# Patient Record
Sex: Female | Born: 1950
Health system: Southern US, Community
[De-identification: ages and names within clinical notes are randomized; demographics above are authoritative.]

## PROBLEM LIST (undated history)

## (undated) DIAGNOSIS — H02889 Meibomian gland dysfunction of unspecified eye, unspecified eyelid: Secondary | ICD-10-CM

## (undated) DIAGNOSIS — K219 Gastro-esophageal reflux disease without esophagitis: Secondary | ICD-10-CM

## (undated) DIAGNOSIS — R011 Cardiac murmur, unspecified: Secondary | ICD-10-CM

## (undated) DIAGNOSIS — R51 Headache: Secondary | ICD-10-CM

## (undated) DIAGNOSIS — M26629 Arthralgia of temporomandibular joint, unspecified side: Secondary | ICD-10-CM

## (undated) DIAGNOSIS — H348192 Central retinal vein occlusion, unspecified eye, stable: Secondary | ICD-10-CM

## (undated) DIAGNOSIS — M1990 Unspecified osteoarthritis, unspecified site: Secondary | ICD-10-CM

## (undated) DIAGNOSIS — R519 Headache, unspecified: Secondary | ICD-10-CM

## (undated) DIAGNOSIS — G4733 Obstructive sleep apnea (adult) (pediatric): Secondary | ICD-10-CM

## (undated) DIAGNOSIS — H04129 Dry eye syndrome of unspecified lacrimal gland: Secondary | ICD-10-CM

## (undated) DIAGNOSIS — E039 Hypothyroidism, unspecified: Secondary | ICD-10-CM

## (undated) DIAGNOSIS — H905 Unspecified sensorineural hearing loss: Secondary | ICD-10-CM

## (undated) DIAGNOSIS — J329 Chronic sinusitis, unspecified: Secondary | ICD-10-CM

## (undated) HISTORY — DX: Obstructive sleep apnea (adult) (pediatric): G47.33

## (undated) HISTORY — PX: CYST EXCISION: SHX5701

## (undated) HISTORY — DX: Meibomian gland dysfunction of unspecified eye, unspecified eyelid: H02.889

## (undated) HISTORY — PX: TUBAL LIGATION: SHX77

## (undated) HISTORY — PX: HAMMER TOE SURGERY: SHX385

## (undated) HISTORY — DX: Gastro-esophageal reflux disease without esophagitis: K21.9

## (undated) HISTORY — PX: COLONOSCOPY: SHX174

## (undated) HISTORY — DX: Arthralgia of temporomandibular joint, unspecified side: M26.629

## (undated) HISTORY — DX: Cardiac murmur, unspecified: R01.1

## (undated) HISTORY — PX: EYE SURGERY: SHX253

## (undated) HISTORY — DX: Unspecified sensorineural hearing loss: H90.5

## (undated) HISTORY — DX: Central retinal vein occlusion, unspecified eye, stable: H34.8192

## (undated) HISTORY — PX: CHOLECYSTECTOMY: SHX55

## (undated) HISTORY — DX: Dry eye syndrome of unspecified lacrimal gland: H04.129

## (undated) HISTORY — PX: NASAL/SINUS ENDOSCOPY: SHX288

---

## 1989-11-14 HISTORY — PX: OTHER SURGICAL HISTORY: SHX169

## 1999-11-15 HISTORY — PX: CARPAL TUNNEL RELEASE: SHX101

## 2011-04-25 ENCOUNTER — Encounter: Payer: 59 | Attending: Physical Medicine & Rehabilitation

## 2011-04-25 ENCOUNTER — Ambulatory Visit (HOSPITAL_BASED_OUTPATIENT_CLINIC_OR_DEPARTMENT_OTHER): Payer: 59 | Admitting: Physical Medicine & Rehabilitation

## 2011-04-25 DIAGNOSIS — J329 Chronic sinusitis, unspecified: Secondary | ICD-10-CM | POA: Insufficient documentation

## 2011-04-25 DIAGNOSIS — R209 Unspecified disturbances of skin sensation: Secondary | ICD-10-CM | POA: Insufficient documentation

## 2011-04-25 DIAGNOSIS — E039 Hypothyroidism, unspecified: Secondary | ICD-10-CM | POA: Insufficient documentation

## 2011-04-25 DIAGNOSIS — Z79899 Other long term (current) drug therapy: Secondary | ICD-10-CM | POA: Insufficient documentation

## 2011-04-25 DIAGNOSIS — Z9089 Acquired absence of other organs: Secondary | ICD-10-CM | POA: Insufficient documentation

## 2011-04-25 DIAGNOSIS — R262 Difficulty in walking, not elsewhere classified: Secondary | ICD-10-CM | POA: Insufficient documentation

## 2011-06-28 ENCOUNTER — Encounter: Payer: 59 | Attending: Physical Medicine & Rehabilitation

## 2011-06-28 ENCOUNTER — Ambulatory Visit (HOSPITAL_BASED_OUTPATIENT_CLINIC_OR_DEPARTMENT_OTHER): Payer: 59 | Admitting: Physical Medicine & Rehabilitation

## 2011-06-28 DIAGNOSIS — G5610 Other lesions of median nerve, unspecified upper limb: Secondary | ICD-10-CM

## 2011-06-28 DIAGNOSIS — R262 Difficulty in walking, not elsewhere classified: Secondary | ICD-10-CM | POA: Insufficient documentation

## 2011-06-28 DIAGNOSIS — Z9089 Acquired absence of other organs: Secondary | ICD-10-CM | POA: Insufficient documentation

## 2011-06-28 DIAGNOSIS — Z79899 Other long term (current) drug therapy: Secondary | ICD-10-CM | POA: Insufficient documentation

## 2011-06-28 DIAGNOSIS — E039 Hypothyroidism, unspecified: Secondary | ICD-10-CM | POA: Insufficient documentation

## 2011-06-28 DIAGNOSIS — J329 Chronic sinusitis, unspecified: Secondary | ICD-10-CM | POA: Insufficient documentation

## 2011-06-28 DIAGNOSIS — G5620 Lesion of ulnar nerve, unspecified upper limb: Secondary | ICD-10-CM

## 2011-06-28 DIAGNOSIS — R209 Unspecified disturbances of skin sensation: Secondary | ICD-10-CM | POA: Insufficient documentation

## 2016-01-19 DIAGNOSIS — G243 Spasmodic torticollis: Secondary | ICD-10-CM | POA: Diagnosis not present

## 2016-01-19 DIAGNOSIS — M47812 Spondylosis without myelopathy or radiculopathy, cervical region: Secondary | ICD-10-CM | POA: Diagnosis not present

## 2016-01-19 DIAGNOSIS — M9981 Other biomechanical lesions of cervical region: Secondary | ICD-10-CM | POA: Diagnosis not present

## 2016-01-19 DIAGNOSIS — M5030 Other cervical disc degeneration, unspecified cervical region: Secondary | ICD-10-CM | POA: Diagnosis not present

## 2016-01-25 DIAGNOSIS — G243 Spasmodic torticollis: Secondary | ICD-10-CM | POA: Diagnosis not present

## 2016-01-25 DIAGNOSIS — M5030 Other cervical disc degeneration, unspecified cervical region: Secondary | ICD-10-CM | POA: Diagnosis not present

## 2016-01-25 DIAGNOSIS — Z833 Family history of diabetes mellitus: Secondary | ICD-10-CM | POA: Diagnosis not present

## 2016-01-25 DIAGNOSIS — K219 Gastro-esophageal reflux disease without esophagitis: Secondary | ICD-10-CM | POA: Diagnosis not present

## 2016-01-25 DIAGNOSIS — Z79899 Other long term (current) drug therapy: Secondary | ICD-10-CM | POA: Diagnosis not present

## 2016-01-25 DIAGNOSIS — M47812 Spondylosis without myelopathy or radiculopathy, cervical region: Secondary | ICD-10-CM | POA: Diagnosis not present

## 2016-01-25 DIAGNOSIS — M9981 Other biomechanical lesions of cervical region: Secondary | ICD-10-CM | POA: Diagnosis not present

## 2016-01-25 DIAGNOSIS — Z7982 Long term (current) use of aspirin: Secondary | ICD-10-CM | POA: Diagnosis not present

## 2016-01-25 DIAGNOSIS — Z823 Family history of stroke: Secondary | ICD-10-CM | POA: Diagnosis not present

## 2016-01-25 DIAGNOSIS — Z883 Allergy status to other anti-infective agents status: Secondary | ICD-10-CM | POA: Diagnosis not present

## 2016-02-18 DIAGNOSIS — M5030 Other cervical disc degeneration, unspecified cervical region: Secondary | ICD-10-CM | POA: Diagnosis not present

## 2016-02-18 DIAGNOSIS — M47812 Spondylosis without myelopathy or radiculopathy, cervical region: Secondary | ICD-10-CM | POA: Diagnosis not present

## 2016-03-23 DIAGNOSIS — M7062 Trochanteric bursitis, left hip: Secondary | ICD-10-CM | POA: Diagnosis not present

## 2016-03-23 DIAGNOSIS — M3130 Wegener's granulomatosis without renal involvement: Secondary | ICD-10-CM | POA: Diagnosis not present

## 2016-03-23 DIAGNOSIS — Z23 Encounter for immunization: Secondary | ICD-10-CM | POA: Diagnosis not present

## 2016-03-23 DIAGNOSIS — M7061 Trochanteric bursitis, right hip: Secondary | ICD-10-CM | POA: Diagnosis not present

## 2016-04-22 DIAGNOSIS — M7061 Trochanteric bursitis, right hip: Secondary | ICD-10-CM | POA: Diagnosis not present

## 2016-04-22 DIAGNOSIS — M7062 Trochanteric bursitis, left hip: Secondary | ICD-10-CM | POA: Diagnosis not present

## 2016-04-22 DIAGNOSIS — M47811 Spondylosis without myelopathy or radiculopathy, occipito-atlanto-axial region: Secondary | ICD-10-CM | POA: Diagnosis not present

## 2016-04-25 DIAGNOSIS — M7061 Trochanteric bursitis, right hip: Secondary | ICD-10-CM | POA: Diagnosis not present

## 2016-04-25 DIAGNOSIS — M4306 Spondylolysis, lumbar region: Secondary | ICD-10-CM | POA: Diagnosis not present

## 2016-04-25 DIAGNOSIS — M7062 Trochanteric bursitis, left hip: Secondary | ICD-10-CM | POA: Diagnosis not present

## 2016-04-27 DIAGNOSIS — M7062 Trochanteric bursitis, left hip: Secondary | ICD-10-CM | POA: Diagnosis not present

## 2016-04-27 DIAGNOSIS — M7061 Trochanteric bursitis, right hip: Secondary | ICD-10-CM | POA: Diagnosis not present

## 2016-04-27 DIAGNOSIS — M4306 Spondylolysis, lumbar region: Secondary | ICD-10-CM | POA: Diagnosis not present

## 2016-04-28 DIAGNOSIS — E039 Hypothyroidism, unspecified: Secondary | ICD-10-CM | POA: Diagnosis not present

## 2016-04-28 DIAGNOSIS — J310 Chronic rhinitis: Secondary | ICD-10-CM | POA: Diagnosis not present

## 2016-04-28 DIAGNOSIS — J328 Other chronic sinusitis: Secondary | ICD-10-CM | POA: Diagnosis not present

## 2016-04-28 DIAGNOSIS — Z881 Allergy status to other antibiotic agents status: Secondary | ICD-10-CM | POA: Diagnosis not present

## 2016-04-28 DIAGNOSIS — J329 Chronic sinusitis, unspecified: Secondary | ICD-10-CM | POA: Diagnosis not present

## 2016-04-28 DIAGNOSIS — Z79899 Other long term (current) drug therapy: Secondary | ICD-10-CM | POA: Diagnosis not present

## 2016-04-28 DIAGNOSIS — Z7982 Long term (current) use of aspirin: Secondary | ICD-10-CM | POA: Diagnosis not present

## 2016-04-28 DIAGNOSIS — K219 Gastro-esophageal reflux disease without esophagitis: Secondary | ICD-10-CM | POA: Diagnosis not present

## 2016-05-02 DIAGNOSIS — M4306 Spondylolysis, lumbar region: Secondary | ICD-10-CM | POA: Diagnosis not present

## 2016-05-02 DIAGNOSIS — M7062 Trochanteric bursitis, left hip: Secondary | ICD-10-CM | POA: Diagnosis not present

## 2016-05-02 DIAGNOSIS — M7061 Trochanteric bursitis, right hip: Secondary | ICD-10-CM | POA: Diagnosis not present

## 2016-05-05 DIAGNOSIS — M4306 Spondylolysis, lumbar region: Secondary | ICD-10-CM | POA: Diagnosis not present

## 2016-05-05 DIAGNOSIS — M7061 Trochanteric bursitis, right hip: Secondary | ICD-10-CM | POA: Diagnosis not present

## 2016-05-05 DIAGNOSIS — M7062 Trochanteric bursitis, left hip: Secondary | ICD-10-CM | POA: Diagnosis not present

## 2016-05-09 DIAGNOSIS — M4306 Spondylolysis, lumbar region: Secondary | ICD-10-CM | POA: Diagnosis not present

## 2016-05-09 DIAGNOSIS — M7061 Trochanteric bursitis, right hip: Secondary | ICD-10-CM | POA: Diagnosis not present

## 2016-05-09 DIAGNOSIS — M7062 Trochanteric bursitis, left hip: Secondary | ICD-10-CM | POA: Diagnosis not present

## 2016-05-12 DIAGNOSIS — M4306 Spondylolysis, lumbar region: Secondary | ICD-10-CM | POA: Diagnosis not present

## 2016-05-12 DIAGNOSIS — M7061 Trochanteric bursitis, right hip: Secondary | ICD-10-CM | POA: Diagnosis not present

## 2016-05-12 DIAGNOSIS — M7062 Trochanteric bursitis, left hip: Secondary | ICD-10-CM | POA: Diagnosis not present

## 2016-05-16 DIAGNOSIS — M4306 Spondylolysis, lumbar region: Secondary | ICD-10-CM | POA: Diagnosis not present

## 2016-05-16 DIAGNOSIS — M7061 Trochanteric bursitis, right hip: Secondary | ICD-10-CM | POA: Diagnosis not present

## 2016-05-16 DIAGNOSIS — M7062 Trochanteric bursitis, left hip: Secondary | ICD-10-CM | POA: Diagnosis not present

## 2016-05-19 DIAGNOSIS — M7062 Trochanteric bursitis, left hip: Secondary | ICD-10-CM | POA: Diagnosis not present

## 2016-05-19 DIAGNOSIS — M4306 Spondylolysis, lumbar region: Secondary | ICD-10-CM | POA: Diagnosis not present

## 2016-05-19 DIAGNOSIS — M7061 Trochanteric bursitis, right hip: Secondary | ICD-10-CM | POA: Diagnosis not present

## 2016-05-23 DIAGNOSIS — M7062 Trochanteric bursitis, left hip: Secondary | ICD-10-CM | POA: Diagnosis not present

## 2016-05-23 DIAGNOSIS — M4306 Spondylolysis, lumbar region: Secondary | ICD-10-CM | POA: Diagnosis not present

## 2016-05-23 DIAGNOSIS — M7061 Trochanteric bursitis, right hip: Secondary | ICD-10-CM | POA: Diagnosis not present

## 2016-05-26 DIAGNOSIS — M4306 Spondylolysis, lumbar region: Secondary | ICD-10-CM | POA: Diagnosis not present

## 2016-05-26 DIAGNOSIS — M7062 Trochanteric bursitis, left hip: Secondary | ICD-10-CM | POA: Diagnosis not present

## 2016-05-26 DIAGNOSIS — M7061 Trochanteric bursitis, right hip: Secondary | ICD-10-CM | POA: Diagnosis not present

## 2016-05-31 DIAGNOSIS — M7062 Trochanteric bursitis, left hip: Secondary | ICD-10-CM | POA: Diagnosis not present

## 2016-05-31 DIAGNOSIS — M4306 Spondylolysis, lumbar region: Secondary | ICD-10-CM | POA: Diagnosis not present

## 2016-05-31 DIAGNOSIS — M7061 Trochanteric bursitis, right hip: Secondary | ICD-10-CM | POA: Diagnosis not present

## 2016-06-03 DIAGNOSIS — M7061 Trochanteric bursitis, right hip: Secondary | ICD-10-CM | POA: Diagnosis not present

## 2016-06-03 DIAGNOSIS — M7062 Trochanteric bursitis, left hip: Secondary | ICD-10-CM | POA: Diagnosis not present

## 2016-06-03 DIAGNOSIS — M4306 Spondylolysis, lumbar region: Secondary | ICD-10-CM | POA: Diagnosis not present

## 2016-06-20 DIAGNOSIS — E039 Hypothyroidism, unspecified: Secondary | ICD-10-CM | POA: Diagnosis not present

## 2016-06-20 DIAGNOSIS — E782 Mixed hyperlipidemia: Secondary | ICD-10-CM | POA: Diagnosis not present

## 2016-06-20 DIAGNOSIS — K210 Gastro-esophageal reflux disease with esophagitis: Secondary | ICD-10-CM | POA: Diagnosis not present

## 2016-06-23 DIAGNOSIS — Z6825 Body mass index (BMI) 25.0-25.9, adult: Secondary | ICD-10-CM | POA: Diagnosis not present

## 2016-06-23 DIAGNOSIS — M3130 Wegener's granulomatosis without renal involvement: Secondary | ICD-10-CM | POA: Diagnosis not present

## 2016-06-23 DIAGNOSIS — M7061 Trochanteric bursitis, right hip: Secondary | ICD-10-CM | POA: Diagnosis not present

## 2016-06-23 DIAGNOSIS — E039 Hypothyroidism, unspecified: Secondary | ICD-10-CM | POA: Diagnosis not present

## 2016-06-23 DIAGNOSIS — Z0001 Encounter for general adult medical examination with abnormal findings: Secondary | ICD-10-CM | POA: Diagnosis not present

## 2016-06-23 DIAGNOSIS — Z1212 Encounter for screening for malignant neoplasm of rectum: Secondary | ICD-10-CM | POA: Diagnosis not present

## 2016-06-23 DIAGNOSIS — R300 Dysuria: Secondary | ICD-10-CM | POA: Diagnosis not present

## 2016-06-23 DIAGNOSIS — M7062 Trochanteric bursitis, left hip: Secondary | ICD-10-CM | POA: Diagnosis not present

## 2016-08-17 ENCOUNTER — Ambulatory Visit (INDEPENDENT_AMBULATORY_CARE_PROVIDER_SITE_OTHER): Payer: Medicare Other

## 2016-08-17 DIAGNOSIS — Z23 Encounter for immunization: Secondary | ICD-10-CM | POA: Diagnosis not present

## 2016-08-24 DIAGNOSIS — M7061 Trochanteric bursitis, right hip: Secondary | ICD-10-CM | POA: Diagnosis not present

## 2016-08-24 DIAGNOSIS — M7062 Trochanteric bursitis, left hip: Secondary | ICD-10-CM | POA: Diagnosis not present

## 2016-08-24 DIAGNOSIS — Z6825 Body mass index (BMI) 25.0-25.9, adult: Secondary | ICD-10-CM | POA: Diagnosis not present

## 2016-08-30 DIAGNOSIS — H11441 Conjunctival cysts, right eye: Secondary | ICD-10-CM | POA: Diagnosis not present

## 2016-09-26 DIAGNOSIS — Z01419 Encounter for gynecological examination (general) (routine) without abnormal findings: Secondary | ICD-10-CM | POA: Diagnosis not present

## 2016-09-26 DIAGNOSIS — Z6825 Body mass index (BMI) 25.0-25.9, adult: Secondary | ICD-10-CM | POA: Diagnosis not present

## 2016-10-20 DIAGNOSIS — Z1231 Encounter for screening mammogram for malignant neoplasm of breast: Secondary | ICD-10-CM | POA: Diagnosis not present

## 2016-10-24 DIAGNOSIS — M7062 Trochanteric bursitis, left hip: Secondary | ICD-10-CM | POA: Diagnosis not present

## 2016-10-24 DIAGNOSIS — Z6825 Body mass index (BMI) 25.0-25.9, adult: Secondary | ICD-10-CM | POA: Diagnosis not present

## 2016-10-24 DIAGNOSIS — E039 Hypothyroidism, unspecified: Secondary | ICD-10-CM | POA: Diagnosis not present

## 2016-10-24 DIAGNOSIS — M3130 Wegener's granulomatosis without renal involvement: Secondary | ICD-10-CM | POA: Diagnosis not present

## 2016-10-24 DIAGNOSIS — Z23 Encounter for immunization: Secondary | ICD-10-CM | POA: Diagnosis not present

## 2016-10-24 DIAGNOSIS — Z1389 Encounter for screening for other disorder: Secondary | ICD-10-CM | POA: Diagnosis not present

## 2016-10-24 DIAGNOSIS — M7061 Trochanteric bursitis, right hip: Secondary | ICD-10-CM | POA: Diagnosis not present

## 2016-10-24 DIAGNOSIS — E782 Mixed hyperlipidemia: Secondary | ICD-10-CM | POA: Diagnosis not present

## 2016-10-27 DIAGNOSIS — J300 Vasomotor rhinitis: Secondary | ICD-10-CM | POA: Diagnosis not present

## 2016-10-27 DIAGNOSIS — Z79899 Other long term (current) drug therapy: Secondary | ICD-10-CM | POA: Diagnosis not present

## 2016-10-27 DIAGNOSIS — R0982 Postnasal drip: Secondary | ICD-10-CM | POA: Diagnosis not present

## 2016-10-27 DIAGNOSIS — K219 Gastro-esophageal reflux disease without esophagitis: Secondary | ICD-10-CM | POA: Diagnosis not present

## 2016-10-27 DIAGNOSIS — J328 Other chronic sinusitis: Secondary | ICD-10-CM | POA: Diagnosis not present

## 2016-10-27 DIAGNOSIS — Z7982 Long term (current) use of aspirin: Secondary | ICD-10-CM | POA: Diagnosis not present

## 2016-10-27 DIAGNOSIS — J310 Chronic rhinitis: Secondary | ICD-10-CM | POA: Diagnosis not present

## 2016-11-15 DIAGNOSIS — H5213 Myopia, bilateral: Secondary | ICD-10-CM | POA: Diagnosis not present

## 2016-11-15 DIAGNOSIS — H2513 Age-related nuclear cataract, bilateral: Secondary | ICD-10-CM | POA: Diagnosis not present

## 2016-11-15 DIAGNOSIS — H52203 Unspecified astigmatism, bilateral: Secondary | ICD-10-CM | POA: Diagnosis not present

## 2016-11-15 DIAGNOSIS — H524 Presbyopia: Secondary | ICD-10-CM | POA: Diagnosis not present

## 2016-11-15 DIAGNOSIS — H348312 Tributary (branch) retinal vein occlusion, right eye, stable: Secondary | ICD-10-CM | POA: Diagnosis not present

## 2016-11-16 DIAGNOSIS — H35041 Retinal micro-aneurysms, unspecified, right eye: Secondary | ICD-10-CM | POA: Diagnosis not present

## 2016-11-16 DIAGNOSIS — H35351 Cystoid macular degeneration, right eye: Secondary | ICD-10-CM | POA: Diagnosis not present

## 2016-11-16 DIAGNOSIS — H3561 Retinal hemorrhage, right eye: Secondary | ICD-10-CM | POA: Diagnosis not present

## 2016-11-16 DIAGNOSIS — H34811 Central retinal vein occlusion, right eye, with macular edema: Secondary | ICD-10-CM | POA: Diagnosis not present

## 2016-12-19 DIAGNOSIS — H34811 Central retinal vein occlusion, right eye, with macular edema: Secondary | ICD-10-CM | POA: Diagnosis not present

## 2017-01-23 DIAGNOSIS — H34811 Central retinal vein occlusion, right eye, with macular edema: Secondary | ICD-10-CM | POA: Diagnosis not present

## 2017-01-26 DIAGNOSIS — E0590 Thyrotoxicosis, unspecified without thyrotoxic crisis or storm: Secondary | ICD-10-CM | POA: Diagnosis not present

## 2017-01-26 DIAGNOSIS — Z79899 Other long term (current) drug therapy: Secondary | ICD-10-CM | POA: Diagnosis not present

## 2017-01-26 DIAGNOSIS — M810 Age-related osteoporosis without current pathological fracture: Secondary | ICD-10-CM | POA: Diagnosis not present

## 2017-01-26 DIAGNOSIS — Z780 Asymptomatic menopausal state: Secondary | ICD-10-CM | POA: Diagnosis not present

## 2017-01-26 DIAGNOSIS — Z79890 Hormone replacement therapy (postmenopausal): Secondary | ICD-10-CM | POA: Diagnosis not present

## 2017-01-26 DIAGNOSIS — K219 Gastro-esophageal reflux disease without esophagitis: Secondary | ICD-10-CM | POA: Diagnosis not present

## 2017-01-26 DIAGNOSIS — Z7982 Long term (current) use of aspirin: Secondary | ICD-10-CM | POA: Diagnosis not present

## 2017-01-26 DIAGNOSIS — M542 Cervicalgia: Secondary | ICD-10-CM | POA: Diagnosis not present

## 2017-02-13 DIAGNOSIS — M1991 Primary osteoarthritis, unspecified site: Secondary | ICD-10-CM | POA: Diagnosis not present

## 2017-02-13 DIAGNOSIS — E039 Hypothyroidism, unspecified: Secondary | ICD-10-CM | POA: Diagnosis not present

## 2017-02-13 DIAGNOSIS — Z9189 Other specified personal risk factors, not elsewhere classified: Secondary | ICD-10-CM | POA: Diagnosis not present

## 2017-02-13 DIAGNOSIS — M3130 Wegener's granulomatosis without renal involvement: Secondary | ICD-10-CM | POA: Diagnosis not present

## 2017-02-13 DIAGNOSIS — E782 Mixed hyperlipidemia: Secondary | ICD-10-CM | POA: Diagnosis not present

## 2017-02-13 DIAGNOSIS — N183 Chronic kidney disease, stage 3 (moderate): Secondary | ICD-10-CM | POA: Diagnosis not present

## 2017-02-13 DIAGNOSIS — K210 Gastro-esophageal reflux disease with esophagitis: Secondary | ICD-10-CM | POA: Diagnosis not present

## 2017-02-15 DIAGNOSIS — M7061 Trochanteric bursitis, right hip: Secondary | ICD-10-CM | POA: Diagnosis not present

## 2017-02-15 DIAGNOSIS — E782 Mixed hyperlipidemia: Secondary | ICD-10-CM | POA: Diagnosis not present

## 2017-02-15 DIAGNOSIS — M7062 Trochanteric bursitis, left hip: Secondary | ICD-10-CM | POA: Diagnosis not present

## 2017-02-15 DIAGNOSIS — G43909 Migraine, unspecified, not intractable, without status migrainosus: Secondary | ICD-10-CM | POA: Diagnosis not present

## 2017-02-15 DIAGNOSIS — E039 Hypothyroidism, unspecified: Secondary | ICD-10-CM | POA: Diagnosis not present

## 2017-02-15 DIAGNOSIS — Z23 Encounter for immunization: Secondary | ICD-10-CM | POA: Diagnosis not present

## 2017-02-15 DIAGNOSIS — M797 Fibromyalgia: Secondary | ICD-10-CM | POA: Diagnosis not present

## 2017-02-15 DIAGNOSIS — M3130 Wegener's granulomatosis without renal involvement: Secondary | ICD-10-CM | POA: Diagnosis not present

## 2017-02-27 DIAGNOSIS — H35351 Cystoid macular degeneration, right eye: Secondary | ICD-10-CM | POA: Diagnosis not present

## 2017-02-27 DIAGNOSIS — H34811 Central retinal vein occlusion, right eye, with macular edema: Secondary | ICD-10-CM | POA: Diagnosis not present

## 2017-04-03 DIAGNOSIS — H35041 Retinal micro-aneurysms, unspecified, right eye: Secondary | ICD-10-CM | POA: Diagnosis not present

## 2017-04-03 DIAGNOSIS — H3561 Retinal hemorrhage, right eye: Secondary | ICD-10-CM | POA: Diagnosis not present

## 2017-04-03 DIAGNOSIS — H34811 Central retinal vein occlusion, right eye, with macular edema: Secondary | ICD-10-CM | POA: Diagnosis not present

## 2017-04-03 DIAGNOSIS — H35351 Cystoid macular degeneration, right eye: Secondary | ICD-10-CM | POA: Diagnosis not present

## 2017-04-03 DIAGNOSIS — H34821 Venous engorgement, right eye: Secondary | ICD-10-CM | POA: Diagnosis not present

## 2017-04-11 DIAGNOSIS — Z6825 Body mass index (BMI) 25.0-25.9, adult: Secondary | ICD-10-CM | POA: Diagnosis not present

## 2017-04-11 DIAGNOSIS — M19012 Primary osteoarthritis, left shoulder: Secondary | ICD-10-CM | POA: Diagnosis not present

## 2017-04-18 DIAGNOSIS — M19012 Primary osteoarthritis, left shoulder: Secondary | ICD-10-CM | POA: Diagnosis not present

## 2017-04-18 DIAGNOSIS — M25512 Pain in left shoulder: Secondary | ICD-10-CM | POA: Diagnosis not present

## 2017-04-18 DIAGNOSIS — M4306 Spondylolysis, lumbar region: Secondary | ICD-10-CM | POA: Diagnosis not present

## 2017-04-21 DIAGNOSIS — M25512 Pain in left shoulder: Secondary | ICD-10-CM | POA: Diagnosis not present

## 2017-04-21 DIAGNOSIS — M4306 Spondylolysis, lumbar region: Secondary | ICD-10-CM | POA: Diagnosis not present

## 2017-04-21 DIAGNOSIS — M19012 Primary osteoarthritis, left shoulder: Secondary | ICD-10-CM | POA: Diagnosis not present

## 2017-04-24 DIAGNOSIS — M25512 Pain in left shoulder: Secondary | ICD-10-CM | POA: Diagnosis not present

## 2017-04-24 DIAGNOSIS — M19012 Primary osteoarthritis, left shoulder: Secondary | ICD-10-CM | POA: Diagnosis not present

## 2017-04-24 DIAGNOSIS — M4306 Spondylolysis, lumbar region: Secondary | ICD-10-CM | POA: Diagnosis not present

## 2017-04-27 DIAGNOSIS — M25512 Pain in left shoulder: Secondary | ICD-10-CM | POA: Diagnosis not present

## 2017-04-27 DIAGNOSIS — M4306 Spondylolysis, lumbar region: Secondary | ICD-10-CM | POA: Diagnosis not present

## 2017-04-27 DIAGNOSIS — M19012 Primary osteoarthritis, left shoulder: Secondary | ICD-10-CM | POA: Diagnosis not present

## 2017-05-01 DIAGNOSIS — M19012 Primary osteoarthritis, left shoulder: Secondary | ICD-10-CM | POA: Diagnosis not present

## 2017-05-01 DIAGNOSIS — M4306 Spondylolysis, lumbar region: Secondary | ICD-10-CM | POA: Diagnosis not present

## 2017-05-01 DIAGNOSIS — M25512 Pain in left shoulder: Secondary | ICD-10-CM | POA: Diagnosis not present

## 2017-05-04 DIAGNOSIS — M25512 Pain in left shoulder: Secondary | ICD-10-CM | POA: Diagnosis not present

## 2017-05-04 DIAGNOSIS — R05 Cough: Secondary | ICD-10-CM | POA: Diagnosis not present

## 2017-05-04 DIAGNOSIS — Z6825 Body mass index (BMI) 25.0-25.9, adult: Secondary | ICD-10-CM | POA: Diagnosis not present

## 2017-05-04 DIAGNOSIS — M19012 Primary osteoarthritis, left shoulder: Secondary | ICD-10-CM | POA: Diagnosis not present

## 2017-05-04 DIAGNOSIS — M4306 Spondylolysis, lumbar region: Secondary | ICD-10-CM | POA: Diagnosis not present

## 2017-05-15 DIAGNOSIS — M19012 Primary osteoarthritis, left shoulder: Secondary | ICD-10-CM | POA: Diagnosis not present

## 2017-05-15 DIAGNOSIS — M4306 Spondylolysis, lumbar region: Secondary | ICD-10-CM | POA: Diagnosis not present

## 2017-05-15 DIAGNOSIS — M25512 Pain in left shoulder: Secondary | ICD-10-CM | POA: Diagnosis not present

## 2017-05-16 DIAGNOSIS — J328 Other chronic sinusitis: Secondary | ICD-10-CM | POA: Diagnosis not present

## 2017-05-16 DIAGNOSIS — K219 Gastro-esophageal reflux disease without esophagitis: Secondary | ICD-10-CM | POA: Diagnosis not present

## 2017-05-16 DIAGNOSIS — R0982 Postnasal drip: Secondary | ICD-10-CM | POA: Diagnosis not present

## 2017-05-16 DIAGNOSIS — J310 Chronic rhinitis: Secondary | ICD-10-CM | POA: Diagnosis not present

## 2017-05-18 DIAGNOSIS — M19012 Primary osteoarthritis, left shoulder: Secondary | ICD-10-CM | POA: Diagnosis not present

## 2017-05-18 DIAGNOSIS — M25512 Pain in left shoulder: Secondary | ICD-10-CM | POA: Diagnosis not present

## 2017-05-18 DIAGNOSIS — M4306 Spondylolysis, lumbar region: Secondary | ICD-10-CM | POA: Diagnosis not present

## 2017-05-22 DIAGNOSIS — M4306 Spondylolysis, lumbar region: Secondary | ICD-10-CM | POA: Diagnosis not present

## 2017-05-22 DIAGNOSIS — M19012 Primary osteoarthritis, left shoulder: Secondary | ICD-10-CM | POA: Diagnosis not present

## 2017-05-22 DIAGNOSIS — M25512 Pain in left shoulder: Secondary | ICD-10-CM | POA: Diagnosis not present

## 2017-05-25 DIAGNOSIS — M4306 Spondylolysis, lumbar region: Secondary | ICD-10-CM | POA: Diagnosis not present

## 2017-05-25 DIAGNOSIS — M25512 Pain in left shoulder: Secondary | ICD-10-CM | POA: Diagnosis not present

## 2017-05-25 DIAGNOSIS — M19012 Primary osteoarthritis, left shoulder: Secondary | ICD-10-CM | POA: Diagnosis not present

## 2017-05-29 DIAGNOSIS — H3561 Retinal hemorrhage, right eye: Secondary | ICD-10-CM | POA: Diagnosis not present

## 2017-05-29 DIAGNOSIS — H35041 Retinal micro-aneurysms, unspecified, right eye: Secondary | ICD-10-CM | POA: Diagnosis not present

## 2017-05-29 DIAGNOSIS — H348112 Central retinal vein occlusion, right eye, stable: Secondary | ICD-10-CM | POA: Diagnosis not present

## 2017-05-29 DIAGNOSIS — H35351 Cystoid macular degeneration, right eye: Secondary | ICD-10-CM | POA: Diagnosis not present

## 2017-05-29 DIAGNOSIS — H43811 Vitreous degeneration, right eye: Secondary | ICD-10-CM | POA: Diagnosis not present

## 2017-05-29 DIAGNOSIS — H34821 Venous engorgement, right eye: Secondary | ICD-10-CM | POA: Diagnosis not present

## 2017-05-30 DIAGNOSIS — M19012 Primary osteoarthritis, left shoulder: Secondary | ICD-10-CM | POA: Diagnosis not present

## 2017-05-30 DIAGNOSIS — M4306 Spondylolysis, lumbar region: Secondary | ICD-10-CM | POA: Diagnosis not present

## 2017-05-30 DIAGNOSIS — M25512 Pain in left shoulder: Secondary | ICD-10-CM | POA: Diagnosis not present

## 2017-06-01 DIAGNOSIS — M25512 Pain in left shoulder: Secondary | ICD-10-CM | POA: Diagnosis not present

## 2017-06-01 DIAGNOSIS — M4306 Spondylolysis, lumbar region: Secondary | ICD-10-CM | POA: Diagnosis not present

## 2017-06-01 DIAGNOSIS — M19012 Primary osteoarthritis, left shoulder: Secondary | ICD-10-CM | POA: Diagnosis not present

## 2017-06-05 DIAGNOSIS — M25512 Pain in left shoulder: Secondary | ICD-10-CM | POA: Diagnosis not present

## 2017-06-05 DIAGNOSIS — Z6825 Body mass index (BMI) 25.0-25.9, adult: Secondary | ICD-10-CM | POA: Diagnosis not present

## 2017-06-05 DIAGNOSIS — M50322 Other cervical disc degeneration at C5-C6 level: Secondary | ICD-10-CM | POA: Diagnosis not present

## 2017-06-09 DIAGNOSIS — M25512 Pain in left shoulder: Secondary | ICD-10-CM | POA: Diagnosis not present

## 2017-06-09 DIAGNOSIS — R937 Abnormal findings on diagnostic imaging of other parts of musculoskeletal system: Secondary | ICD-10-CM | POA: Diagnosis not present

## 2017-06-22 DIAGNOSIS — H00022 Hordeolum internum right lower eyelid: Secondary | ICD-10-CM | POA: Diagnosis not present

## 2017-06-26 DIAGNOSIS — H35041 Retinal micro-aneurysms, unspecified, right eye: Secondary | ICD-10-CM | POA: Diagnosis not present

## 2017-06-26 DIAGNOSIS — H35351 Cystoid macular degeneration, right eye: Secondary | ICD-10-CM | POA: Diagnosis not present

## 2017-06-26 DIAGNOSIS — H348112 Central retinal vein occlusion, right eye, stable: Secondary | ICD-10-CM | POA: Diagnosis not present

## 2017-06-26 DIAGNOSIS — H3561 Retinal hemorrhage, right eye: Secondary | ICD-10-CM | POA: Diagnosis not present

## 2017-07-06 DIAGNOSIS — Z0001 Encounter for general adult medical examination with abnormal findings: Secondary | ICD-10-CM | POA: Diagnosis not present

## 2017-07-06 DIAGNOSIS — Z6826 Body mass index (BMI) 26.0-26.9, adult: Secondary | ICD-10-CM | POA: Diagnosis not present

## 2017-07-06 DIAGNOSIS — Z9189 Other specified personal risk factors, not elsewhere classified: Secondary | ICD-10-CM | POA: Diagnosis not present

## 2017-07-06 DIAGNOSIS — N183 Chronic kidney disease, stage 3 (moderate): Secondary | ICD-10-CM | POA: Diagnosis not present

## 2017-07-06 DIAGNOSIS — Z720 Tobacco use: Secondary | ICD-10-CM | POA: Diagnosis not present

## 2017-07-06 DIAGNOSIS — K210 Gastro-esophageal reflux disease with esophagitis: Secondary | ICD-10-CM | POA: Diagnosis not present

## 2017-07-06 DIAGNOSIS — E782 Mixed hyperlipidemia: Secondary | ICD-10-CM | POA: Diagnosis not present

## 2017-07-06 DIAGNOSIS — K219 Gastro-esophageal reflux disease without esophagitis: Secondary | ICD-10-CM | POA: Diagnosis not present

## 2017-07-10 DIAGNOSIS — E782 Mixed hyperlipidemia: Secondary | ICD-10-CM | POA: Diagnosis not present

## 2017-07-10 DIAGNOSIS — E039 Hypothyroidism, unspecified: Secondary | ICD-10-CM | POA: Diagnosis not present

## 2017-07-10 DIAGNOSIS — G43909 Migraine, unspecified, not intractable, without status migrainosus: Secondary | ICD-10-CM | POA: Diagnosis not present

## 2017-07-10 DIAGNOSIS — K219 Gastro-esophageal reflux disease without esophagitis: Secondary | ICD-10-CM | POA: Diagnosis not present

## 2017-07-10 DIAGNOSIS — M797 Fibromyalgia: Secondary | ICD-10-CM | POA: Diagnosis not present

## 2017-07-10 DIAGNOSIS — Z6825 Body mass index (BMI) 25.0-25.9, adult: Secondary | ICD-10-CM | POA: Diagnosis not present

## 2017-07-10 DIAGNOSIS — M3130 Wegener's granulomatosis without renal involvement: Secondary | ICD-10-CM | POA: Diagnosis not present

## 2017-07-10 DIAGNOSIS — M5030 Other cervical disc degeneration, unspecified cervical region: Secondary | ICD-10-CM | POA: Diagnosis not present

## 2017-07-20 DIAGNOSIS — M25512 Pain in left shoulder: Secondary | ICD-10-CM | POA: Diagnosis not present

## 2017-07-20 DIAGNOSIS — M792 Neuralgia and neuritis, unspecified: Secondary | ICD-10-CM | POA: Diagnosis not present

## 2017-07-20 DIAGNOSIS — M4722 Other spondylosis with radiculopathy, cervical region: Secondary | ICD-10-CM | POA: Diagnosis not present

## 2017-08-01 DIAGNOSIS — H25013 Cortical age-related cataract, bilateral: Secondary | ICD-10-CM | POA: Diagnosis not present

## 2017-08-01 DIAGNOSIS — H2513 Age-related nuclear cataract, bilateral: Secondary | ICD-10-CM | POA: Diagnosis not present

## 2017-08-02 ENCOUNTER — Encounter (HOSPITAL_COMMUNITY): Payer: Self-pay

## 2017-08-02 ENCOUNTER — Other Ambulatory Visit: Payer: Self-pay

## 2017-08-02 ENCOUNTER — Encounter (HOSPITAL_COMMUNITY)
Admission: RE | Admit: 2017-08-02 | Discharge: 2017-08-02 | Disposition: A | Discharge status: Home / Self Care | Payer: Medicare Other | Source: Ambulatory Visit | Attending: Ophthalmology | Admitting: Ophthalmology

## 2017-08-02 DIAGNOSIS — Z01812 Encounter for preprocedural laboratory examination: Secondary | ICD-10-CM | POA: Diagnosis not present

## 2017-08-02 DIAGNOSIS — Z01810 Encounter for preprocedural cardiovascular examination: Secondary | ICD-10-CM | POA: Diagnosis not present

## 2017-08-02 DIAGNOSIS — H269 Unspecified cataract: Secondary | ICD-10-CM | POA: Insufficient documentation

## 2017-08-02 HISTORY — DX: Gastro-esophageal reflux disease without esophagitis: K21.9

## 2017-08-02 HISTORY — DX: Headache: R51

## 2017-08-02 HISTORY — DX: Headache, unspecified: R51.9

## 2017-08-02 HISTORY — DX: Hypothyroidism, unspecified: E03.9

## 2017-08-02 LAB — BASIC METABOLIC PANEL
ANION GAP: 8 (ref 5–15)
BUN: 18 mg/dL (ref 6–20)
CALCIUM: 9.1 mg/dL (ref 8.9–10.3)
CHLORIDE: 106 mmol/L (ref 101–111)
CO2: 25 mmol/L (ref 22–32)
CREATININE: 1.09 mg/dL — AB (ref 0.44–1.00)
GFR calc Af Amer: 60 mL/min — ABNORMAL LOW (ref >60)
GFR, EST NON AFRICAN AMERICAN: 52 mL/min — AB (ref >60)
GLUCOSE: 113 mg/dL — AB (ref 65–99)
POTASSIUM: 4 mmol/L (ref 3.5–5.1)
Sodium: 139 mmol/L (ref 135–145)

## 2017-08-02 LAB — CBC
HCT: 41.8 % (ref 36.0–46.0)
HEMOGLOBIN: 13.8 g/dL (ref 12.0–15.0)
MCH: 30.5 pg (ref 26.0–34.0)
MCHC: 33 g/dL (ref 30.0–36.0)
MCV: 92.3 fL (ref 78.0–100.0)
PLATELETS: 266 10*3/uL (ref 150–400)
RBC: 4.53 MIL/uL (ref 3.87–5.11)
RDW: 13.3 % (ref 11.5–15.5)
WBC: 9.2 10*3/uL (ref 4.0–10.5)

## 2017-08-02 NOTE — Patient Instructions (Signed)
Your procedure is scheduled on:   08/08/2017              Report to [facility] at 7:30   AM.  Call this number if you have problems the morning of surgery: [phone]   Remember:   Do not eat or drink :After Midnight.    Take these medicines the morning of surgery with A SIP OF WATER:         Levothyroxine, Claritin, Aciphex and Topamax   Do not wear jewelry, make-up or nail polish.  Do not wear lotions, powders, or perfumes. You may wear deodorant.  Do not bring valuables to the hospital.  Contacts, dentures or bridgework may not be worn into surgery.  Patients discharged the day of surgery will not be allowed to drive home.  Name and phone number of your driver:    @[email or web address] @ Cataract Surgery  A cataract is a clouding of the lens of the eye. When a lens becomes cloudy, vision is reduced based on the degree and nature of the clouding. Surgery may be needed to improve vision. Surgery removes the cloudy lens and usually replaces it with a substitute lens (intraocular lens, IOL). LET YOUR EYE DOCTOR KNOW ABOUT:  Allergies to food or medicine.   Medicines taken including herbs, eyedrops, over-the-counter medicines, and creams.   Use of steroids (by mouth or creams).   Previous problems with anesthetics or numbing medicine.   History of bleeding problems or blood clots.   Previous surgery.   Other health problems, including diabetes and kidney problems.   Possibility of pregnancy, if this applies.  RISKS AND COMPLICATIONS  Infection.   Inflammation of the eyeball (endophthalmitis) that can spread to both eyes (sympathetic ophthalmia).   Poor wound healing.   If an IOL is inserted, it can later fall out of proper position. This is very uncommon.   Clouding of the part of your eye that holds an IOL in place. This is called an "after-cataract." These are uncommon, but easily treated.  BEFORE THE PROCEDURE  Do not eat or drink anything except small amounts of  water for 8 to 12 before your surgery, or as directed by your caregiver.   Unless you are told otherwise, continue any eyedrops you have been prescribed.   Talk to your primary caregiver about all other medicines that you take (both prescription and non-prescription). In some cases, you may need to stop or change medicines near the time of your surgery. This is most important if you are taking blood-thinning medicine.Do not stop medicines unless you are told to do so.   Arrange for someone to drive you to and from the procedure.   Do not put contact lenses in either eye on the day of your surgery.  PROCEDURE There is more than one method for safely removing a cataract. Your doctor can explain the differences and help determine which is best for you. Phacoemulsification surgery is the most common form of cataract surgery.  An injection is given behind the eye or eyedrops are given to make this a painless procedure.   A small cut (incision) is made on the edge of the clear, dome-shaped surface that covers the front of the eye (cornea).   A tiny probe is painlessly inserted into the eye. This device gives off ultrasound waves that soften and break up the cloudy center of the lens. This makes it easier for the cloudy lens to be removed by suction.   An IOL  may be implanted.   The normal lens of the eye is covered by a clear capsule. Part of that capsule is intentionally left in the eye to support the IOL.   Your surgeon may or may not use stitches to close the incision.  There are other forms of cataract surgery that require a larger incision and stiches to close the eye. This approach is taken in cases where the doctor feels that the cataract cannot be easily removed using phacoemulsification. AFTER THE PROCEDURE  When an IOL is implanted, it does not need care. It becomes a permanent part of your eye and cannot be seen or felt.   Your doctor will schedule follow-up exams to check on your  progress.   Review your other medicines with your doctor to see which can be resumed after surgery.   Use eyedrops or take medicine as prescribed by your doctor.  Document Released: 10/20/2011 Document Reviewed: 10/17/2011 [facility] Patient Information 2012 [place].  .Cataract Surgery Care After Refer to this sheet in the next few weeks. These instructions provide you with information on caring for yourself after your procedure. Your caregiver may also give you more specific instructions. Your treatment has been planned according to current medical practices, but problems sometimes occur. Call your caregiver if you have any problems or questions after your procedure.  HOME CARE INSTRUCTIONS   Avoid strenuous activities as directed by your caregiver.   Ask your caregiver when you can resume driving.   Use eyedrops or other medicines to help healing and control pressure inside your eye as directed by your caregiver.   Only take over-the-counter or prescription medicines for pain, discomfort, or fever as directed by your caregiver.   Do not to touch or rub your eyes.   You may be instructed to use a protective shield during the first few days and nights after surgery. If not, wear sunglasses to protect your eyes. This is to protect the eye from pressure or from being accidentally bumped.   Keep the area around your eye clean and dry. Avoid swimming or allowing water to hit you directly in the face while showering. Keep soap and shampoo out of your eyes.   Do not bend or lift heavy objects. Bending increases pressure in the eye. You can walk, climb stairs, and do light household chores.   Do not put a contact lens into the eye that had surgery until your caregiver says it is okay to do so.   Ask your doctor when you can return to work. This will depend on the kind of work that you do. If you work in a dusty environment, you may be advised to wear protective eyewear for a period of  time.   Ask your caregiver when it will be safe to engage in sexual activity.   Continue with your regular eye exams as directed by your caregiver.  What to expect:  It is normal to feel itching and mild discomfort for a few days after cataract surgery. Some fluid discharge is also common, and your eye may be sensitive to light and touch.   After 1 to 2 days, even moderate discomfort should disappear. In most cases, healing will take about 6 weeks.   If you received an intraocular lens (IOL), you may notice that colors are very bright or have a blue tinge. Also, if you have been in bright sunlight, everything may appear reddish for a few hours. If you see these color tinges, it  is because your lens is clear and no longer cloudy. Within a few months after receiving an IOL, these extra colors should go away. When you have healed, you will probably need new glasses.  SEEK MEDICAL CARE IF:   You have increased bruising around your eye.   You have discomfort not helped by medicine.  SEEK IMMEDIATE MEDICAL CARE IF:   You have a fever.   You have a worsening or sudden vision loss.   You have redness, swelling, or increasing pain in the eye.   You have a thick discharge from the eye that had surgery.  MAKE SURE YOU:  Understand these instructions.   Will watch your condition.   Will get help right away if you are not doing well or get worse.  Document Released: 05/20/2005 Document Revised: 10/20/2011 Document Reviewed: 06/24/2011 [facility] Patient Information 2012 [place].    Monitored Anesthesia Care  Monitored anesthesia care is an anesthesia service for a medical procedure. Anesthesia is the loss of the ability to feel pain. It is produced by medications called anesthetics. It may affect a small area of your body (local anesthesia), a large area of your body (regional anesthesia), or your entire body (general anesthesia). The need for monitored anesthesia care depends your  procedure, your condition, and the potential need for regional or general anesthesia. It is often provided during procedures where:   General anesthesia may be needed if there are complications. This is because you need special care when you are under general anesthesia.   You will be under local or regional anesthesia. This is so that you are able to have higher levels of anesthesia if needed.   You will receive calming medications (sedatives). This is especially the case if sedatives are given to put you in a semi-conscious state of relaxation (deep sedation). This is because the amount of sedative needed to produce this state can be hard to predict. Too much of a sedative can produce general anesthesia. Monitored anesthesia care is performed by one or more caregivers who have special training in all types of anesthesia. You will need to meet with these caregivers before your procedure. During this meeting, they will ask you about your medical history. They will also give you instructions to follow. (For example, you will need to stop eating and drinking before your procedure. You may also need to stop or change medications you are taking.) During your procedure, your caregivers will stay with you. They will:   Watch your condition. This includes watching you blood pressure, breathing, and level of pain.   Diagnose and treat problems that occur.   Give medications if they are needed. These may include calming medications (sedatives) and anesthetics.   Make sure you are comfortable.  Having monitored anesthesia care does not necessarily mean that you will be under anesthesia. It does mean that your caregivers will be able to manage anesthesia if you need it or if it occurs. It also means that you will be able to have a different type of anesthesia than you are having if you need it. When your procedure is complete, your caregivers will continue to watch your condition. They will make sure any  medications wear off before you are allowed to go home.  Document Released: 07/27/2005 Document Revised: 02/25/2013 Document Reviewed: 12/12/2012 [facility] Patient Information 2014 [place], [place].

## 2017-08-04 DIAGNOSIS — M5412 Radiculopathy, cervical region: Secondary | ICD-10-CM | POA: Diagnosis not present

## 2017-08-07 MED ORDER — KETOROLAC TROMETHAMINE 0.5 % OP SOLN
OPHTHALMIC | Status: AC
  Filled 2017-08-07: qty 5

## 2017-08-07 MED ORDER — PHENYLEPHRINE HCL 2.5 % OP SOLN
OPHTHALMIC | Status: AC
  Filled 2017-08-07: qty 15

## 2017-08-07 MED ORDER — CYCLOPENTOLATE-PHENYLEPHRINE 0.2-1 % OP SOLN
OPHTHALMIC | Status: AC
  Filled 2017-08-07: qty 2

## 2017-08-07 MED ORDER — TETRACAINE HCL 0.5 % OP SOLN
OPHTHALMIC | Status: AC
  Filled 2017-08-07: qty 4

## 2017-08-08 ENCOUNTER — Ambulatory Visit (HOSPITAL_COMMUNITY)
Admission: RE | Admit: 2017-08-08 | Discharge: 2017-08-08 | Disposition: A | Discharge status: Home / Self Care | Payer: Medicare Other | Source: Ambulatory Visit | Attending: Ophthalmology | Admitting: Ophthalmology

## 2017-08-08 ENCOUNTER — Encounter (HOSPITAL_COMMUNITY): Payer: Self-pay

## 2017-08-08 ENCOUNTER — Ambulatory Visit (HOSPITAL_COMMUNITY): Payer: Medicare Other | Admitting: Anesthesiology

## 2017-08-08 ENCOUNTER — Encounter (HOSPITAL_COMMUNITY)
Admission: RE | Disposition: A | Discharge status: Home / Self Care | Payer: Self-pay | Source: Ambulatory Visit | Attending: Ophthalmology

## 2017-08-08 DIAGNOSIS — H25011 Cortical age-related cataract, right eye: Secondary | ICD-10-CM | POA: Diagnosis not present

## 2017-08-08 DIAGNOSIS — Z7982 Long term (current) use of aspirin: Secondary | ICD-10-CM | POA: Insufficient documentation

## 2017-08-08 DIAGNOSIS — K219 Gastro-esophageal reflux disease without esophagitis: Secondary | ICD-10-CM | POA: Insufficient documentation

## 2017-08-08 DIAGNOSIS — H2511 Age-related nuclear cataract, right eye: Secondary | ICD-10-CM | POA: Insufficient documentation

## 2017-08-08 DIAGNOSIS — Z79899 Other long term (current) drug therapy: Secondary | ICD-10-CM | POA: Insufficient documentation

## 2017-08-08 DIAGNOSIS — E039 Hypothyroidism, unspecified: Secondary | ICD-10-CM | POA: Insufficient documentation

## 2017-08-08 HISTORY — PX: CATARACT EXTRACTION W/PHACO: SHX586

## 2017-08-08 SURGERY — PHACOEMULSIFICATION, CATARACT, WITH IOL INSERTION
Anesthesia: Monitor Anesthesia Care | Site: Eye | Laterality: Right

## 2017-08-08 MED ORDER — FENTANYL CITRATE (PF) 100 MCG/2ML IJ SOLN
INTRAMUSCULAR | Status: AC
  Filled 2017-08-08: qty 2

## 2017-08-08 MED ORDER — MIDAZOLAM HCL 2 MG/2ML IJ SOLN
INTRAMUSCULAR | Status: AC
  Filled 2017-08-08: qty 2

## 2017-08-08 MED ORDER — TETRACAINE 0.5 % OP SOLN OPTIME - NO CHARGE
OPHTHALMIC | Status: DC | PRN
  Administered 2017-08-08: 2 [drp] via OPHTHALMIC

## 2017-08-08 MED ORDER — PROVISC 10 MG/ML IO SOLN
INTRAOCULAR | Status: DC | PRN
  Administered 2017-08-08: 0.85 mL via INTRAOCULAR

## 2017-08-08 MED ORDER — PHENYLEPHRINE HCL 2.5 % OP SOLN
1.0000 [drp] | OPHTHALMIC | Status: AC
  Administered 2017-08-08 (×3): 1 [drp] via OPHTHALMIC

## 2017-08-08 MED ORDER — KETOROLAC TROMETHAMINE 0.5 % OP SOLN
1.0000 [drp] | OPHTHALMIC | Status: AC
  Administered 2017-08-08 (×3): 1 [drp] via OPHTHALMIC

## 2017-08-08 MED ORDER — EPINEPHRINE PF 1 MG/ML IJ SOLN
INTRAOCULAR | Status: DC | PRN
  Administered 2017-08-08: 500 mL

## 2017-08-08 MED ORDER — MIDAZOLAM HCL 2 MG/2ML IJ SOLN
1.0000 mg | INTRAMUSCULAR | Status: AC
  Administered 2017-08-08: 2 mg via INTRAVENOUS

## 2017-08-08 MED ORDER — BSS IO SOLN
INTRAOCULAR | Status: DC | PRN
  Administered 2017-08-08: 15 mL

## 2017-08-08 MED ORDER — LACTATED RINGERS IV SOLN
INTRAVENOUS | Status: DC
  Administered 2017-08-08: 08:00:00 via INTRAVENOUS

## 2017-08-08 MED ORDER — TETRACAINE HCL 0.5 % OP SOLN
1.0000 [drp] | OPHTHALMIC | Status: AC
  Administered 2017-08-08 (×3): 1 [drp] via OPHTHALMIC

## 2017-08-08 MED ORDER — CYCLOPENTOLATE-PHENYLEPHRINE 0.2-1 % OP SOLN
1.0000 [drp] | OPHTHALMIC | Status: AC
  Administered 2017-08-08 (×3): 1 [drp] via OPHTHALMIC

## 2017-08-08 MED ORDER — FENTANYL CITRATE (PF) 100 MCG/2ML IJ SOLN
25.0000 ug | Freq: Once | INTRAMUSCULAR | Status: AC
  Administered 2017-08-08: 25 ug via INTRAVENOUS

## 2017-08-08 SURGICAL SUPPLY — 10 items
CLOTH BEACON ORANGE TIMEOUT ST (SAFETY) ×2 IMPLANT
EYE SHIELD UNIVERSAL CLEAR (GAUZE/BANDAGES/DRESSINGS) ×2 IMPLANT
GLOVE BIO SURGEON STRL SZ 6.5 (GLOVE) ×2 IMPLANT
GLOVE BIOGEL PI IND STRL 7.0 (GLOVE) ×1 IMPLANT
GLOVE BIOGEL PI INDICATOR 7.0 (GLOVE) ×1
LENS ALC ACRYL/TECN (Ophthalmic Related) ×2 IMPLANT
PAD ARMBOARD 7.5X6 YLW CONV (MISCELLANEOUS) ×2 IMPLANT
TAPE SURG TRANSPORE 1 IN (GAUZE/BANDAGES/DRESSINGS) ×1 IMPLANT
TAPE SURGICAL TRANSPORE 1 IN (GAUZE/BANDAGES/DRESSINGS) ×1
WATER STERILE IRR 250ML POUR (IV SOLUTION) ×2 IMPLANT

## 2017-08-08 NOTE — Anesthesia Procedure Notes (Signed)
Procedure Name: MAC Date/Time: 08/08/2017 9:15 AM Performed by: Andree Elk, Kadra Kohan A Pre-anesthesia Checklist: Patient identified, Timeout performed, Emergency Drugs available, Suction available and Patient being monitored Oxygen Delivery Method: Nasal cannula

## 2017-08-08 NOTE — Anesthesia Preprocedure Evaluation (Signed)
Anesthesia Evaluation  Patient identified by MRN, date of birth, ID band Patient awake    Reviewed: Allergy & Precautions, NPO status , Patient's Chart, lab work & pertinent test results  Airway Mallampati: I  TM Distance: >3 FB Neck ROM: Full    Dental  (+) Teeth Intact, Caps,    Pulmonary neg pulmonary ROS,    breath sounds clear to auscultation       Cardiovascular negative cardio ROS   Rhythm:Regular Rate:Normal     Neuro/Psych  Headaches,    GI/Hepatic GERD  ,  Endo/Other  Hypothyroidism   Renal/GU      Musculoskeletal   Abdominal   Peds  Hematology   Anesthesia Other Findings   Reproductive/Obstetrics                             Anesthesia Physical Anesthesia Plan  ASA: II  Anesthesia Plan: MAC   Post-op Pain Management:    Induction: Intravenous  PONV Risk Score and Plan:   Airway Management Planned: Nasal Cannula  Additional Equipment:   Intra-op Plan:   Post-operative Plan:   Informed Consent: I have reviewed the patients History and Physical, chart, labs and discussed the procedure including the risks, benefits and alternatives for the proposed anesthesia with the patient or authorized representative who has indicated his/her understanding and acceptance.     Plan Discussed with:   Anesthesia Plan Comments:         Anesthesia Quick Evaluation

## 2017-08-08 NOTE — Transfer of Care (Signed)
Immediate Anesthesia Transfer of Care Note  Patient: Brittany Shields  Procedure(s) Performed: Procedure(s) with comments: CATARACT EXTRACTION PHACO AND INTRAOCULAR LENS PLACEMENT (IOC) (Right) - CDE: 4.18  Patient Location: Short Stay  Anesthesia Type:MAC  Level of Consciousness: awake, alert , oriented and patient cooperative  Airway & Oxygen Therapy: Patient Spontanous Breathing  Post-op Assessment: Report given to RN and Post -op Vital signs reviewed and stable  Post vital signs: Reviewed and stable  Last Vitals:  Vitals:   08/08/17 0809  BP: (!) 164/88  Pulse: 79  Temp: 36.7 C  SpO2: 96%    Last Pain:  Vitals:   08/08/17 0809  TempSrc: Oral  PainSc: 4          Complications: No apparent anesthesia complications

## 2017-08-08 NOTE — Discharge Instructions (Signed)
°  °          [facility] Instructions °[address]- [place] [address] °    ° °1. Avoid closing eyes tightly. One often closes the eye tightly when laughing, talking, sneezing, coughing or if they feel irritated. At these times, you should be careful not to close your eyes tightly. ° °2. Instill eye drops as instructed. To instill drops in your eye, open it, look up and have someone gently pull the lower lid down and instill a couple of drops inside the lower lid. ° °3. Do not touch upper lid. ° °4. Take Advil or Tylenol for pain. ° °5. You may use either eye for near work, such as reading or sewing and you may watch television. ° °6. You may have your hair done at the beauty parlor at any time. ° °7. Wear dark glasses with or without your own glasses if you are in bright light. ° °8. Call our office at [phone] or [phone] if you have sharp pain in your eye or unusual symptoms. ° °9.  FOLLOW UP WITH DR. SHAPIRO TODAY IN HIS  OFFICE AT 2:45pm. ° °  °I have received a copy of the above instructions and will follow them.  ° ° ° °IF YOU ARE IN IMMEDIATE DANGER CALL 911! ° °It is important for you to keep your follow-up appointment with your physician after discharge, OR, for you /your caregiver to make a follow-up appointment with your physician / medical provider after discharge. ° °Show these instructions to the next healthcare provider you see. ° °

## 2017-08-08 NOTE — Op Note (Signed)
Patient brought to the operating room and prepped and draped in the usual manner.  Lid speculum inserted in right eye.  Stab incision made at the twelve o'clock position.  Provisc instilled in the anterior chamber.   A 2.4 mm. Stab incision was made temporally.  An anterior capsulotomy was done with a bent 25 gauge needle.  The nucleus was hydrodissected.  The Phaco tip was inserted in the anterior chamber and the nucleus was emulsified.  CDE was 4.18.  The cortical material was then removed with the I and A tip.  Posterior capsule was the polished.  The anterior chamber was deepened with Provisc.  A 20.5 Diopter Alcon [id number] IOL was then inserted in the capsular bag.  Provisc was then removed with the I and A tip.  The wound was then hydrated.  Patient sent to the Recovery Room in good condition with follow up in my office.  Preoperative Diagnosis: Cortical and Nuclear Cataract OD Postoperative Diagnosis:  Same Procedure name: [facility] Phacoemulsification OD with IOL

## 2017-08-08 NOTE — Anesthesia Postprocedure Evaluation (Signed)
Anesthesia Post Note  Patient: Brittany Shields  Procedure(s) Performed: Procedure(s) (LRB): CATARACT EXTRACTION PHACO AND INTRAOCULAR LENS PLACEMENT (IOC) (Right)  Patient location during evaluation: Short Stay Anesthesia Type: MAC Level of consciousness: awake and alert, oriented and patient cooperative Pain management: pain level controlled Vital Signs Assessment: post-procedure vital signs reviewed and stable Respiratory status: spontaneous breathing Cardiovascular status: stable Postop Assessment: no apparent nausea or vomiting Anesthetic complications: no     Last Vitals:  Vitals:   08/08/17 0809  BP: (!) 164/88  Pulse: 79  Temp: 36.7 C  SpO2: 96%    Last Pain:  Vitals:   08/08/17 0809  TempSrc: Oral  PainSc: 4                  Laray Corbit A

## 2017-08-08 NOTE — H&P (Signed)
The patient was re examined and there is no change in the patients condition since the original H and P. 

## 2017-08-09 ENCOUNTER — Encounter (HOSPITAL_COMMUNITY): Payer: Self-pay | Admitting: Ophthalmology

## 2017-08-09 DIAGNOSIS — Z23 Encounter for immunization: Secondary | ICD-10-CM | POA: Diagnosis not present

## 2017-08-10 ENCOUNTER — Other Ambulatory Visit: Payer: Self-pay | Admitting: Neurosurgery

## 2017-08-10 DIAGNOSIS — Z6827 Body mass index (BMI) 27.0-27.9, adult: Secondary | ICD-10-CM | POA: Diagnosis not present

## 2017-08-10 DIAGNOSIS — I10 Essential (primary) hypertension: Secondary | ICD-10-CM | POA: Diagnosis not present

## 2017-08-10 DIAGNOSIS — M5412 Radiculopathy, cervical region: Secondary | ICD-10-CM | POA: Diagnosis not present

## 2017-08-10 DIAGNOSIS — M542 Cervicalgia: Secondary | ICD-10-CM | POA: Diagnosis not present

## 2017-08-22 NOTE — Pre-Procedure Instructions (Signed)
Dominigue Gellner  08/22/2017      [facility] [address] [place] [address] Phone: [phone] Fax: [phone]  [facility] [place], [place] [address] [place] [address] Phone: [phone] Fax: [phone]    Your procedure is scheduled on Monday October 15.  Report to [facility] Admitting at 8:15 A.M.  Call this number if you have problems the morning of surgery:  [phone]   Remember:  Do not eat food or drink liquids after midnight.  Take these medicines the morning of surgery with A SIP OF WATER: levothyroxine (synthroid), loratadine (claritin), rabeprazole (Aciphex), topiramate (topamax), prednisone (deltasone), DUAVEE  7 days prior to surgery STOP taking any Aspirin (unless otherwise instructed by your surgeon), Excedrin Migraine, Aleve, Naproxen, Ibuprofen, Motrin, Advil, Goody's, BC's, all herbal medications, fish oil, and all vitamins    Do not wear jewelry, make-up or nail polish.  Do not wear lotions, powders, or perfumes, or deoderant.  Do not shave 48 hours prior to surgery.  Men may shave face and neck.  Do not bring valuables to the hospital.  [facility] is not responsible for any belongings or valuables.  Contacts, dentures or bridgework may not be worn into surgery.  Leave your suitcase in the car.  After surgery it may be brought to your room.  For patients admitted to the hospital, discharge time will be determined by your treatment team.  Patients discharged the day of surgery will not be allowed to drive home.   Special instructions:    [place]- Preparing For Surgery  Before surgery, you can play an important role. Because skin is not sterile, your skin needs to be as free of germs as possible. You can reduce the number of germs on your skin by washing with CHG (chlorahexidine gluconate) Soap before surgery.  CHG is an antiseptic  cleaner which kills germs and bonds with the skin to continue killing germs even after washing.  Please do not use if you have an allergy to CHG or antibacterial soaps. If your skin becomes reddened/irritated stop using the CHG.  Do not shave (including legs and underarms) for at least 48 hours prior to first CHG shower. It is OK to shave your face.  Please follow these instructions carefully.   1. Shower the NIGHT BEFORE SURGERY and the MORNING OF SURGERY with CHG.   2. If you chose to wash your hair, wash your hair first as usual with your normal shampoo.  3. After you shampoo, rinse your hair and body thoroughly to remove the shampoo.  4. Use CHG as you would any other liquid soap. You can apply CHG directly to the skin and wash gently with a scrungie or a clean washcloth.   5. Apply the CHG Soap to your body ONLY FROM THE NECK DOWN.  Do not use on open wounds or open sores. Avoid contact with your eyes, ears, mouth and genitals (private parts). Wash genitals (private parts) with your normal soap.  USE REGULAR SHAMPOO AND CONDITIONER FOR HAIR USE REGULAR SOAP FOR FACE AND PRIVATE AREA  6. Wash thoroughly, paying special attention to the area where your surgery will be performed.  7. Thoroughly rinse your body with warm water from the neck down.  8. DO NOT shower/wash with your normal soap after using and rinsing off the CHG Soap.  9. [place]  yourself dry with a CLEAN TOWEL and [place] CLOTH  10. Wear CLEAN PAJAMAS to bed the night before surgery, wear comfortable clothes the morning of surgery  11. Place CLEAN SHEETS on your bed the night of your first shower and DO NOT SLEEP WITH PETS.    Day of Surgery: Do not apply any deodorants/lotions. Please wear clean clothes to the hospital/surgery center.      Please read over the following fact sheets that you were given. Coughing and Deep Breathing and MRSA Information

## 2017-08-23 ENCOUNTER — Encounter (HOSPITAL_COMMUNITY): Payer: Self-pay

## 2017-08-23 ENCOUNTER — Encounter (HOSPITAL_COMMUNITY)
Admission: RE | Admit: 2017-08-23 | Discharge: 2017-08-23 | Disposition: A | Discharge status: Home / Self Care | Payer: Medicare Other | Source: Ambulatory Visit | Attending: Neurosurgery | Admitting: Neurosurgery

## 2017-08-23 DIAGNOSIS — Z01812 Encounter for preprocedural laboratory examination: Secondary | ICD-10-CM | POA: Insufficient documentation

## 2017-08-23 HISTORY — DX: Chronic sinusitis, unspecified: J32.9

## 2017-08-23 HISTORY — DX: Unspecified osteoarthritis, unspecified site: M19.90

## 2017-08-23 LAB — BASIC METABOLIC PANEL
ANION GAP: 9 (ref 5–15)
BUN: 13 mg/dL (ref 6–20)
CHLORIDE: 105 mmol/L (ref 101–111)
CO2: 25 mmol/L (ref 22–32)
CREATININE: 1.02 mg/dL — AB (ref 0.44–1.00)
Calcium: 9.4 mg/dL (ref 8.9–10.3)
GFR calc non Af Amer: 56 mL/min — ABNORMAL LOW (ref >60)
Glucose, Bld: 88 mg/dL (ref 65–99)
POTASSIUM: 3.7 mmol/L (ref 3.5–5.1)
SODIUM: 139 mmol/L (ref 135–145)

## 2017-08-23 LAB — CBC WITH DIFFERENTIAL/PLATELET
Basophils Absolute: 0.1 10*3/uL (ref 0.0–0.1)
Basophils Relative: 1 %
EOS ABS: 0.2 10*3/uL (ref 0.0–0.7)
Eosinophils Relative: 2 %
HEMATOCRIT: 43 % (ref 36.0–46.0)
HEMOGLOBIN: 13.9 g/dL (ref 12.0–15.0)
LYMPHS ABS: 1.3 10*3/uL (ref 0.7–4.0)
Lymphocytes Relative: 11 %
MCH: 29.4 pg (ref 26.0–34.0)
MCHC: 32.3 g/dL (ref 30.0–36.0)
MCV: 91.1 fL (ref 78.0–100.0)
Monocytes Absolute: 0.8 10*3/uL (ref 0.1–1.0)
Monocytes Relative: 7 %
NEUTROS ABS: 8.9 10*3/uL — AB (ref 1.7–7.7)
NEUTROS PCT: 79 %
Platelets: 288 10*3/uL (ref 150–400)
RBC: 4.72 MIL/uL (ref 3.87–5.11)
RDW: 13.4 % (ref 11.5–15.5)
WBC: 11.2 10*3/uL — AB (ref 4.0–10.5)

## 2017-08-23 LAB — SURGICAL PCR SCREEN
MRSA, PCR: NEGATIVE
STAPHYLOCOCCUS AUREUS: NEGATIVE

## 2017-08-23 NOTE — Progress Notes (Signed)
PCP: Dr. Gar Ponto in [place] Cardiologist: Denies  EKG: 08/02/17 from recent eye surgery  Pt concerned about continuing eye drops as directed from recent eye surgery.  Pt instructed to bring eye drops from home since hospital pharmacy does not carry her prescription.  Patient denies shortness of breath, fever, cough, and chest pain at PAT appointment.  Patient verbalized understanding of instructions provided today at the PAT appointment.  Patient asked to review instructions at home and day of surgery.

## 2017-08-25 MED ORDER — DEXAMETHASONE SODIUM PHOSPHATE 10 MG/ML IJ SOLN
10.0000 mg | INTRAMUSCULAR | Status: DC
  Filled 2017-08-25: qty 1

## 2017-08-25 MED ORDER — CEFAZOLIN SODIUM-DEXTROSE 2-4 GM/100ML-% IV SOLN
2.0000 g | INTRAVENOUS | Status: AC
  Administered 2017-08-28: 2 g via INTRAVENOUS
  Filled 2017-08-25: qty 100

## 2017-08-28 ENCOUNTER — Encounter (HOSPITAL_COMMUNITY): Payer: Self-pay

## 2017-08-28 ENCOUNTER — Inpatient Hospital Stay (HOSPITAL_COMMUNITY): Payer: Medicare Other

## 2017-08-28 ENCOUNTER — Encounter (HOSPITAL_COMMUNITY)
Admission: RE | Disposition: A | Discharge status: Home / Self Care | Payer: Self-pay | Source: Ambulatory Visit | Attending: Neurosurgery

## 2017-08-28 ENCOUNTER — Inpatient Hospital Stay (HOSPITAL_COMMUNITY): Payer: Medicare Other | Admitting: Anesthesiology

## 2017-08-28 ENCOUNTER — Inpatient Hospital Stay (HOSPITAL_COMMUNITY)
Admission: RE | Admit: 2017-08-28 | Discharge: 2017-08-29 | DRG: 473 | Disposition: A | Discharge status: Home / Self Care | Payer: Medicare Other | Source: Ambulatory Visit | Attending: Neurosurgery | Admitting: Neurosurgery

## 2017-08-28 DIAGNOSIS — K219 Gastro-esophageal reflux disease without esophagitis: Secondary | ICD-10-CM | POA: Diagnosis present

## 2017-08-28 DIAGNOSIS — M1990 Unspecified osteoarthritis, unspecified site: Secondary | ICD-10-CM | POA: Diagnosis present

## 2017-08-28 DIAGNOSIS — M4802 Spinal stenosis, cervical region: Secondary | ICD-10-CM | POA: Diagnosis not present

## 2017-08-28 DIAGNOSIS — M4322 Fusion of spine, cervical region: Secondary | ICD-10-CM | POA: Diagnosis not present

## 2017-08-28 DIAGNOSIS — E039 Hypothyroidism, unspecified: Secondary | ICD-10-CM | POA: Diagnosis not present

## 2017-08-28 DIAGNOSIS — R51 Headache: Secondary | ICD-10-CM | POA: Diagnosis present

## 2017-08-28 DIAGNOSIS — M4722 Other spondylosis with radiculopathy, cervical region: Secondary | ICD-10-CM | POA: Diagnosis not present

## 2017-08-28 DIAGNOSIS — Z419 Encounter for procedure for purposes other than remedying health state, unspecified: Secondary | ICD-10-CM

## 2017-08-28 HISTORY — PX: ANTERIOR CERVICAL DECOMP/DISCECTOMY FUSION: SHX1161

## 2017-08-28 SURGERY — ANTERIOR CERVICAL DECOMPRESSION/DISCECTOMY FUSION 2 LEVELS
Anesthesia: General | Site: Spine Cervical

## 2017-08-28 MED ORDER — TOPIRAMATE 100 MG PO TABS: 100.0000 mg | ORAL_TABLET | ORAL | Status: DC

## 2017-08-28 MED ORDER — PANTOPRAZOLE SODIUM 40 MG PO TBEC: 40.0000 mg | DELAYED_RELEASE_TABLET | Freq: Every day | ORAL | Status: DC

## 2017-08-28 MED ORDER — BACITRACIN 50000 UNITS IM SOLR
INTRAMUSCULAR | Status: DC | PRN
Start: 2017-08-28 — End: 2017-08-28
  Administered 2017-08-28: 500 mL

## 2017-08-28 MED ORDER — CONJ ESTROGENS-BAZEDOXIFENE 0.45-20 MG PO TABS: 1.0000 | ORAL_TABLET | Freq: Every day | ORAL | Status: DC

## 2017-08-28 MED ORDER — OFLOXACIN 0.3 % OP SOLN
1.0000 [drp] | Freq: Two times a day (BID) | OPHTHALMIC | Status: DC
  Administered 2017-08-28: 1 [drp] via OPHTHALMIC

## 2017-08-28 MED ORDER — TOPIRAMATE 100 MG PO TABS
100.0000 mg | ORAL_TABLET | Freq: Every day | ORAL | Status: DC
  Filled 2017-08-28: qty 1

## 2017-08-28 MED ORDER — ONDANSETRON HCL 4 MG/2ML IJ SOLN
INTRAMUSCULAR | Status: DC | PRN
  Administered 2017-08-28: 4 mg via INTRAVENOUS

## 2017-08-28 MED ORDER — SODIUM CHLORIDE-SODIUM BICARB 1.57 G NA PACK: PACK | Freq: Two times a day (BID) | NASAL | Status: DC

## 2017-08-28 MED ORDER — SUGAMMADEX SODIUM 200 MG/2ML IV SOLN
INTRAVENOUS | Status: DC | PRN
  Administered 2017-08-28: 200 mg via INTRAVENOUS

## 2017-08-28 MED ORDER — DIFLUPREDNATE 0.05 % OP EMUL
1.0000 [drp] | Freq: Two times a day (BID) | OPHTHALMIC | Status: DC
  Administered 2017-08-28: 1 [drp] via OPHTHALMIC

## 2017-08-28 MED ORDER — THROMBIN 20000 UNITS EX SOLR
CUTANEOUS | Status: DC | PRN
  Administered 2017-08-28: 20 mL via TOPICAL

## 2017-08-28 MED ORDER — SODIUM CHLORIDE 0.9% FLUSH: 3.0000 mL | Freq: Two times a day (BID) | INTRAVENOUS | Status: DC

## 2017-08-28 MED ORDER — CEFAZOLIN SODIUM-DEXTROSE 1-4 GM/50ML-% IV SOLN
1.0000 g | Freq: Three times a day (TID) | INTRAVENOUS | Status: AC
  Administered 2017-08-28 – 2017-08-29 (×2): 1 g via INTRAVENOUS
  Filled 2017-08-28 (×2): qty 50

## 2017-08-28 MED ORDER — HYDROMORPHONE HCL 1 MG/ML IJ SOLN: 0.2500 mg | INTRAMUSCULAR | Status: DC | PRN

## 2017-08-28 MED ORDER — FENTANYL CITRATE (PF) 100 MCG/2ML IJ SOLN
INTRAMUSCULAR | Status: DC | PRN
Start: 2017-08-28 — End: 2017-08-28
  Administered 2017-08-28 (×3): 50 ug via INTRAVENOUS
  Administered 2017-08-28: 100 ug via INTRAVENOUS

## 2017-08-28 MED ORDER — SODIUM CHLORIDE 0.9% FLUSH: 3.0000 mL | INTRAVENOUS | Status: DC | PRN

## 2017-08-28 MED ORDER — HEMOSTATIC AGENTS (NO CHARGE) OPTIME
TOPICAL | Status: DC | PRN
  Administered 2017-08-28: 1

## 2017-08-28 MED ORDER — THROMBIN 5000 UNITS EX SOLR
CUTANEOUS | Status: DC | PRN
Start: 2017-08-28 — End: 2017-08-28
  Administered 2017-08-28: 5 mL via TOPICAL

## 2017-08-28 MED ORDER — HYDROCODONE-ACETAMINOPHEN 5-325 MG PO TABS
1.0000 | ORAL_TABLET | ORAL | Status: DC | PRN
  Administered 2017-08-28 – 2017-08-29 (×4): 2 via ORAL
  Filled 2017-08-28 (×4): qty 2

## 2017-08-28 MED ORDER — TOPIRAMATE 100 MG PO TABS
200.0000 mg | ORAL_TABLET | Freq: Every day | ORAL | Status: DC
  Administered 2017-08-28: 200 mg via ORAL
  Filled 2017-08-28: qty 2

## 2017-08-28 MED ORDER — CHLORHEXIDINE GLUCONATE CLOTH 2 % EX PADS: 6.0000 | MEDICATED_PAD | Freq: Once | CUTANEOUS | Status: DC

## 2017-08-28 MED ORDER — MIDAZOLAM HCL 2 MG/2ML IJ SOLN
INTRAMUSCULAR | Status: AC
  Filled 2017-08-28: qty 2

## 2017-08-28 MED ORDER — ARTIFICIAL TEARS OPHTHALMIC OINT
TOPICAL_OINTMENT | Freq: Every day | OPHTHALMIC | Status: DC
  Administered 2017-08-28: 21:00:00 via OPHTHALMIC
  Filled 2017-08-28: qty 3.5

## 2017-08-28 MED ORDER — FENTANYL CITRATE (PF) 250 MCG/5ML IJ SOLN
INTRAMUSCULAR | Status: AC
  Filled 2017-08-28: qty 5

## 2017-08-28 MED ORDER — SODIUM CHLORIDE 0.9 % IV SOLN: 250.0000 mL | INTRAVENOUS | Status: DC

## 2017-08-28 MED ORDER — PROPOFOL 10 MG/ML IV BOLUS
INTRAVENOUS | Status: AC
  Filled 2017-08-28: qty 20

## 2017-08-28 MED ORDER — HYDROMORPHONE HCL 1 MG/ML IJ SOLN
0.5000 mg | INTRAMUSCULAR | Status: DC | PRN
  Administered 2017-08-28: 0.5 mg via INTRAVENOUS
  Filled 2017-08-28: qty 0.5

## 2017-08-28 MED ORDER — ONDANSETRON HCL 4 MG PO TABS: 4.0000 mg | ORAL_TABLET | Freq: Four times a day (QID) | ORAL | Status: DC | PRN

## 2017-08-28 MED ORDER — LACTATED RINGERS IV SOLN
INTRAVENOUS | Status: DC
  Administered 2017-08-28 (×2): via INTRAVENOUS

## 2017-08-28 MED ORDER — MIDAZOLAM HCL 5 MG/5ML IJ SOLN
INTRAMUSCULAR | Status: DC | PRN
  Administered 2017-08-28: 2 mg via INTRAVENOUS

## 2017-08-28 MED ORDER — SALINE SPRAY 0.65 % NA SOLN
1.0000 | Freq: Two times a day (BID) | NASAL | Status: DC
  Filled 2017-08-28: qty 44

## 2017-08-28 MED ORDER — 0.9 % SODIUM CHLORIDE (POUR BTL) OPTIME
TOPICAL | Status: DC | PRN
  Administered 2017-08-28: 1000 mL

## 2017-08-28 MED ORDER — ROCURONIUM BROMIDE 100 MG/10ML IV SOLN
INTRAVENOUS | Status: DC | PRN
  Administered 2017-08-28: 50 mg via INTRAVENOUS
  Administered 2017-08-28: 20 mg via INTRAVENOUS

## 2017-08-28 MED ORDER — METOCLOPRAMIDE HCL 5 MG/ML IJ SOLN: 10.0000 mg | Freq: Once | INTRAMUSCULAR | Status: DC | PRN

## 2017-08-28 MED ORDER — PREDNISONE 5 MG PO TABS
5.0000 mg | ORAL_TABLET | Freq: Every day | ORAL | Status: DC
  Filled 2017-08-28: qty 1

## 2017-08-28 MED ORDER — MENTHOL 3 MG MT LOZG: 1.0000 | LOZENGE | OROMUCOSAL | Status: DC | PRN

## 2017-08-28 MED ORDER — ONDANSETRON HCL 4 MG/2ML IJ SOLN
4.0000 mg | Freq: Four times a day (QID) | INTRAMUSCULAR | Status: DC | PRN
  Administered 2017-08-28: 4 mg via INTRAVENOUS
  Filled 2017-08-28: qty 2

## 2017-08-28 MED ORDER — LEVOTHYROXINE SODIUM 100 MCG PO TABS
50.0000 ug | ORAL_TABLET | Freq: Every day | ORAL | Status: DC
Start: 2017-08-29 — End: 2017-08-29
  Administered 2017-08-29: 50 ug via ORAL
  Filled 2017-08-28: qty 1

## 2017-08-28 MED ORDER — MEPERIDINE HCL 25 MG/ML IJ SOLN: 6.2500 mg | INTRAMUSCULAR | Status: DC | PRN

## 2017-08-28 MED ORDER — CYCLOBENZAPRINE HCL 10 MG PO TABS
10.0000 mg | ORAL_TABLET | Freq: Three times a day (TID) | ORAL | Status: DC | PRN
  Administered 2017-08-28 – 2017-08-29 (×2): 10 mg via ORAL
  Filled 2017-08-28 (×2): qty 1

## 2017-08-28 MED ORDER — THROMBIN 5000 UNITS EX SOLR
CUTANEOUS | Status: DC | PRN
Start: 2017-08-28 — End: 2017-08-28
  Administered 2017-08-28 (×2): 5000 [IU] via TOPICAL

## 2017-08-28 MED ORDER — LORATADINE 10 MG PO TABS: 10.0000 mg | ORAL_TABLET | Freq: Every day | ORAL | Status: DC

## 2017-08-28 MED ORDER — DOCUSATE SODIUM 100 MG PO CAPS
200.0000 mg | ORAL_CAPSULE | Freq: Every day | ORAL | Status: DC
  Administered 2017-08-28: 200 mg via ORAL
  Filled 2017-08-28: qty 2

## 2017-08-28 MED ORDER — PHENOL 1.4 % MT LIQD: 1.0000 | OROMUCOSAL | Status: DC | PRN

## 2017-08-28 MED ORDER — THROMBIN 20000 UNITS EX KIT
PACK | CUTANEOUS | Status: AC
  Filled 2017-08-28: qty 1

## 2017-08-28 MED ORDER — EQ RESTORE PM OP OINT: TOPICAL_OINTMENT | Freq: Every day | OPHTHALMIC | Status: DC

## 2017-08-28 MED ORDER — HEMOSTATIC AGENTS (NO CHARGE) OPTIME
TOPICAL | Status: DC | PRN
  Administered 2017-08-28: 1 via TOPICAL

## 2017-08-28 MED ORDER — PROPOFOL 10 MG/ML IV BOLUS
INTRAVENOUS | Status: DC | PRN
  Administered 2017-08-28: 120 mg via INTRAVENOUS

## 2017-08-28 MED ORDER — LIDOCAINE HCL (CARDIAC) 20 MG/ML IV SOLN: INTRAVENOUS | Status: DC | PRN

## 2017-08-28 MED ORDER — LIDOCAINE HCL (CARDIAC) 20 MG/ML IV SOLN
INTRAVENOUS | Status: DC | PRN
  Administered 2017-08-28: 60 mg via INTRAVENOUS

## 2017-08-28 SURGICAL SUPPLY — 69 items
BAG DECANTER FOR FLEXI CONT (MISCELLANEOUS) ×2 IMPLANT
BENZOIN TINCTURE PRP APPL 2/3 (GAUZE/BANDAGES/DRESSINGS) ×2 IMPLANT
BIT DRILL 13 (BIT) ×2 IMPLANT
BUR MATCHSTICK NEURO 3.0 LAGG (BURR) ×2 IMPLANT
CAGE PEEK 6X14X11 (Cage) ×1 IMPLANT
CAGE PEEK 7X14X11 (Cage) ×1 IMPLANT
CANISTER SUCT 3000ML PPV (MISCELLANEOUS) ×2 IMPLANT
CARTRIDGE OIL MAESTRO DRILL (MISCELLANEOUS) ×1 IMPLANT
CLSR STERI-STRIP ANTIMIC 1/2X4 (GAUZE/BANDAGES/DRESSINGS) ×2 IMPLANT
DERMABOND ADVANCED (GAUZE/BANDAGES/DRESSINGS) ×1
DERMABOND ADVANCED .7 DNX12 (GAUZE/BANDAGES/DRESSINGS) ×1 IMPLANT
DIFFUSER DRILL AIR PNEUMATIC (MISCELLANEOUS) ×2 IMPLANT
DRAPE C-ARM 42X72 X-RAY (DRAPES) ×4 IMPLANT
DRAPE LAPAROTOMY 100X72 PEDS (DRAPES) ×2 IMPLANT
DRAPE MICROSCOPE LEICA (MISCELLANEOUS) ×2 IMPLANT
DRAPE POUCH INSTRU U-SHP 10X18 (DRAPES) ×2 IMPLANT
DURAPREP 6ML APPLICATOR 50/CS (WOUND CARE) ×2 IMPLANT
ELECT COATED BLADE 2.86 ST (ELECTRODE) ×2 IMPLANT
ELECT REM PT RETURN 9FT ADLT (ELECTROSURGICAL) ×2
ELECTRODE REM PT RTRN 9FT ADLT (ELECTROSURGICAL) ×1 IMPLANT
GAUZE SPONGE 4X4 12PLY STRL (GAUZE/BANDAGES/DRESSINGS) IMPLANT
GAUZE SPONGE 4X4 12PLY STRL LF (GAUZE/BANDAGES/DRESSINGS) ×2 IMPLANT
GAUZE SPONGE 4X4 16PLY XRAY LF (GAUZE/BANDAGES/DRESSINGS) IMPLANT
GLOVE BIO SURGEON STRL SZ 6.5 (GLOVE) ×4 IMPLANT
GLOVE BIO SURGEON STRL SZ8 (GLOVE) ×2 IMPLANT
GLOVE BIO SURGEON STRL SZ8.5 (GLOVE) ×2 IMPLANT
GLOVE BIOGEL PI IND STRL 6.5 (GLOVE) ×1 IMPLANT
GLOVE BIOGEL PI IND STRL 7.0 (GLOVE) ×1 IMPLANT
GLOVE BIOGEL PI INDICATOR 6.5 (GLOVE) ×1
GLOVE BIOGEL PI INDICATOR 7.0 (GLOVE) ×1
GLOVE ECLIPSE 9.0 STRL (GLOVE) ×4 IMPLANT
GLOVE EXAM NITRILE LRG STRL (GLOVE) IMPLANT
GLOVE EXAM NITRILE XL STR (GLOVE) IMPLANT
GLOVE EXAM NITRILE XS STR PU (GLOVE) IMPLANT
GOWN STRL REUS W/ TWL LRG LVL3 (GOWN DISPOSABLE) ×1 IMPLANT
GOWN STRL REUS W/ TWL XL LVL3 (GOWN DISPOSABLE) ×2 IMPLANT
GOWN STRL REUS W/TWL 2XL LVL3 (GOWN DISPOSABLE) IMPLANT
GOWN STRL REUS W/TWL LRG LVL3 (GOWN DISPOSABLE) ×1
GOWN STRL REUS W/TWL XL LVL3 (GOWN DISPOSABLE) ×2
HALTER HD/CHIN CERV TRACTION D (MISCELLANEOUS) ×2 IMPLANT
HEMOSTAT POWDER SURGIFOAM 1G (HEMOSTASIS) ×2 IMPLANT
HEMOSTAT SURGICEL 2X14 (HEMOSTASIS) ×2 IMPLANT
KIT BASIN OR (CUSTOM PROCEDURE TRAY) ×2 IMPLANT
KIT ROOM TURNOVER OR (KITS) ×2 IMPLANT
NEEDLE SPNL 20GX3.5 QUINCKE YW (NEEDLE) ×2 IMPLANT
NS IRRIG 1000ML POUR BTL (IV SOLUTION) ×2 IMPLANT
OIL CARTRIDGE MAESTRO DRILL (MISCELLANEOUS) ×2
PACK LAMINECTOMY NEURO (CUSTOM PROCEDURE TRAY) ×2 IMPLANT
PAD ARMBOARD 7.5X6 YLW CONV (MISCELLANEOUS) ×6 IMPLANT
PLATE ELITE 42MM (Plate) ×2 IMPLANT
RUBBERBAND STERILE (MISCELLANEOUS) ×4 IMPLANT
SCREW ST 13X4XST VA NS SPNE (Screw) ×6 IMPLANT
SCREW ST VAR 4 ATL (Screw) ×6 IMPLANT
SPACER SPNL 11X14X6XPEEK CVD (Cage) ×1 IMPLANT
SPACER SPNL 11X14X7XPEEK CVD (Cage) ×1 IMPLANT
SPCR SPNL 11X14X6XPEEK CVD (Cage) ×1 IMPLANT
SPCR SPNL 11X14X7XPEEK CVD (Cage) ×1 IMPLANT
SPONGE INTESTINAL PEANUT (DISPOSABLE) ×2 IMPLANT
SPONGE SURGIFOAM ABS GEL 100C (HEMOSTASIS) ×2 IMPLANT
SPONGE SURGIFOAM ABS GEL SZ50 (HEMOSTASIS) ×2 IMPLANT
STRIP CLOSURE SKIN 1/2X4 (GAUZE/BANDAGES/DRESSINGS) ×2 IMPLANT
SUT VIC AB 3-0 SH 8-18 (SUTURE) ×2 IMPLANT
SUT VIC AB 4-0 RB1 18 (SUTURE) ×2 IMPLANT
TAPE CLOTH 4X10 WHT NS (GAUZE/BANDAGES/DRESSINGS) ×2 IMPLANT
TAPE CLOTH SURG 4X10 WHT LF (GAUZE/BANDAGES/DRESSINGS) ×2 IMPLANT
TOWEL GREEN STERILE (TOWEL DISPOSABLE) ×2 IMPLANT
TOWEL GREEN STERILE FF (TOWEL DISPOSABLE) ×2 IMPLANT
TRAP SPECIMEN MUCOUS 40CC (MISCELLANEOUS) ×2 IMPLANT
WATER STERILE IRR 1000ML POUR (IV SOLUTION) ×2 IMPLANT

## 2017-08-28 NOTE — Anesthesia Postprocedure Evaluation (Signed)
Anesthesia Post Note  Patient: Brittany Shields  Procedure(s) Performed: ANTERIOR CERVICAL DECOMPRESSION AND FUSION CERVICAL FIVE-SIX,CERVICAL SIX-SEVEN (N/A Spine Cervical)     Patient location during evaluation: PACU Anesthesia Type: General Level of consciousness: awake and alert Pain management: pain level controlled Vital Signs Assessment: post-procedure vital signs reviewed and stable Respiratory status: spontaneous breathing, nonlabored ventilation and respiratory function stable Cardiovascular status: blood pressure returned to baseline and stable Postop Assessment: no apparent nausea or vomiting Anesthetic complications: no    Last Vitals:  Vitals:   08/28/17 1315 08/28/17 1336  BP:  (!) 152/83  Pulse: 91 79  Resp: 15 16  Temp:  36.7 C  SpO2:  97%    Last Pain:  Vitals:   08/28/17 1245  TempSrc:   PainSc: Asleep                 Girard Koontz A.

## 2017-08-28 NOTE — Transfer of Care (Signed)
Immediate Anesthesia Transfer of Care Note  Patient: Brittany Shields  Procedure(s) Performed: ANTERIOR CERVICAL DECOMPRESSION AND FUSION CERVICAL FIVE-SIX,CERVICAL SIX-SEVEN (N/A Spine Cervical)  Patient Location: PACU  Anesthesia Type:General  Level of Consciousness: awake, alert  and oriented  Airway & Oxygen Therapy: Patient Spontanous Breathing and Patient connected to nasal cannula oxygen  Post-op Assessment: Report given to RN and Post -op Vital signs reviewed and stable  Post vital signs: Reviewed and stable  Last Vitals:  Vitals:   08/28/17 0824  BP: (!) 142/79  Pulse: 81  Resp: 18  Temp: 36.6 C  SpO2: 99%    Last Pain:  Vitals:   08/28/17 0838  TempSrc:   PainSc: 4          Complications: No apparent anesthesia complications

## 2017-08-28 NOTE — H&P (Signed)
Brittany Shields is an 67 y.o. female.   Chief Complaint: neck and left shoulder pain HPI: Brittany Shields is a 66 year old female with progressive neck and left upper extremity pain with associated numbness and tingling. Symptoms have failed conservative management. Workup demonstrates evidence of sigficant spondylosis with stenosis at C5-6 and C6-7. Patient presents now for two-level anterior cervical decompression and fusion in hopes of improving her symptoms.  Past Medical History:  Diagnosis Date  . Arthritis   . Chronic sinusitis   . Generalized headaches   . GERD (gastroesophageal reflux disease)   . Hypothyroidism     Past Surgical History:  Procedure Laterality Date  . CARPAL TUNNEL RELEASE Right 2001  . CATARACT EXTRACTION W/PHACO Right 08/08/2017   Procedure: CATARACT EXTRACTION PHACO AND INTRAOCULAR LENS PLACEMENT ([facility]);  Surgeon: Rutherford Guys, MD;  Location: [facility];  Service: Ophthalmology;  Laterality: Right;  CDE: 4.18  . CHOLECYSTECTOMY    . COLONOSCOPY    . CYST EXCISION     right eye  . EYE SURGERY    . HAMMER TOE SURGERY Bilateral   . NASAL/SINUS ENDOSCOPY    . TUBAL LIGATION    . tubal reversal  1991    History reviewed. No pertinent family history. Social History:  reports that she has never smoked. She has never used smokeless tobacco. She reports that she does not drink alcohol or use drugs.  Allergies:  Allergies  Allergen Reactions  . Zithromax [Azithromycin] Rash    Medications Prior to Admission  Medication Sig Dispense Refill  . Artificial Tear Ointment (EQ RESTORE PM OP) Apply 1 drop to eye at bedtime.    Marland Kitchen aspirin EC 81 MG tablet Take 81 mg by mouth daily.    Marland Kitchen aspirin-acetaminophen-caffeine (EXCEDRIN MIGRAINE) 250-250-65 MG tablet Take 1 tablet by mouth every 6 (six) hours as needed for headache or migraine.    . Calcium Carb-Cholecalciferol (CALCIUM 600+D3 PO) Take 1 tablet by mouth 2 (two) times daily.    Marland Kitchen Cod Liver Oil CAPS Take 1 capsule by  mouth daily.    . Difluprednate (DUREZOL) 0.05 % EMUL Apply 1 drop to eye 2 (two) times daily.    Marland Kitchen docusate sodium (COLACE) 100 MG capsule Take 200 mg by mouth at bedtime.    . DUAVEE 0.45-20 MG TABS Take 1 tablet by mouth daily.    Marland Kitchen levothyroxine (SYNTHROID, LEVOTHROID) 50 MCG tablet Take 50 mcg by mouth daily before breakfast.    . loratadine (CLARITIN) 10 MG tablet Take 10 mg by mouth daily. EQUATE    . ofloxacin (OCUFLOX) 0.3 % ophthalmic solution Place 1 drop into the right eye 2 (two) times daily.    . predniSONE (DELTASONE) 5 MG tablet Take 5 mg by mouth daily with breakfast.    . Probiotic Product (PROBIOTIC DAILY) CAPS Take 1 capsule by mouth daily.    . RABEprazole (ACIPHEX) 20 MG tablet Take 20 mg by mouth daily.    . Sodium Chloride-Sodium Bicarb (AYR SALINE NASAL RINSE NA) Place 1 Package into the nose 2 (two) times daily.    Marland Kitchen topiramate (TOPAMAX) 100 MG tablet Take 100-200 mg by mouth See admin instructions. 1 tab in the morning, 2 tabs in the evening      No results found for this or any previous visit (from the past 48 hour(s)). No results found.  Pertinent items noted in HPI and remainder of comprehensive ROS otherwise negative.  Blood pressure (!) 142/79, pulse 81, temperature 97.8 F (36.6 C), temperature  source Oral, resp. rate 18, weight 69.4 kg (153 lb), SpO2 99 %.  Patient is awake and alert. She is oriented and appropriate. Speech is fluent. Judgment and insight are intact. Cranial nerve function normal bilaterally. Motor and sensory examination of her extremities revealed decreased sensation over light touch in her left C6 dermatome. Patient also with some weakness of her left-sided wrist extensors. Reflexes are normal. Gait is normal. Examination head ears eyes and throat is unremarkable. Chest and abdomen are benign. Extremities are free from injury or deformity. Assessment/Plan C5-6, C6-7 spondylosis with stenosis and radiculopathy. Plan C5-6, C6-7 anterior  cervical discectomy and fusion. Risks and benefits been explained. Patient wishes to proceed.  Eden Toohey A 08/28/2017, 10:21 AM

## 2017-08-28 NOTE — Brief Op Note (Signed)
08/28/2017  12:27 PM  PATIENT:  Brittany Shields  66 y.o. female  PRE-OPERATIVE DIAGNOSIS:  Stenosis  POST-OPERATIVE DIAGNOSIS:  Stenosis  PROCEDURE:  Procedure(s) with comments: ANTERIOR CERVICAL DECOMPRESSION AND FUSION CERVICAL FIVE-SIX,CERVICAL SIX-SEVEN (N/A) - anterior  SURGEON:  Surgeon(s) and Role:    * Earnie Larsson, MD - Primary    * Newman Pies, MD - Assisting  PHYSICIAN ASSISTANT:   ASSISTANTS:    ANESTHESIA:   general  EBL:  Total I/O In: 1000 [I.V.:1000] Out: 50 [Blood:50]  BLOOD ADMINISTERED:none  DRAINS: none   LOCAL MEDICATIONS USED:  MARCAINE     SPECIMEN:  No Specimen  DISPOSITION OF SPECIMEN:  N/A  COUNTS:  YES  TOURNIQUET:  * No tourniquets in log *  DICTATION: .Dragon Dictation  PLAN OF CARE: Admit to inpatient   PATIENT DISPOSITION:  PACU - hemodynamically stable.   Delay start of Pharmacological VTE agent (>24hrs) due to surgical blood loss or risk of bleeding: yes

## 2017-08-28 NOTE — Anesthesia Preprocedure Evaluation (Signed)
Anesthesia Evaluation  Patient identified by MRN, date of birth, ID band Patient awake    Reviewed: Allergy & Precautions, NPO status , Patient's Chart, lab work & pertinent test results  Airway Mallampati: II  TM Distance: >3 FB Neck ROM: Full    Dental no notable dental hx. (+) Teeth Intact, Caps   Pulmonary neg pulmonary ROS,    Pulmonary exam normal breath sounds clear to auscultation       Cardiovascular negative cardio ROS Normal cardiovascular exam Rhythm:Regular Rate:Normal     Neuro/Psych  Headaches, negative psych ROS   GI/Hepatic Neg liver ROS, GERD  Controlled and Medicated,  Endo/Other  Hypothyroidism   Renal/GU negative Renal ROS  negative genitourinary   Musculoskeletal  (+) Arthritis , Osteoarthritis,  Cervical spinal stenosis C5-6, C6-7   Abdominal   Peds  Hematology   Anesthesia Other Findings   Reproductive/Obstetrics                             Anesthesia Physical Anesthesia Plan  ASA: II  Anesthesia Plan: General   Post-op Pain Management:    Induction: Intravenous  PONV Risk Score and Plan: 4 or greater and Ondansetron, Dexamethasone, Midazolam, Propofol infusion and Metaclopromide  Airway Management Planned: Oral ETT  Additional Equipment:   Intra-op Plan:   Post-operative Plan: Extubation in OR  Informed Consent: I have reviewed the patients History and Physical, chart, labs and discussed the procedure including the risks, benefits and alternatives for the proposed anesthesia with the patient or authorized representative who has indicated his/her understanding and acceptance.   Dental advisory given  Plan Discussed with: CRNA, Anesthesiologist and Surgeon  Anesthesia Plan Comments:         Anesthesia Quick Evaluation

## 2017-08-28 NOTE — Op Note (Signed)
Date of procedure: 08/28/2017  Date of dictation:  same  Service: Neurosurgery  Preoperative diagnosis: C5-6, C6-7 spondylosis with stenosis and radiculopathy  Postoperative diagnosis: same  Procedure Name: C5-6, C6-7 anterior cervical discectomy with interbody fusion utilizing interbody peek cages, locally harvested autograft, and anterior plate instrumentation  Surgeon:Phala Schraeder A.Terree Gaultney, M.D.  Asst. Surgeon: Arnoldo Morale  Anesthesia: General  Indication:patient is a 66 year old female with neck and left upper extremity pain paresthesias and weakness consistent primarily with a C6 radiculopathy but with some C7 overlay as well.. She has workup which demonstrates evidence of significant spondylosis and stenosis at C5-6 and C6-7. She is failed conservative management. She presents now for two-level anterior cervical decompression and fusion.  Operative note:after induction of anesthesia, patient position supine with neck slightly extended and held in place of halter traction. Anterior cervical region prepped and draped sterilely. Incision made overlying C6. Dissection performed on the right. Retractor placed. Fluoroscopy used. Levels confirmed. Disc spaces incised at both levels. Discectomies performed using various instruments down to level posterior annulus. Microscope then brought into field use at the remainder of the discectomies. Using high-speed drill and Kerrison rongeurs remaining aspects of annulus and osteophytes removed down to level the posterior longitudinal limb. Posterior lateral is an elevated and resected piecemeal fashion. A 9 thecal sac was identified. Wide central decompression and perform undercutting the bodies of C5 and C6. Decompression then proceeded into each neural foramen. Wide anterior foraminotomies performed on course exiting C6 nerve roots bilaterally. At this point a very thorough decompression been achieved. There was no evidence of injury to the thecal sac or nerve roots.  Procedure then repeated at C6-7 typical fashion again without complications. Wounds and irrigated one final time. Gelfoam was placed topically for hemostasis and removed. Medtronic anatomic peek cages packed with locally harvested autograft were then impacted into place at both levels. Each cage was recessed slightly from the anterior cortical margin. A 42 mm Atlantis anterior cervical plate was then placed at C5-C6 and C7 levels. This attached under fluoroscopic guidance using 13 mm variable screws 2 each at all levels. Final tightening was performed. Locking screws were engaged at all 3 levels. Final images revealed good position the cages and hardware at proper upper level with normal lamina spine. Wounds and irrigated one final time. Hemostasis was assured with bipolar chart. Wounds and close in layers of Vicryl sutures. Steri-Strips and sterile dressing were applied. No apparent complications. Patient tolerated the procedure well and she returns to the recovery room postop.

## 2017-08-28 NOTE — Anesthesia Procedure Notes (Signed)
Procedure Name: Intubation Date/Time: 08/28/2017 10:50 AM Performed by: Mariea Clonts Pre-anesthesia Checklist: Patient identified, Emergency Drugs available, Suction available and Patient being monitored Patient Re-evaluated:Patient Re-evaluated prior to induction Oxygen Delivery Method: Circle System Utilized Preoxygenation: Pre-oxygenation with 100% oxygen Induction Type: IV induction Ventilation: Mask ventilation without difficulty Laryngoscope Size: Miller and 2 Grade View: Grade I Tube type: Oral Tube size: 7.0 mm Number of attempts: 1 Airway Equipment and Method: Stylet and Oral airway Placement Confirmation: ETT inserted through vocal cords under direct vision,  positive ETCO2 and breath sounds checked- equal and bilateral Tube secured with: Tape Dental Injury: Teeth and Oropharynx as per pre-operative assessment

## 2017-08-29 ENCOUNTER — Encounter (HOSPITAL_COMMUNITY): Payer: Self-pay | Admitting: Neurosurgery

## 2017-08-29 MED ORDER — HYDROCODONE-ACETAMINOPHEN 5-325 MG PO TABS: 1.0000 | ORAL_TABLET | ORAL | 0 refills | Status: DC | PRN

## 2017-08-29 MED ORDER — CYCLOBENZAPRINE HCL 10 MG PO TABS: 10.0000 mg | ORAL_TABLET | Freq: Three times a day (TID) | ORAL | 0 refills | Status: DC | PRN

## 2017-08-29 MED FILL — Thrombin For Soln Kit 20000 Unit: CUTANEOUS | Qty: 1 | Status: AC

## 2017-08-29 NOTE — Discharge Instructions (Signed)

## 2017-08-29 NOTE — Discharge Summary (Signed)
  Physician Discharge Summary  Patient ID: Brittany Shields MRN: [id number] DOB/AGE: 66-Nov-1952 66 y.o.  Admit date: 08/28/2017 Discharge date: 08/29/2017  Admission Diagnoses:  Discharge Diagnoses:  Active Problems:   Cervical spinal stenosis   Discharged Condition: good  Hospital Course: patient admitted to the hospital where she underwent an uncomplicated 2 level anterior cervical decompression and fusion. Postoperatively she has done well. Preoperative neck and upper extremity pain improved. Ambulating without difficulty. Ready for discharge home.  Consults:   Significant Diagnostic Studies:   Treatments:   Discharge Exam: Blood pressure 101/70, pulse 80, temperature 98.4 F (36.9 C), resp. rate 18, weight 69.4 kg (153 lb), SpO2 96 %. Awake and alert. Oriented and appropriate. Crowner function intaotor sensory function extremities normal. Wound clean and dry. Chest and abdomen benign.  Disposition: 01-Home or Self Care   Allergies as of 08/29/2017      Reactions   Zithromax [azithromycin] Rash      Medication List    TAKE these medications   aspirin EC 81 MG tablet Take 81 mg by mouth daily.   aspirin-acetaminophen-caffeine 250-250-65 MG tablet Commonly known as:  EXCEDRIN MIGRAINE Take 1 tablet by mouth every 6 (six) hours as needed for headache or migraine.   AYR SALINE NASAL RINSE NA Place 1 Package into the nose 2 (two) times daily.   CALCIUM 600+D3 PO Take 1 tablet by mouth 2 (two) times daily.   Cod Liver Oil Caps Take 1 capsule by mouth daily.   cyclobenzaprine 10 MG tablet Commonly known as:  FLEXERIL Take 1 tablet (10 mg total) by mouth 3 (three) times daily as needed for muscle spasms.   docusate sodium 100 MG capsule Commonly known as:  COLACE Take 200 mg by mouth at bedtime.   DUAVEE 0.45-20 MG Tabs Generic drug:  Conj Estrogens-Bazedoxifene Take 1 tablet by mouth daily.   DUREZOL 0.05 % Emul Generic drug:  Difluprednate Apply 1  drop to eye 2 (two) times daily.   EQ RESTORE PM OP Apply 1 drop to eye at bedtime.   HYDROcodone-acetaminophen 5-325 MG tablet Commonly known as:  NORCO/VICODIN Take 1-2 tablets by mouth every 4 (four) hours as needed (breakthrough pain).   levothyroxine 50 MCG tablet Commonly known as:  SYNTHROID, LEVOTHROID Take 50 mcg by mouth daily before breakfast.   loratadine 10 MG tablet Commonly known as:  CLARITIN Take 10 mg by mouth daily. EQUATE   ofloxacin 0.3 % ophthalmic solution Commonly known as:  OCUFLOX Place 1 drop into the right eye 2 (two) times daily.   predniSONE 5 MG tablet Commonly known as:  DELTASONE Take 5 mg by mouth daily with breakfast.   PROBIOTIC DAILY Caps Take 1 capsule by mouth daily.   RABEprazole 20 MG tablet Commonly known as:  ACIPHEX Take 20 mg by mouth daily.   topiramate 100 MG tablet Commonly known as:  TOPAMAX Take 100-200 mg by mouth See admin instructions. 1 tab in the morning, 2 tabs in the evening        Signed: Rolen Conger A 08/29/2017, 9:44 AM

## 2017-08-29 NOTE — Progress Notes (Signed)
Patient alert and oriented, mae's well, voiding adequate amount of urine, swallowing without difficulty, no c/o pain at time of discharge. Patient discharged home with family. Script and discharged instructions given to patient. Patient and family stated understanding of instructions given. Patient has an appointment with Dr. Pool  

## 2017-09-25 DIAGNOSIS — H35351 Cystoid macular degeneration, right eye: Secondary | ICD-10-CM | POA: Diagnosis not present

## 2017-09-25 DIAGNOSIS — H3561 Retinal hemorrhage, right eye: Secondary | ICD-10-CM | POA: Diagnosis not present

## 2017-09-25 DIAGNOSIS — H348112 Central retinal vein occlusion, right eye, stable: Secondary | ICD-10-CM | POA: Diagnosis not present

## 2017-09-28 DIAGNOSIS — R030 Elevated blood-pressure reading, without diagnosis of hypertension: Secondary | ICD-10-CM | POA: Diagnosis not present

## 2017-09-28 DIAGNOSIS — M5412 Radiculopathy, cervical region: Secondary | ICD-10-CM | POA: Diagnosis not present

## 2017-09-28 DIAGNOSIS — Z6827 Body mass index (BMI) 27.0-27.9, adult: Secondary | ICD-10-CM | POA: Diagnosis not present

## 2017-10-18 DIAGNOSIS — Z6826 Body mass index (BMI) 26.0-26.9, adult: Secondary | ICD-10-CM | POA: Diagnosis not present

## 2017-10-18 DIAGNOSIS — J019 Acute sinusitis, unspecified: Secondary | ICD-10-CM | POA: Diagnosis not present

## 2017-10-18 DIAGNOSIS — J209 Acute bronchitis, unspecified: Secondary | ICD-10-CM | POA: Diagnosis not present

## 2017-10-24 DIAGNOSIS — H2512 Age-related nuclear cataract, left eye: Secondary | ICD-10-CM | POA: Diagnosis not present

## 2017-10-24 DIAGNOSIS — H25012 Cortical age-related cataract, left eye: Secondary | ICD-10-CM | POA: Diagnosis not present

## 2017-10-26 DIAGNOSIS — N183 Chronic kidney disease, stage 3 (moderate): Secondary | ICD-10-CM | POA: Diagnosis not present

## 2017-10-26 DIAGNOSIS — R030 Elevated blood-pressure reading, without diagnosis of hypertension: Secondary | ICD-10-CM | POA: Diagnosis not present

## 2017-10-26 DIAGNOSIS — K210 Gastro-esophageal reflux disease with esophagitis: Secondary | ICD-10-CM | POA: Diagnosis not present

## 2017-10-26 DIAGNOSIS — E782 Mixed hyperlipidemia: Secondary | ICD-10-CM | POA: Diagnosis not present

## 2017-10-26 DIAGNOSIS — M797 Fibromyalgia: Secondary | ICD-10-CM | POA: Diagnosis not present

## 2017-10-26 DIAGNOSIS — E039 Hypothyroidism, unspecified: Secondary | ICD-10-CM | POA: Diagnosis not present

## 2017-10-26 DIAGNOSIS — Z6827 Body mass index (BMI) 27.0-27.9, adult: Secondary | ICD-10-CM | POA: Diagnosis not present

## 2017-10-26 DIAGNOSIS — M5412 Radiculopathy, cervical region: Secondary | ICD-10-CM | POA: Diagnosis not present

## 2017-10-30 DIAGNOSIS — M5030 Other cervical disc degeneration, unspecified cervical region: Secondary | ICD-10-CM | POA: Diagnosis not present

## 2017-10-30 DIAGNOSIS — M797 Fibromyalgia: Secondary | ICD-10-CM | POA: Diagnosis not present

## 2017-10-30 DIAGNOSIS — E039 Hypothyroidism, unspecified: Secondary | ICD-10-CM | POA: Diagnosis not present

## 2017-10-30 DIAGNOSIS — M3130 Wegener's granulomatosis without renal involvement: Secondary | ICD-10-CM | POA: Diagnosis not present

## 2017-10-30 DIAGNOSIS — Z6825 Body mass index (BMI) 25.0-25.9, adult: Secondary | ICD-10-CM | POA: Diagnosis not present

## 2017-10-30 DIAGNOSIS — R300 Dysuria: Secondary | ICD-10-CM | POA: Diagnosis not present

## 2017-10-30 DIAGNOSIS — G43909 Migraine, unspecified, not intractable, without status migrainosus: Secondary | ICD-10-CM | POA: Diagnosis not present

## 2017-10-30 DIAGNOSIS — E782 Mixed hyperlipidemia: Secondary | ICD-10-CM | POA: Diagnosis not present

## 2017-10-31 DIAGNOSIS — Z1231 Encounter for screening mammogram for malignant neoplasm of breast: Secondary | ICD-10-CM | POA: Diagnosis not present

## 2017-11-08 ENCOUNTER — Encounter (HOSPITAL_COMMUNITY)
Admission: RE | Admit: 2017-11-08 | Discharge: 2017-11-08 | Disposition: A | Discharge status: Home / Self Care | Payer: Medicare Other | Source: Ambulatory Visit | Attending: Ophthalmology | Admitting: Ophthalmology

## 2017-11-08 ENCOUNTER — Encounter (HOSPITAL_COMMUNITY): Payer: Self-pay

## 2017-11-10 ENCOUNTER — Ambulatory Visit (HOSPITAL_COMMUNITY)
Admission: RE | Admit: 2017-11-10 | Discharge: 2017-11-10 | Disposition: A | Discharge status: Home / Self Care | Payer: Medicare Other | Source: Ambulatory Visit | Attending: Ophthalmology | Admitting: Ophthalmology

## 2017-11-10 ENCOUNTER — Ambulatory Visit (HOSPITAL_COMMUNITY): Payer: Medicare Other | Admitting: Anesthesiology

## 2017-11-10 ENCOUNTER — Encounter (HOSPITAL_COMMUNITY)
Admission: RE | Disposition: A | Discharge status: Home / Self Care | Payer: Self-pay | Source: Ambulatory Visit | Attending: Ophthalmology

## 2017-11-10 DIAGNOSIS — M1990 Unspecified osteoarthritis, unspecified site: Secondary | ICD-10-CM | POA: Insufficient documentation

## 2017-11-10 DIAGNOSIS — Z79899 Other long term (current) drug therapy: Secondary | ICD-10-CM | POA: Insufficient documentation

## 2017-11-10 DIAGNOSIS — Z7982 Long term (current) use of aspirin: Secondary | ICD-10-CM | POA: Diagnosis not present

## 2017-11-10 DIAGNOSIS — K219 Gastro-esophageal reflux disease without esophagitis: Secondary | ICD-10-CM | POA: Diagnosis not present

## 2017-11-10 DIAGNOSIS — R51 Headache: Secondary | ICD-10-CM | POA: Diagnosis not present

## 2017-11-10 DIAGNOSIS — H269 Unspecified cataract: Secondary | ICD-10-CM | POA: Diagnosis present

## 2017-11-10 DIAGNOSIS — E039 Hypothyroidism, unspecified: Secondary | ICD-10-CM | POA: Diagnosis not present

## 2017-11-10 DIAGNOSIS — H2512 Age-related nuclear cataract, left eye: Secondary | ICD-10-CM | POA: Diagnosis not present

## 2017-11-10 DIAGNOSIS — H25012 Cortical age-related cataract, left eye: Secondary | ICD-10-CM | POA: Diagnosis not present

## 2017-11-10 HISTORY — PX: CATARACT EXTRACTION W/PHACO: SHX586

## 2017-11-10 SURGERY — PHACOEMULSIFICATION, CATARACT, WITH IOL INSERTION
Anesthesia: Monitor Anesthesia Care | Site: Eye | Laterality: Left

## 2017-11-10 MED ORDER — TETRACAINE 0.5 % OP SOLN OPTIME - NO CHARGE
OPHTHALMIC | Status: DC | PRN
  Administered 2017-11-10: 2 [drp] via OPHTHALMIC

## 2017-11-10 MED ORDER — MIDAZOLAM HCL 2 MG/2ML IJ SOLN
INTRAMUSCULAR | Status: AC
  Filled 2017-11-10: qty 2

## 2017-11-10 MED ORDER — PROVISC 10 MG/ML IO SOLN
INTRAOCULAR | Status: DC | PRN
  Administered 2017-11-10: 0.85 mL via INTRAOCULAR

## 2017-11-10 MED ORDER — CYCLOPENTOLATE-PHENYLEPHRINE 0.2-1 % OP SOLN
1.0000 [drp] | OPHTHALMIC | Status: AC
  Administered 2017-11-10 (×3): 1 [drp] via OPHTHALMIC

## 2017-11-10 MED ORDER — PHENYLEPHRINE HCL 2.5 % OP SOLN
1.0000 [drp] | OPHTHALMIC | Status: AC
  Administered 2017-11-10 (×3): 1 [drp] via OPHTHALMIC

## 2017-11-10 MED ORDER — TETRACAINE HCL 0.5 % OP SOLN
1.0000 [drp] | OPHTHALMIC | Status: AC
  Administered 2017-11-10 (×3): 1 [drp] via OPHTHALMIC

## 2017-11-10 MED ORDER — FENTANYL CITRATE (PF) 100 MCG/2ML IJ SOLN
25.0000 ug | Freq: Once | INTRAMUSCULAR | Status: AC
  Administered 2017-11-10: 25 ug via INTRAVENOUS

## 2017-11-10 MED ORDER — LACTATED RINGERS IV SOLN
INTRAVENOUS | Status: DC
  Administered 2017-11-10: 1000 mL via INTRAVENOUS

## 2017-11-10 MED ORDER — MIDAZOLAM HCL 2 MG/2ML IJ SOLN
1.0000 mg | INTRAMUSCULAR | Status: AC
  Administered 2017-11-10: 2 mg via INTRAVENOUS

## 2017-11-10 MED ORDER — BSS IO SOLN
INTRAOCULAR | Status: DC | PRN
  Administered 2017-11-10: 15 mL

## 2017-11-10 MED ORDER — FENTANYL CITRATE (PF) 100 MCG/2ML IJ SOLN
INTRAMUSCULAR | Status: AC
  Filled 2017-11-10: qty 2

## 2017-11-10 MED ORDER — EPINEPHRINE PF 1 MG/ML IJ SOLN
INTRAOCULAR | Status: DC | PRN
  Administered 2017-11-10: 500 mL

## 2017-11-10 MED ORDER — KETOROLAC TROMETHAMINE 0.5 % OP SOLN
1.0000 [drp] | OPHTHALMIC | Status: AC
  Administered 2017-11-10 (×3): 1 [drp] via OPHTHALMIC

## 2017-11-10 SURGICAL SUPPLY — 10 items
CLOTH BEACON ORANGE TIMEOUT ST (SAFETY) ×2 IMPLANT
EYE SHIELD UNIVERSAL CLEAR (GAUZE/BANDAGES/DRESSINGS) ×2 IMPLANT
GLOVE BIO SURGEON STRL SZ 6.5 (GLOVE) ×2 IMPLANT
GLOVE BIOGEL PI IND STRL 7.0 (GLOVE) ×1 IMPLANT
GLOVE BIOGEL PI INDICATOR 7.0 (GLOVE) ×1
LENS ALC ACRYL/TECN (Ophthalmic Related) ×2 IMPLANT
PAD ARMBOARD 7.5X6 YLW CONV (MISCELLANEOUS) ×2 IMPLANT
TAPE SURG TRANSPORE 1 IN (GAUZE/BANDAGES/DRESSINGS) ×1 IMPLANT
TAPE SURGICAL TRANSPORE 1 IN (GAUZE/BANDAGES/DRESSINGS) ×1
WATER STERILE IRR 250ML POUR (IV SOLUTION) ×2 IMPLANT

## 2017-11-10 NOTE — Anesthesia Postprocedure Evaluation (Signed)
Anesthesia Post Note  Patient: Brittany Shields  Procedure(s) Performed: CATARACT EXTRACTION PHACO AND INTRAOCULAR LENS PLACEMENT ([place]) (Left Eye)  Patient location during evaluation: Short Stay Anesthesia Type: MAC Level of consciousness: awake and alert and patient cooperative Pain management: pain level controlled Vital Signs Assessment: post-procedure vital signs reviewed and stable Respiratory status: spontaneous breathing, nonlabored ventilation and respiratory function stable Cardiovascular status: blood pressure returned to baseline Postop Assessment: no apparent nausea or vomiting Anesthetic complications: no     Last Vitals:  Vitals:   11/10/17 0805 11/10/17 0810  BP: 124/71 113/70  Pulse:    Resp: 17 18  Temp:    SpO2: 94% 93%    Last Pain: There were no vitals filed for this visit.               Erle Guster J

## 2017-11-10 NOTE — H&P (Signed)
The patient was re examined and there is no change in the patients condition since the original H and P. 

## 2017-11-10 NOTE — Op Note (Signed)
Patient brought to the operating room and prepped and draped in the usual manner.  Lid speculum inserted in left eye.  Stab incision made at the twelve o'clock position.  Provisc instilled in the anterior chamber.   A 2.4 mm. Stab incision was made temporally.  An anterior capsulotomy was done with a bent 25 gauge needle.  The nucleus was hydrodissected.  The Phaco tip was inserted in the anterior chamber and the nucleus was emulsified.  CDE was 3.27.  The cortical material was then removed with the I and A tip.  Posterior capsule was the polished.  The anterior chamber was deepened with Provisc.  A 20.5 Diopter Alcon [id number] IOL was then inserted in the capsular bag.  Provisc was then removed with the I and A tip.  The wound was then hydrated.  Patient sent to the Recovery Room in good condition with follow up in my office.  Preoperative Diagnosis:  Nuclear Cataract OS Postoperative Diagnosis:  Same Procedure name: Kelman Phacoemulsification OS with IOL

## 2017-11-10 NOTE — Transfer of Care (Signed)
Immediate Anesthesia Transfer of Care Note  Patient: Brittany Shields  Procedure(s) Performed: CATARACT EXTRACTION PHACO AND INTRAOCULAR LENS PLACEMENT ([place]) (Left Eye)  Patient Location: Short Stay  Anesthesia Type:MAC  Level of Consciousness: awake  Airway & Oxygen Therapy: Patient Spontanous Breathing  Post-op Assessment: Report given to RN  Post vital signs: Reviewed and stable  Last Vitals:  Vitals:   11/10/17 0805 11/10/17 0810  BP: 124/71 113/70  Pulse:    Resp: 17 18  Temp:    SpO2: 94% 93%    Last Pain: There were no vitals filed for this visit.       Complications: No apparent anesthesia complications

## 2017-11-10 NOTE — Anesthesia Preprocedure Evaluation (Signed)
Anesthesia Evaluation  Patient identified by MRN, date of birth, ID band Patient awake    Reviewed: Allergy & Precautions, NPO status , Patient's Chart, lab work & pertinent test results  Airway Mallampati: I  TM Distance: >3 FB Neck ROM: Full    Dental  (+) Teeth Intact, Caps,    Pulmonary neg pulmonary ROS,    breath sounds clear to auscultation       Cardiovascular negative cardio ROS   Rhythm:Regular Rate:Normal     Neuro/Psych  Headaches,    GI/Hepatic GERD  ,  Endo/Other  Hypothyroidism   Renal/GU      Musculoskeletal   Abdominal   Peds  Hematology   Anesthesia Other Findings   Reproductive/Obstetrics                             Anesthesia Physical Anesthesia Plan  ASA: II  Anesthesia Plan: MAC   Post-op Pain Management:    Induction: Intravenous  PONV Risk Score and Plan:   Airway Management Planned: Nasal Cannula  Additional Equipment:   Intra-op Plan:   Post-operative Plan:   Informed Consent: I have reviewed the patients History and Physical, chart, labs and discussed the procedure including the risks, benefits and alternatives for the proposed anesthesia with the patient or authorized representative who has indicated his/her understanding and acceptance.     Plan Discussed with:   Anesthesia Plan Comments:         Anesthesia Quick Evaluation

## 2017-11-10 NOTE — Discharge Instructions (Signed)
°  °          [facility] Instructions [place] [address]-      1. Avoid closing eyes tightly. One often closes the eye tightly when laughing, talking, sneezing, coughing or if they feel irritated. At these times, you should be careful not to close your eyes tightly.  2. Instill eye drops as instructed. To instill drops in your eye, open it, look up and have someone gently pull the lower lid down and instill a couple of drops inside the lower lid.  3. Do not touch upper lid.  4. Take Advil or Tylenol for pain.  5. You may use either eye for near work, such as reading or sewing and you may watch television.  6. You may have your hair done at the beauty parlor at any time.  7. Wear dark glasses with or without your own glasses if you are in bright light.  8. Call our office at [phone] or [phone] if you have sharp pain in your eye or unusual symptoms.  9.  FOLLOW UP WITH DR. SHAPIRO TODAY IN HIS [place] OFFICE AT 10:45am-11:00am..    I have received a copy of the above instructions and will follow them.     IF YOU ARE IN IMMEDIATE DANGER CALL 911!  It is important for you to keep your follow-up appointment with your physician after discharge, OR, for you /your caregiver to make a follow-up appointment with your physician / medical provider after discharge.  Show these instructions to the next healthcare provider you see.   PATIENT INSTRUCTIONS POST-ANESTHESIA  IMMEDIATELY FOLLOWING SURGERY:  Do not drive or operate machinery for the first twenty four hours after surgery.  Do not make any important decisions for twenty four hours after surgery or while taking narcotic pain medications or sedatives.  If you develop intractable nausea and vomiting or a severe headache please notify your doctor immediately.  FOLLOW-UP:  Please make an appointment with your surgeon as instructed. You do not need to follow up with anesthesia  unless specifically instructed to do so.  WOUND CARE INSTRUCTIONS (if applicable):  Keep a dry clean dressing on the anesthesia/puncture wound site if there is drainage.  Once the wound has quit draining you may leave it open to air.  Generally you should leave the bandage intact for twenty four hours unless there is drainage.  If the epidural site drains for more than 36-48 hours please call the anesthesia department.  QUESTIONS?:  Please feel free to call your physician or the hospital operator if you have any questions, and they will be happy to assist you.

## 2017-11-13 ENCOUNTER — Encounter (HOSPITAL_COMMUNITY): Payer: Self-pay | Admitting: Ophthalmology

## 2017-12-12 DIAGNOSIS — Z6825 Body mass index (BMI) 25.0-25.9, adult: Secondary | ICD-10-CM | POA: Diagnosis not present

## 2017-12-12 DIAGNOSIS — R509 Fever, unspecified: Secondary | ICD-10-CM | POA: Diagnosis not present

## 2017-12-12 DIAGNOSIS — M25551 Pain in right hip: Secondary | ICD-10-CM | POA: Diagnosis not present

## 2017-12-12 DIAGNOSIS — M545 Low back pain: Secondary | ICD-10-CM | POA: Diagnosis not present

## 2017-12-26 ENCOUNTER — Ambulatory Visit (INDEPENDENT_AMBULATORY_CARE_PROVIDER_SITE_OTHER): Payer: Medicare Other | Admitting: Urology

## 2017-12-26 DIAGNOSIS — R3915 Urgency of urination: Secondary | ICD-10-CM | POA: Diagnosis not present

## 2017-12-26 DIAGNOSIS — N3281 Overactive bladder: Secondary | ICD-10-CM | POA: Diagnosis not present

## 2018-01-02 DIAGNOSIS — H16223 Keratoconjunctivitis sicca, not specified as Sjogren's, bilateral: Secondary | ICD-10-CM | POA: Diagnosis not present

## 2018-01-02 DIAGNOSIS — H04123 Dry eye syndrome of bilateral lacrimal glands: Secondary | ICD-10-CM | POA: Diagnosis not present

## 2018-01-16 DIAGNOSIS — H16223 Keratoconjunctivitis sicca, not specified as Sjogren's, bilateral: Secondary | ICD-10-CM | POA: Diagnosis not present

## 2018-01-16 DIAGNOSIS — H04123 Dry eye syndrome of bilateral lacrimal glands: Secondary | ICD-10-CM | POA: Diagnosis not present

## 2018-01-19 DIAGNOSIS — R509 Fever, unspecified: Secondary | ICD-10-CM | POA: Diagnosis not present

## 2018-01-19 DIAGNOSIS — Z6826 Body mass index (BMI) 26.0-26.9, adult: Secondary | ICD-10-CM | POA: Diagnosis not present

## 2018-01-19 DIAGNOSIS — J0101 Acute recurrent maxillary sinusitis: Secondary | ICD-10-CM | POA: Diagnosis not present

## 2018-02-06 DIAGNOSIS — H35041 Retinal micro-aneurysms, unspecified, right eye: Secondary | ICD-10-CM | POA: Diagnosis not present

## 2018-02-06 DIAGNOSIS — H34821 Venous engorgement, right eye: Secondary | ICD-10-CM | POA: Diagnosis not present

## 2018-02-06 DIAGNOSIS — H348112 Central retinal vein occlusion, right eye, stable: Secondary | ICD-10-CM | POA: Diagnosis not present

## 2018-02-06 DIAGNOSIS — H3561 Retinal hemorrhage, right eye: Secondary | ICD-10-CM | POA: Diagnosis not present

## 2018-02-16 DIAGNOSIS — R21 Rash and other nonspecific skin eruption: Secondary | ICD-10-CM | POA: Diagnosis not present

## 2018-02-16 DIAGNOSIS — Z6826 Body mass index (BMI) 26.0-26.9, adult: Secondary | ICD-10-CM | POA: Diagnosis not present

## 2018-02-27 ENCOUNTER — Ambulatory Visit (INDEPENDENT_AMBULATORY_CARE_PROVIDER_SITE_OTHER): Payer: Medicare Other | Admitting: Urology

## 2018-02-27 DIAGNOSIS — R3915 Urgency of urination: Secondary | ICD-10-CM

## 2018-02-27 DIAGNOSIS — N3281 Overactive bladder: Secondary | ICD-10-CM | POA: Diagnosis not present

## 2018-02-28 DIAGNOSIS — M5030 Other cervical disc degeneration, unspecified cervical region: Secondary | ICD-10-CM | POA: Diagnosis not present

## 2018-02-28 DIAGNOSIS — K219 Gastro-esophageal reflux disease without esophagitis: Secondary | ICD-10-CM | POA: Diagnosis not present

## 2018-02-28 DIAGNOSIS — Z1389 Encounter for screening for other disorder: Secondary | ICD-10-CM | POA: Diagnosis not present

## 2018-02-28 DIAGNOSIS — Z6826 Body mass index (BMI) 26.0-26.9, adult: Secondary | ICD-10-CM | POA: Diagnosis not present

## 2018-02-28 DIAGNOSIS — E782 Mixed hyperlipidemia: Secondary | ICD-10-CM | POA: Diagnosis not present

## 2018-02-28 DIAGNOSIS — G43909 Migraine, unspecified, not intractable, without status migrainosus: Secondary | ICD-10-CM | POA: Diagnosis not present

## 2018-02-28 DIAGNOSIS — Z23 Encounter for immunization: Secondary | ICD-10-CM | POA: Diagnosis not present

## 2018-02-28 DIAGNOSIS — E039 Hypothyroidism, unspecified: Secondary | ICD-10-CM | POA: Diagnosis not present

## 2018-02-28 DIAGNOSIS — M797 Fibromyalgia: Secondary | ICD-10-CM | POA: Diagnosis not present

## 2018-02-28 DIAGNOSIS — J301 Allergic rhinitis due to pollen: Secondary | ICD-10-CM | POA: Diagnosis not present

## 2018-02-28 DIAGNOSIS — Z1212 Encounter for screening for malignant neoplasm of rectum: Secondary | ICD-10-CM | POA: Diagnosis not present

## 2018-02-28 DIAGNOSIS — R300 Dysuria: Secondary | ICD-10-CM | POA: Diagnosis not present

## 2018-02-28 DIAGNOSIS — M3130 Wegener's granulomatosis without renal involvement: Secondary | ICD-10-CM | POA: Diagnosis not present

## 2018-02-28 DIAGNOSIS — Z9189 Other specified personal risk factors, not elsewhere classified: Secondary | ICD-10-CM | POA: Diagnosis not present

## 2018-02-28 DIAGNOSIS — Z1331 Encounter for screening for depression: Secondary | ICD-10-CM | POA: Diagnosis not present

## 2018-03-06 DIAGNOSIS — H04123 Dry eye syndrome of bilateral lacrimal glands: Secondary | ICD-10-CM | POA: Diagnosis not present

## 2018-03-06 DIAGNOSIS — H16223 Keratoconjunctivitis sicca, not specified as Sjogren's, bilateral: Secondary | ICD-10-CM | POA: Diagnosis not present

## 2018-03-06 DIAGNOSIS — Z961 Presence of intraocular lens: Secondary | ICD-10-CM | POA: Diagnosis not present

## 2018-05-09 DIAGNOSIS — M1711 Unilateral primary osteoarthritis, right knee: Secondary | ICD-10-CM | POA: Diagnosis not present

## 2018-05-09 DIAGNOSIS — Z6827 Body mass index (BMI) 27.0-27.9, adult: Secondary | ICD-10-CM | POA: Diagnosis not present

## 2018-05-11 DIAGNOSIS — E782 Mixed hyperlipidemia: Secondary | ICD-10-CM | POA: Diagnosis not present

## 2018-05-11 DIAGNOSIS — G43909 Migraine, unspecified, not intractable, without status migrainosus: Secondary | ICD-10-CM | POA: Diagnosis not present

## 2018-05-11 DIAGNOSIS — E039 Hypothyroidism, unspecified: Secondary | ICD-10-CM | POA: Diagnosis not present

## 2018-05-15 DIAGNOSIS — R0982 Postnasal drip: Secondary | ICD-10-CM | POA: Diagnosis not present

## 2018-05-15 DIAGNOSIS — K219 Gastro-esophageal reflux disease without esophagitis: Secondary | ICD-10-CM | POA: Diagnosis not present

## 2018-05-15 DIAGNOSIS — J328 Other chronic sinusitis: Secondary | ICD-10-CM | POA: Diagnosis not present

## 2018-05-15 DIAGNOSIS — J310 Chronic rhinitis: Secondary | ICD-10-CM | POA: Diagnosis not present

## 2018-05-16 DIAGNOSIS — Z6826 Body mass index (BMI) 26.0-26.9, adult: Secondary | ICD-10-CM | POA: Diagnosis not present

## 2018-05-16 DIAGNOSIS — M545 Low back pain: Secondary | ICD-10-CM | POA: Diagnosis not present

## 2018-05-21 DIAGNOSIS — Z6827 Body mass index (BMI) 27.0-27.9, adult: Secondary | ICD-10-CM | POA: Diagnosis not present

## 2018-05-21 DIAGNOSIS — M545 Low back pain: Secondary | ICD-10-CM | POA: Diagnosis not present

## 2018-05-23 IMAGING — RF DG CERVICAL SPINE 2 OR 3 VIEWS
1 series · 2 of 2 positions shown · non-contrast
Comparison: None.

CLINICAL DATA: ACDF C5 through C7

EXAM:
DG C-ARM 61-120 MIN; CERVICAL SPINE - 2-3 VIEW

[Series 1: run · 2 of 2 slices shown]
[im 1/2]
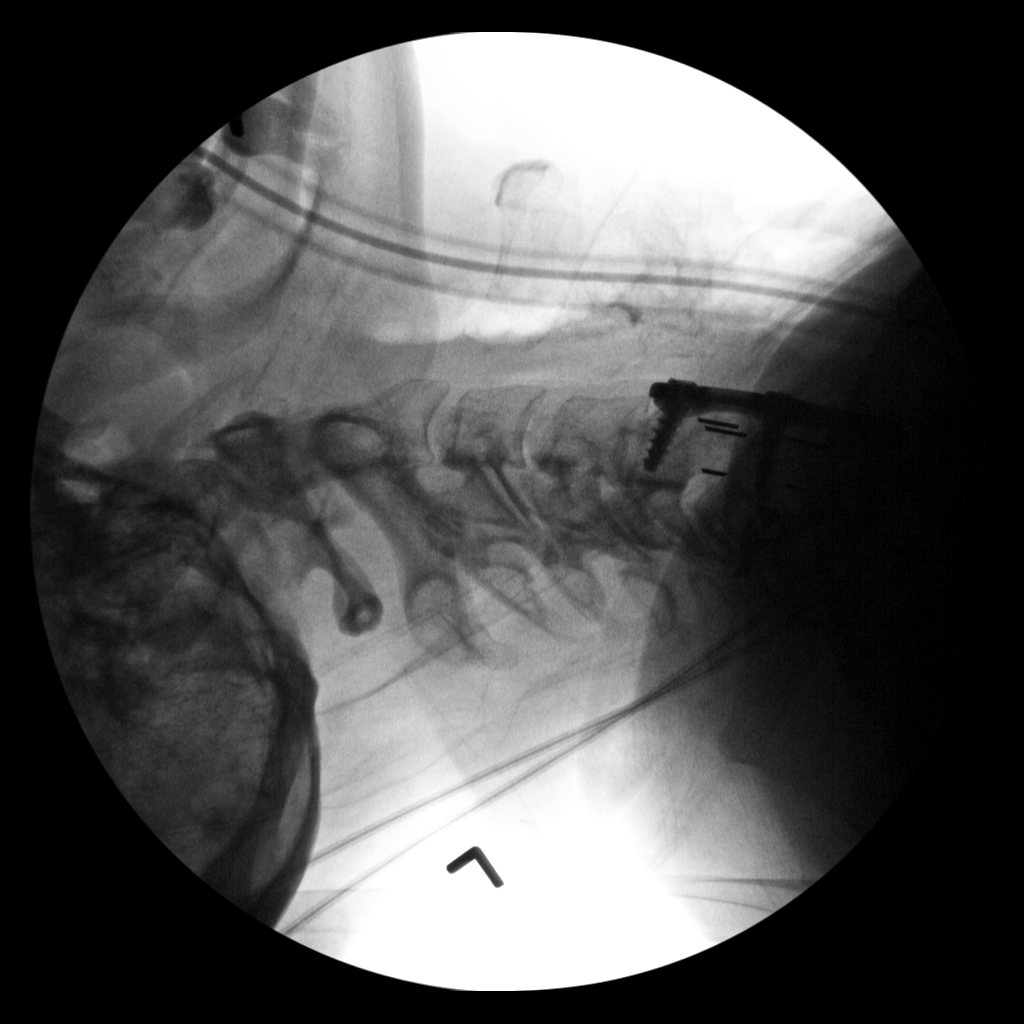
[im 2/2]
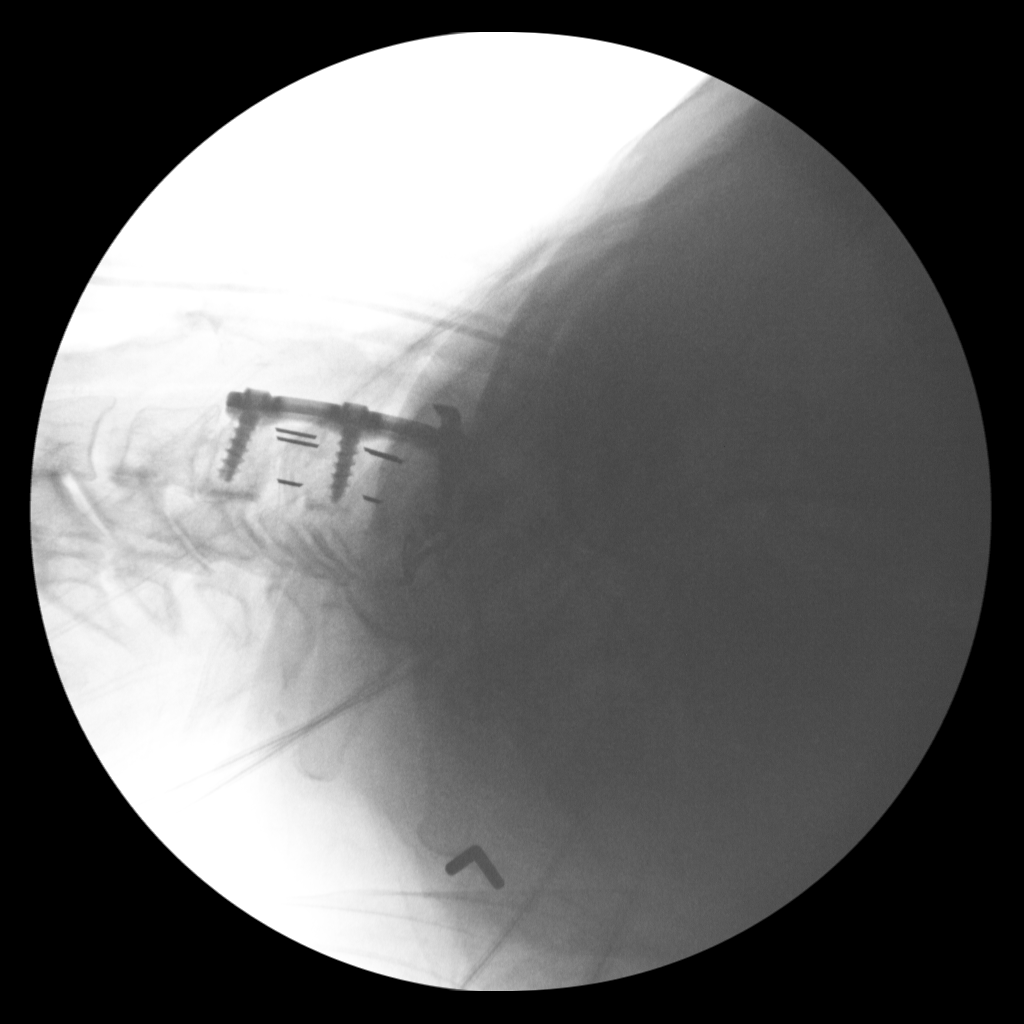

[2 of 2 positions shown; findings below may reference images not displayed]

FINDINGS: ACDF C5-6 and C6-7 in satisfactory position. Anterior plate and
screws and interbody spacers in good position. Normal cervical
alignment.
IMPRESSION: ACDF C5-6 and C6-7

## 2018-05-25 DIAGNOSIS — H02883 Meibomian gland dysfunction of right eye, unspecified eyelid: Secondary | ICD-10-CM | POA: Diagnosis not present

## 2018-05-25 DIAGNOSIS — Z961 Presence of intraocular lens: Secondary | ICD-10-CM | POA: Diagnosis not present

## 2018-05-25 DIAGNOSIS — H04123 Dry eye syndrome of bilateral lacrimal glands: Secondary | ICD-10-CM | POA: Diagnosis not present

## 2018-05-25 DIAGNOSIS — H26491 Other secondary cataract, right eye: Secondary | ICD-10-CM | POA: Diagnosis not present

## 2018-05-25 DIAGNOSIS — H02886 Meibomian gland dysfunction of left eye, unspecified eyelid: Secondary | ICD-10-CM | POA: Diagnosis not present

## 2018-05-28 DIAGNOSIS — M5126 Other intervertebral disc displacement, lumbar region: Secondary | ICD-10-CM | POA: Diagnosis not present

## 2018-05-28 DIAGNOSIS — M5136 Other intervertebral disc degeneration, lumbar region: Secondary | ICD-10-CM | POA: Diagnosis not present

## 2018-05-28 DIAGNOSIS — M545 Low back pain: Secondary | ICD-10-CM | POA: Diagnosis not present

## 2018-06-27 DIAGNOSIS — H26491 Other secondary cataract, right eye: Secondary | ICD-10-CM | POA: Diagnosis not present

## 2018-06-27 DIAGNOSIS — H04123 Dry eye syndrome of bilateral lacrimal glands: Secondary | ICD-10-CM | POA: Diagnosis not present

## 2018-06-27 DIAGNOSIS — Z961 Presence of intraocular lens: Secondary | ICD-10-CM | POA: Diagnosis not present

## 2018-06-27 DIAGNOSIS — H0288A Meibomian gland dysfunction right eye, upper and lower eyelids: Secondary | ICD-10-CM | POA: Diagnosis not present

## 2018-06-27 DIAGNOSIS — H0288B Meibomian gland dysfunction left eye, upper and lower eyelids: Secondary | ICD-10-CM | POA: Diagnosis not present

## 2018-07-02 DIAGNOSIS — M25461 Effusion, right knee: Secondary | ICD-10-CM | POA: Diagnosis not present

## 2018-07-02 DIAGNOSIS — M25561 Pain in right knee: Secondary | ICD-10-CM | POA: Diagnosis not present

## 2018-07-02 DIAGNOSIS — Z6827 Body mass index (BMI) 27.0-27.9, adult: Secondary | ICD-10-CM | POA: Diagnosis not present

## 2018-07-02 DIAGNOSIS — M1711 Unilateral primary osteoarthritis, right knee: Secondary | ICD-10-CM | POA: Diagnosis not present

## 2018-07-09 DIAGNOSIS — M11261 Other chondrocalcinosis, right knee: Secondary | ICD-10-CM | POA: Diagnosis not present

## 2018-07-09 DIAGNOSIS — Z6827 Body mass index (BMI) 27.0-27.9, adult: Secondary | ICD-10-CM | POA: Diagnosis not present

## 2018-07-10 DIAGNOSIS — M1711 Unilateral primary osteoarthritis, right knee: Secondary | ICD-10-CM | POA: Diagnosis not present

## 2018-07-10 DIAGNOSIS — M25561 Pain in right knee: Secondary | ICD-10-CM | POA: Diagnosis not present

## 2018-07-10 DIAGNOSIS — M545 Low back pain: Secondary | ICD-10-CM | POA: Diagnosis not present

## 2018-07-10 DIAGNOSIS — R262 Difficulty in walking, not elsewhere classified: Secondary | ICD-10-CM | POA: Diagnosis not present

## 2018-07-10 DIAGNOSIS — M6281 Muscle weakness (generalized): Secondary | ICD-10-CM | POA: Diagnosis not present

## 2018-07-12 DIAGNOSIS — M6281 Muscle weakness (generalized): Secondary | ICD-10-CM | POA: Diagnosis not present

## 2018-07-12 DIAGNOSIS — R262 Difficulty in walking, not elsewhere classified: Secondary | ICD-10-CM | POA: Diagnosis not present

## 2018-07-12 DIAGNOSIS — M25561 Pain in right knee: Secondary | ICD-10-CM | POA: Diagnosis not present

## 2018-07-12 DIAGNOSIS — M545 Low back pain: Secondary | ICD-10-CM | POA: Diagnosis not present

## 2018-07-12 DIAGNOSIS — M1711 Unilateral primary osteoarthritis, right knee: Secondary | ICD-10-CM | POA: Diagnosis not present

## 2018-07-17 DIAGNOSIS — M6281 Muscle weakness (generalized): Secondary | ICD-10-CM | POA: Diagnosis not present

## 2018-07-17 DIAGNOSIS — M1711 Unilateral primary osteoarthritis, right knee: Secondary | ICD-10-CM | POA: Diagnosis not present

## 2018-07-17 DIAGNOSIS — M545 Low back pain: Secondary | ICD-10-CM | POA: Diagnosis not present

## 2018-07-17 DIAGNOSIS — R262 Difficulty in walking, not elsewhere classified: Secondary | ICD-10-CM | POA: Diagnosis not present

## 2018-07-17 DIAGNOSIS — M25561 Pain in right knee: Secondary | ICD-10-CM | POA: Diagnosis not present

## 2018-07-20 DIAGNOSIS — M6281 Muscle weakness (generalized): Secondary | ICD-10-CM | POA: Diagnosis not present

## 2018-07-20 DIAGNOSIS — M25561 Pain in right knee: Secondary | ICD-10-CM | POA: Diagnosis not present

## 2018-07-20 DIAGNOSIS — M1711 Unilateral primary osteoarthritis, right knee: Secondary | ICD-10-CM | POA: Diagnosis not present

## 2018-07-20 DIAGNOSIS — M545 Low back pain: Secondary | ICD-10-CM | POA: Diagnosis not present

## 2018-07-20 DIAGNOSIS — R262 Difficulty in walking, not elsewhere classified: Secondary | ICD-10-CM | POA: Diagnosis not present

## 2018-07-24 DIAGNOSIS — M25561 Pain in right knee: Secondary | ICD-10-CM | POA: Diagnosis not present

## 2018-07-24 DIAGNOSIS — M1711 Unilateral primary osteoarthritis, right knee: Secondary | ICD-10-CM | POA: Diagnosis not present

## 2018-07-24 DIAGNOSIS — R262 Difficulty in walking, not elsewhere classified: Secondary | ICD-10-CM | POA: Diagnosis not present

## 2018-07-24 DIAGNOSIS — M6281 Muscle weakness (generalized): Secondary | ICD-10-CM | POA: Diagnosis not present

## 2018-07-24 DIAGNOSIS — M545 Low back pain: Secondary | ICD-10-CM | POA: Diagnosis not present

## 2018-07-25 DIAGNOSIS — R262 Difficulty in walking, not elsewhere classified: Secondary | ICD-10-CM | POA: Diagnosis not present

## 2018-07-25 DIAGNOSIS — M25561 Pain in right knee: Secondary | ICD-10-CM | POA: Diagnosis not present

## 2018-07-25 DIAGNOSIS — M1711 Unilateral primary osteoarthritis, right knee: Secondary | ICD-10-CM | POA: Diagnosis not present

## 2018-07-25 DIAGNOSIS — M6281 Muscle weakness (generalized): Secondary | ICD-10-CM | POA: Diagnosis not present

## 2018-07-25 DIAGNOSIS — M545 Low back pain: Secondary | ICD-10-CM | POA: Diagnosis not present

## 2018-07-27 DIAGNOSIS — E039 Hypothyroidism, unspecified: Secondary | ICD-10-CM | POA: Diagnosis not present

## 2018-07-27 DIAGNOSIS — Z9189 Other specified personal risk factors, not elsewhere classified: Secondary | ICD-10-CM | POA: Diagnosis not present

## 2018-07-27 DIAGNOSIS — N183 Chronic kidney disease, stage 3 (moderate): Secondary | ICD-10-CM | POA: Diagnosis not present

## 2018-07-27 DIAGNOSIS — E782 Mixed hyperlipidemia: Secondary | ICD-10-CM | POA: Diagnosis not present

## 2018-07-27 DIAGNOSIS — K210 Gastro-esophageal reflux disease with esophagitis: Secondary | ICD-10-CM | POA: Diagnosis not present

## 2018-08-02 DIAGNOSIS — M545 Low back pain: Secondary | ICD-10-CM | POA: Diagnosis not present

## 2018-08-02 DIAGNOSIS — R262 Difficulty in walking, not elsewhere classified: Secondary | ICD-10-CM | POA: Diagnosis not present

## 2018-08-02 DIAGNOSIS — M25561 Pain in right knee: Secondary | ICD-10-CM | POA: Diagnosis not present

## 2018-08-02 DIAGNOSIS — M6281 Muscle weakness (generalized): Secondary | ICD-10-CM | POA: Diagnosis not present

## 2018-08-02 DIAGNOSIS — M1711 Unilateral primary osteoarthritis, right knee: Secondary | ICD-10-CM | POA: Diagnosis not present

## 2018-08-03 DIAGNOSIS — Z6827 Body mass index (BMI) 27.0-27.9, adult: Secondary | ICD-10-CM | POA: Diagnosis not present

## 2018-08-03 DIAGNOSIS — Z23 Encounter for immunization: Secondary | ICD-10-CM | POA: Diagnosis not present

## 2018-08-03 DIAGNOSIS — Z0001 Encounter for general adult medical examination with abnormal findings: Secondary | ICD-10-CM | POA: Diagnosis not present

## 2018-08-07 DIAGNOSIS — H35041 Retinal micro-aneurysms, unspecified, right eye: Secondary | ICD-10-CM | POA: Diagnosis not present

## 2018-08-07 DIAGNOSIS — H34821 Venous engorgement, right eye: Secondary | ICD-10-CM | POA: Diagnosis not present

## 2018-08-07 DIAGNOSIS — H348112 Central retinal vein occlusion, right eye, stable: Secondary | ICD-10-CM | POA: Diagnosis not present

## 2018-08-07 DIAGNOSIS — H3561 Retinal hemorrhage, right eye: Secondary | ICD-10-CM | POA: Diagnosis not present

## 2018-08-08 DIAGNOSIS — R262 Difficulty in walking, not elsewhere classified: Secondary | ICD-10-CM | POA: Diagnosis not present

## 2018-08-08 DIAGNOSIS — M1711 Unilateral primary osteoarthritis, right knee: Secondary | ICD-10-CM | POA: Diagnosis not present

## 2018-08-08 DIAGNOSIS — M25561 Pain in right knee: Secondary | ICD-10-CM | POA: Diagnosis not present

## 2018-08-08 DIAGNOSIS — M6281 Muscle weakness (generalized): Secondary | ICD-10-CM | POA: Diagnosis not present

## 2018-08-08 DIAGNOSIS — M545 Low back pain: Secondary | ICD-10-CM | POA: Diagnosis not present

## 2018-08-10 DIAGNOSIS — M1711 Unilateral primary osteoarthritis, right knee: Secondary | ICD-10-CM | POA: Diagnosis not present

## 2018-08-10 DIAGNOSIS — R262 Difficulty in walking, not elsewhere classified: Secondary | ICD-10-CM | POA: Diagnosis not present

## 2018-08-10 DIAGNOSIS — M25561 Pain in right knee: Secondary | ICD-10-CM | POA: Diagnosis not present

## 2018-08-10 DIAGNOSIS — M545 Low back pain: Secondary | ICD-10-CM | POA: Diagnosis not present

## 2018-08-10 DIAGNOSIS — M6281 Muscle weakness (generalized): Secondary | ICD-10-CM | POA: Diagnosis not present

## 2018-08-14 DIAGNOSIS — M1711 Unilateral primary osteoarthritis, right knee: Secondary | ICD-10-CM | POA: Diagnosis not present

## 2018-08-14 DIAGNOSIS — M6281 Muscle weakness (generalized): Secondary | ICD-10-CM | POA: Diagnosis not present

## 2018-08-14 DIAGNOSIS — M25561 Pain in right knee: Secondary | ICD-10-CM | POA: Diagnosis not present

## 2018-08-14 DIAGNOSIS — M545 Low back pain: Secondary | ICD-10-CM | POA: Diagnosis not present

## 2018-08-15 DIAGNOSIS — Z961 Presence of intraocular lens: Secondary | ICD-10-CM | POA: Diagnosis not present

## 2018-08-15 DIAGNOSIS — H04123 Dry eye syndrome of bilateral lacrimal glands: Secondary | ICD-10-CM | POA: Diagnosis not present

## 2018-08-15 DIAGNOSIS — H02886 Meibomian gland dysfunction of left eye, unspecified eyelid: Secondary | ICD-10-CM | POA: Diagnosis not present

## 2018-08-15 DIAGNOSIS — H02883 Meibomian gland dysfunction of right eye, unspecified eyelid: Secondary | ICD-10-CM | POA: Diagnosis not present

## 2018-08-15 DIAGNOSIS — H26491 Other secondary cataract, right eye: Secondary | ICD-10-CM | POA: Diagnosis not present

## 2018-08-15 DIAGNOSIS — H0011 Chalazion right upper eyelid: Secondary | ICD-10-CM | POA: Diagnosis not present

## 2018-08-16 DIAGNOSIS — M545 Low back pain: Secondary | ICD-10-CM | POA: Diagnosis not present

## 2018-08-16 DIAGNOSIS — M6281 Muscle weakness (generalized): Secondary | ICD-10-CM | POA: Diagnosis not present

## 2018-08-16 DIAGNOSIS — M25561 Pain in right knee: Secondary | ICD-10-CM | POA: Diagnosis not present

## 2018-08-16 DIAGNOSIS — M1711 Unilateral primary osteoarthritis, right knee: Secondary | ICD-10-CM | POA: Diagnosis not present

## 2018-08-22 DIAGNOSIS — M25561 Pain in right knee: Secondary | ICD-10-CM | POA: Diagnosis not present

## 2018-08-22 DIAGNOSIS — M545 Low back pain: Secondary | ICD-10-CM | POA: Diagnosis not present

## 2018-08-22 DIAGNOSIS — M6281 Muscle weakness (generalized): Secondary | ICD-10-CM | POA: Diagnosis not present

## 2018-08-22 DIAGNOSIS — M1711 Unilateral primary osteoarthritis, right knee: Secondary | ICD-10-CM | POA: Diagnosis not present

## 2018-10-03 DIAGNOSIS — J329 Chronic sinusitis, unspecified: Secondary | ICD-10-CM | POA: Diagnosis not present

## 2018-10-03 DIAGNOSIS — Z6827 Body mass index (BMI) 27.0-27.9, adult: Secondary | ICD-10-CM | POA: Diagnosis not present

## 2018-10-05 DIAGNOSIS — H02886 Meibomian gland dysfunction of left eye, unspecified eyelid: Secondary | ICD-10-CM | POA: Diagnosis not present

## 2018-10-05 DIAGNOSIS — H26491 Other secondary cataract, right eye: Secondary | ICD-10-CM | POA: Diagnosis not present

## 2018-10-05 DIAGNOSIS — H02883 Meibomian gland dysfunction of right eye, unspecified eyelid: Secondary | ICD-10-CM | POA: Diagnosis not present

## 2018-10-05 DIAGNOSIS — Z961 Presence of intraocular lens: Secondary | ICD-10-CM | POA: Diagnosis not present

## 2018-10-05 DIAGNOSIS — H0011 Chalazion right upper eyelid: Secondary | ICD-10-CM | POA: Diagnosis not present

## 2018-10-05 DIAGNOSIS — H04123 Dry eye syndrome of bilateral lacrimal glands: Secondary | ICD-10-CM | POA: Diagnosis not present

## 2018-11-09 DIAGNOSIS — Z1231 Encounter for screening mammogram for malignant neoplasm of breast: Secondary | ICD-10-CM | POA: Diagnosis not present

## 2018-11-15 DIAGNOSIS — J328 Other chronic sinusitis: Secondary | ICD-10-CM | POA: Diagnosis not present

## 2018-11-15 DIAGNOSIS — Z7952 Long term (current) use of systemic steroids: Secondary | ICD-10-CM | POA: Diagnosis not present

## 2018-11-15 DIAGNOSIS — R0982 Postnasal drip: Secondary | ICD-10-CM | POA: Diagnosis not present

## 2018-11-15 DIAGNOSIS — J329 Chronic sinusitis, unspecified: Secondary | ICD-10-CM | POA: Diagnosis not present

## 2018-11-15 DIAGNOSIS — K219 Gastro-esophageal reflux disease without esophagitis: Secondary | ICD-10-CM | POA: Diagnosis not present

## 2018-11-15 DIAGNOSIS — J310 Chronic rhinitis: Secondary | ICD-10-CM | POA: Diagnosis not present

## 2018-11-26 DIAGNOSIS — Z01419 Encounter for gynecological examination (general) (routine) without abnormal findings: Secondary | ICD-10-CM | POA: Diagnosis not present

## 2018-11-28 DIAGNOSIS — R928 Other abnormal and inconclusive findings on diagnostic imaging of breast: Secondary | ICD-10-CM | POA: Diagnosis not present

## 2018-11-28 DIAGNOSIS — N6001 Solitary cyst of right breast: Secondary | ICD-10-CM | POA: Diagnosis not present

## 2018-12-10 DIAGNOSIS — H04123 Dry eye syndrome of bilateral lacrimal glands: Secondary | ICD-10-CM | POA: Diagnosis not present

## 2018-12-10 DIAGNOSIS — Z961 Presence of intraocular lens: Secondary | ICD-10-CM | POA: Diagnosis not present

## 2018-12-10 DIAGNOSIS — H02886 Meibomian gland dysfunction of left eye, unspecified eyelid: Secondary | ICD-10-CM | POA: Diagnosis not present

## 2018-12-10 DIAGNOSIS — H26491 Other secondary cataract, right eye: Secondary | ICD-10-CM | POA: Diagnosis not present

## 2018-12-10 DIAGNOSIS — Z8669 Personal history of other diseases of the nervous system and sense organs: Secondary | ICD-10-CM | POA: Diagnosis not present

## 2018-12-10 DIAGNOSIS — H02883 Meibomian gland dysfunction of right eye, unspecified eyelid: Secondary | ICD-10-CM | POA: Diagnosis not present

## 2019-01-03 DIAGNOSIS — Z6827 Body mass index (BMI) 27.0-27.9, adult: Secondary | ICD-10-CM | POA: Diagnosis not present

## 2019-01-03 DIAGNOSIS — Z23 Encounter for immunization: Secondary | ICD-10-CM | POA: Diagnosis not present

## 2019-01-03 DIAGNOSIS — M797 Fibromyalgia: Secondary | ICD-10-CM | POA: Diagnosis not present

## 2019-01-03 DIAGNOSIS — Z9189 Other specified personal risk factors, not elsewhere classified: Secondary | ICD-10-CM | POA: Diagnosis not present

## 2019-01-03 DIAGNOSIS — E039 Hypothyroidism, unspecified: Secondary | ICD-10-CM | POA: Diagnosis not present

## 2019-01-03 DIAGNOSIS — K219 Gastro-esophageal reflux disease without esophagitis: Secondary | ICD-10-CM | POA: Diagnosis not present

## 2019-01-03 DIAGNOSIS — Z1212 Encounter for screening for malignant neoplasm of rectum: Secondary | ICD-10-CM | POA: Diagnosis not present

## 2019-01-03 DIAGNOSIS — E782 Mixed hyperlipidemia: Secondary | ICD-10-CM | POA: Diagnosis not present

## 2019-01-03 DIAGNOSIS — M5030 Other cervical disc degeneration, unspecified cervical region: Secondary | ICD-10-CM | POA: Diagnosis not present

## 2019-01-03 DIAGNOSIS — G43909 Migraine, unspecified, not intractable, without status migrainosus: Secondary | ICD-10-CM | POA: Diagnosis not present

## 2019-01-03 DIAGNOSIS — J301 Allergic rhinitis due to pollen: Secondary | ICD-10-CM | POA: Diagnosis not present

## 2019-01-03 DIAGNOSIS — M3130 Wegener's granulomatosis without renal involvement: Secondary | ICD-10-CM | POA: Diagnosis not present

## 2019-03-04 DIAGNOSIS — Z7982 Long term (current) use of aspirin: Secondary | ICD-10-CM | POA: Diagnosis not present

## 2019-03-04 DIAGNOSIS — R1084 Generalized abdominal pain: Secondary | ICD-10-CM | POA: Diagnosis not present

## 2019-03-04 DIAGNOSIS — R1110 Vomiting, unspecified: Secondary | ICD-10-CM | POA: Diagnosis not present

## 2019-03-04 DIAGNOSIS — I700 Atherosclerosis of aorta: Secondary | ICD-10-CM | POA: Diagnosis not present

## 2019-03-04 DIAGNOSIS — Z880 Allergy status to penicillin: Secondary | ICD-10-CM | POA: Diagnosis not present

## 2019-03-04 DIAGNOSIS — K5730 Diverticulosis of large intestine without perforation or abscess without bleeding: Secondary | ICD-10-CM | POA: Diagnosis not present

## 2019-03-04 DIAGNOSIS — E039 Hypothyroidism, unspecified: Secondary | ICD-10-CM | POA: Diagnosis not present

## 2019-03-04 DIAGNOSIS — N200 Calculus of kidney: Secondary | ICD-10-CM | POA: Diagnosis not present

## 2019-03-04 DIAGNOSIS — Z79899 Other long term (current) drug therapy: Secondary | ICD-10-CM | POA: Diagnosis not present

## 2019-03-24 DIAGNOSIS — R112 Nausea with vomiting, unspecified: Secondary | ICD-10-CM | POA: Diagnosis not present

## 2019-03-24 DIAGNOSIS — E039 Hypothyroidism, unspecified: Secondary | ICD-10-CM | POA: Diagnosis not present

## 2019-03-24 DIAGNOSIS — N200 Calculus of kidney: Secondary | ICD-10-CM | POA: Diagnosis not present

## 2019-03-24 DIAGNOSIS — Z9049 Acquired absence of other specified parts of digestive tract: Secondary | ICD-10-CM | POA: Diagnosis not present

## 2019-03-24 DIAGNOSIS — Z79899 Other long term (current) drug therapy: Secondary | ICD-10-CM | POA: Diagnosis not present

## 2019-03-24 DIAGNOSIS — R1013 Epigastric pain: Secondary | ICD-10-CM | POA: Diagnosis not present

## 2019-03-25 DIAGNOSIS — R109 Unspecified abdominal pain: Secondary | ICD-10-CM | POA: Diagnosis not present

## 2019-03-26 DIAGNOSIS — R109 Unspecified abdominal pain: Secondary | ICD-10-CM | POA: Diagnosis not present

## 2019-04-09 DIAGNOSIS — H43812 Vitreous degeneration, left eye: Secondary | ICD-10-CM | POA: Diagnosis not present

## 2019-04-09 DIAGNOSIS — H348112 Central retinal vein occlusion, right eye, stable: Secondary | ICD-10-CM | POA: Diagnosis not present

## 2019-04-09 DIAGNOSIS — H35041 Retinal micro-aneurysms, unspecified, right eye: Secondary | ICD-10-CM | POA: Diagnosis not present

## 2019-04-09 DIAGNOSIS — H43811 Vitreous degeneration, right eye: Secondary | ICD-10-CM | POA: Diagnosis not present

## 2019-04-09 DIAGNOSIS — H3561 Retinal hemorrhage, right eye: Secondary | ICD-10-CM | POA: Diagnosis not present

## 2019-04-09 DIAGNOSIS — H34821 Venous engorgement, right eye: Secondary | ICD-10-CM | POA: Diagnosis not present

## 2019-04-22 DIAGNOSIS — Z6826 Body mass index (BMI) 26.0-26.9, adult: Secondary | ICD-10-CM | POA: Diagnosis not present

## 2019-04-22 DIAGNOSIS — R109 Unspecified abdominal pain: Secondary | ICD-10-CM | POA: Diagnosis not present

## 2019-05-02 DIAGNOSIS — Z0182 Encounter for allergy testing: Secondary | ICD-10-CM | POA: Diagnosis not present

## 2019-05-02 DIAGNOSIS — R110 Nausea: Secondary | ICD-10-CM | POA: Diagnosis not present

## 2019-05-02 DIAGNOSIS — R109 Unspecified abdominal pain: Secondary | ICD-10-CM | POA: Diagnosis not present

## 2019-05-27 ENCOUNTER — Encounter: Payer: Self-pay | Admitting: Internal Medicine

## 2019-06-26 ENCOUNTER — Encounter: Payer: Self-pay | Admitting: Nurse Practitioner

## 2019-06-26 ENCOUNTER — Ambulatory Visit (INDEPENDENT_AMBULATORY_CARE_PROVIDER_SITE_OTHER): Payer: Medicare Other | Admitting: Nurse Practitioner

## 2019-06-26 ENCOUNTER — Other Ambulatory Visit: Payer: Self-pay

## 2019-06-26 DIAGNOSIS — K219 Gastro-esophageal reflux disease without esophagitis: Secondary | ICD-10-CM | POA: Diagnosis not present

## 2019-06-26 DIAGNOSIS — R112 Nausea with vomiting, unspecified: Secondary | ICD-10-CM | POA: Insufficient documentation

## 2019-06-26 NOTE — Assessment & Plan Note (Signed)
Noted chronic history of GERD.  She was having nausea and vomiting as per below.  She was placed on Prevacid twice daily and this has reduced her reflux symptoms significantly and also reduced her nausea and vomiting.  Recommend she continue Prevacid twice daily.  Avoid NSAIDs, GERD dietary information will be provided to her.  Can use Tums as needed for breakthrough GERD.  Return for follow-up in 3 months.

## 2019-06-26 NOTE — Progress Notes (Signed)
Primary Care Physician:  Caryl Bis, MD Primary Gastroenterologist:  Dr. Gala Romney  Chief Complaint  Patient presents with  . Nausea    x March. Better now. has episodes few times a week. No vomiting in 1 month  . Abdominal Pain    started in march. Upper abd, comes/goes    HPI:   Brittany Shields is a 68 y.o. female who presents on referral from primary care for nausea.  Reviewed information provided with the referral including office visit dated 04/22/2019 for nausea started 2 months prior.  This continues despite reflux pills.  A tomato sandwich made her symptoms worse.  No vomiting, diarrhea, fever, chills.  Eating okay most of the time.  Indicated labs including celiac panel.  New labs: 04/22/2019 which showed essentially normal CMP, essentially normal CBC, normal lipase, normal celiac panel.  No history of endoscopy or colonoscopy in our system.  Today she states she's doing better. Was having frequent N/V since March. She was placed on acid suppression therapy. Has gotten better starting in May. Some persistent nausea, but less frequent and responds to Phenergan. Hasn't vomited in a couple months. Currently on Aciphex bid. She was previously having reflux "for years." Still has GERD, but not as bad/often. She takes Excedrin Migraine for headaches; Tylenol doesn't help for her headaches. Tries to eat smaller meals which has helped. Was having dark stools when nauseated and had this checked which was negative for blood. Denies hematochezia, fever, chills, unintentional weight loss. Denies URI or flu-like symptoms. Denies loss of sense of taste or smell. Denies chest pain, dyspnea, dizziness, lightheadedness, syncope, near syncope. Denies any other upper or lower GI symptoms.   Past Medical History:  Diagnosis Date  . Arthritis   . Chronic sinusitis   . Generalized headaches   . GERD (gastroesophageal reflux disease)   . Hypothyroidism     Past Surgical History:  Procedure  Laterality Date  . ANTERIOR CERVICAL DECOMP/DISCECTOMY FUSION N/A 08/28/2017   Procedure: ANTERIOR CERVICAL DECOMPRESSION AND FUSION CERVICAL FIVE-SIX,CERVICAL SIX-SEVEN;  Surgeon: Earnie Larsson, MD;  Location: [facility];  Service: Neurosurgery;  Laterality: N/A;  anterior  . CARPAL TUNNEL RELEASE Right 2001  . CATARACT EXTRACTION W/PHACO Right 08/08/2017   Procedure: CATARACT EXTRACTION PHACO AND INTRAOCULAR LENS PLACEMENT (IOC);  Surgeon: Rutherford Guys, MD;  Location: [facility];  Service: Ophthalmology;  Laterality: Right;  CDE: 4.18  . CATARACT EXTRACTION W/PHACO Left 11/10/2017   Procedure: CATARACT EXTRACTION PHACO AND INTRAOCULAR LENS PLACEMENT (IOC);  Surgeon: Rutherford Guys, MD;  Location: [facility];  Service: Ophthalmology;  Laterality: Left;  CDE: 3.27  . CHOLECYSTECTOMY    . COLONOSCOPY    . CYST EXCISION     right eye  . EYE SURGERY    . HAMMER TOE SURGERY Bilateral   . NASAL/SINUS ENDOSCOPY    . TUBAL LIGATION    . tubal reversal  1991    Current Outpatient Medications  Medication Sig Dispense Refill  . Artificial Tear Ointment (EQ RESTORE PM OP) Place 1 drop into both eyes at bedtime as needed (for dry eyes).     Marland Kitchen aspirin-acetaminophen-caffeine (EXCEDRIN MIGRAINE) 250-250-65 MG tablet Take 1 tablet by mouth every 6 (six) hours as needed for headache or migraine.    . Calcium Carb-Cholecalciferol (CALCIUM 600+D3 PO) Take 1 tablet by mouth 2 (two) times daily.    Marland Kitchen docusate sodium (COLACE) 100 MG capsule Take 200 mg by mouth at bedtime.    . DUAVEE 0.45-20 MG TABS  Take 1 tablet by mouth daily.    . Hypertonic Nasal Wash (SINUS RINSE NA) Place 1 each into the nose 2 (two) times daily.    Marland Kitchen levETIRAcetam (KEPPRA) 500 MG tablet Take 500 mg by mouth 2 (two) times daily.    Marland Kitchen levothyroxine (SYNTHROID, LEVOTHROID) 50 MCG tablet Take 50 mcg by mouth daily before breakfast.    . predniSONE (DELTASONE) 5 MG tablet Take 5 mg by mouth daily with breakfast.    . Probiotic Product (PROBIOTIC DAILY)  CAPS Take 1 capsule by mouth daily.    . promethazine (PHENERGAN) 12.5 MG tablet 1/2 tablet up to 4 times a day as needed    . RABEprazole (ACIPHEX) 20 MG tablet Take 20 mg by mouth 2 (two) times daily.      No current facility-administered medications for this visit.     Allergies as of 06/26/2019 - Review Complete 06/26/2019  Allergen Reaction Noted  . Zithromax [azithromycin] Rash 08/02/2017    Family History  Problem Relation Age of Onset  . Colon cancer Neg Hx     Social History   Socioeconomic History  . Marital status: Married    Spouse name: Not on file  . Number of children: Not on file  . Years of education: Not on file  . Highest education level: Not on file  Occupational History  . Not on file  Social Needs  . Financial resource strain: Not on file  . Food insecurity    Worry: Not on file    Inability: Not on file  . Transportation needs    Medical: Not on file    Non-medical: Not on file  Tobacco Use  . Smoking status: Never Smoker  . Smokeless tobacco: Never Used  Substance and Sexual Activity  . Alcohol use: No  . Drug use: No  . Sexual activity: Never  Lifestyle  . Physical activity    Days per week: Not on file    Minutes per session: Not on file  . Stress: Not on file  Relationships  . Social Herbalist on phone: Not on file    Gets together: Not on file    Attends religious service: Not on file    Active member of club or organization: Not on file    Attends meetings of clubs or organizations: Not on file    Relationship status: Not on file  . Intimate partner violence    Fear of current or ex partner: Not on file    Emotionally abused: Not on file    Physically abused: Not on file    Forced sexual activity: Not on file  Other Topics Concern  . Not on file  Social History Narrative  . Not on file    Review of Systems: General: Negative for anorexia, weight loss, fever, chills, fatigue, weakness. ENT: Negative for  hoarseness, difficulty swallowing. CV: Negative for chest pain, angina, palpitations, peripheral edema.  Respiratory: Negative for dyspnea at rest, cough, sputum, wheezing.  GI: See history of present illness. MS: Negative for joint pain, low back pain.  Derm: Negative for rash or itching.  Endo: Negative for unusual weight change.  Heme: Negative for bruising or bleeding.    Physical Exam: BP (!) 139/97   Pulse 100   Temp (!) 97 F (36.1 C) (Oral)   Ht 5\' 3"  (1.6 m)   Wt 156 lb 9.6 oz (71 kg)   BMI 27.74 kg/m  General:   Alert and  oriented. Pleasant and cooperative. Well-nourished and well-developed.  Head:  Normocephalic and atraumatic. Eyes:  Without icterus, sclera clear and conjunctiva pink.  Ears:  Normal auditory acuity. Cardiovascular:  S1, S2 present without murmurs appreciated. Extremities without clubbing or edema. Respiratory:  Clear to auscultation bilaterally. No wheezes, rales, or rhonchi. No distress.  Gastrointestinal:  +BS, soft, non-tender and non-distended. No HSM noted. No guarding or rebound. No masses appreciated.  Rectal:  Deferred  Musculoskalatal:  Symmetrical without gross deformities. Neurologic:  Alert and oriented x4;  grossly normal neurologically. Psych:  Alert and cooperative. Normal mood and affect. Heme/Lymph/Immune: No excessive bruising noted.    06/26/2019 8:22 AM   Disclaimer: This note was dictated with voice recognition software. Similar sounding words can inadvertently be transcribed and may not be corrected upon review.

## 2019-06-26 NOTE — Assessment & Plan Note (Addendum)
The patient was previously having significant and frequent nausea and vomiting.  Phenergan works well for her nausea.  She has had a significant improvement since starting Prevacid twice daily.  She will get mildly nauseated about 1-2 times a week.  Has not had any vomiting in over 2 months.  I feel one possible aggravating factor related to her GERD, nausea, vomiting is dependence on Excedrin Migraine as needed for headaches.  She is Tylenol which has not helped.  She is on another headache medicine which is not helped.  I recommend she discuss with primary care possible other options that do not involve NSAIDs.  In general, avoid NSAIDs and proceed with lifestyle changes for GERD.  She can use Phenergan half tablet up to 4 times a day as needed, as she has been.  Follow-up in 3 months.  No indication for endoscopic evaluation at this time.

## 2019-06-26 NOTE — Patient Instructions (Addendum)
Your health issues we discussed today were:   GERD (reflux/heartburn): 1. Avoid NSAIDs (ibuprofen, Motrin, Advil, Aleve, naproxen, Naprosyn, aspirin, BC powders, Goody powders). 2. Further information below is provided to help guide your dietary selections to prevent worsening GERD 3. Continue to take Prevacid twice a day 4. You can use Tums as needed for an episode of "breakthrough" reflux despite taking Prevacid 5. Notify us of any worsening or severe symptoms  Nausea with vomiting: 1. I feel your nausea symptoms are likely because of reflux. 2. I am glad your symptoms have improved recently 3. You can continue to use Phenergan as needed for nausea 4. Notify us of any worsening or severe symptoms  Overall I recommend:  1. Continue your current medications 2. Follow-up in 3 months 3. Let us know if you have any questions or concerns.   Because of recent events of COVID-19 ("Coronavirus"), follow CDC recommendations:  Wash your hand frequently Avoid touching your face Stay away from people who are sick If you have symptoms such as fever, cough, shortness of breath then call your healthcare provider for further guidance If you are sick, STAY AT HOME unless otherwise directed by your healthcare provider. Follow directions from state and national officials regarding staying safe   At [facility] we value your feedback. You may receive a survey about your visit today. Please share your experience as we strive to create trusting relationships with our patients to provide genuine, compassionate, quality care.  We appreciate your understanding and patience as we review any laboratory studies, imaging, and other diagnostic tests that are ordered as we care for you. Our office policy is 5 business days for review of these results, and any emergent or urgent results are addressed in a timely manner for your best interest. If you do not hear from our office in 1 week, please  contact us.   We also encourage the use of MyChart, which contains your medical information for your review as well. If you are not enrolled in this feature, an access code is on this after visit summary for your convenience. Thank you for allowing [place] to be involved in your care.  It was great to see you today!  I hope you have a great summer!!       Food Choices for Gastroesophageal Reflux Disease, Adult When you have gastroesophageal reflux disease (GERD), the foods you eat and your eating habits are very important. Choosing the right foods can help ease the discomfort of GERD. Consider working with a diet and nutrition specialist (dietitian) to help you make healthy food choices. What general guidelines should I follow?  Eating plan  Choose healthy foods low in fat, such as fruits, vegetables, whole grains, low-fat dairy products, and lean meat, fish, and poultry.  Eat frequent, small meals instead of three large meals each day. Eat your meals slowly, in a relaxed setting. Avoid bending over or lying down until 2-3 hours after eating.  Limit high-fat foods such as fatty meats or fried foods.  Limit your intake of oils, butter, and shortening to less than 8 teaspoons each day.  Avoid the following: ? Foods that cause symptoms. These may be different for different people. Keep a food diary to keep track of foods that cause symptoms. ? Alcohol. ? Drinking large amounts of liquid with meals. ? Eating meals during the 2-3 hours before bed.  Cook foods using methods other than frying. This may include baking, grilling, or broiling. Lifestyle  Maintain  a healthy weight. Ask your health care provider what weight is healthy for you. If you need to lose weight, work with your health care provider to do so safely.  Exercise for at least 30 minutes on 5 or more days each week, or as told by your health care provider.  Avoid wearing clothes that fit tightly around your waist and  chest.  Do not use any products that contain nicotine or tobacco, such as cigarettes and e-cigarettes. If you need help quitting, ask your health care provider.  Sleep with the head of your bed raised. Use a wedge under the mattress or blocks under the bed frame to raise the head of the bed. What foods are not recommended? The items listed may not be a complete list. Talk with your dietitian about what dietary choices are best for you. Grains Pastries or quick breads with added fat. [place] toast. Vegetables Deep fried vegetables. [place] fries. Any vegetables prepared with added fat. Any vegetables that cause symptoms. For some people this may include tomatoes and tomato products, chili peppers, onions and garlic, and horseradish. Fruits Any fruits prepared with added fat. Any fruits that cause symptoms. For some people this may include citrus fruits, such as oranges, grapefruit, pineapple, and lemons. Meats and other protein foods High-fat meats, such as fatty beef or pork, hot dogs, ribs, ham, sausage, salami and bacon. Fried meat or protein, including fried fish and fried chicken. Nuts and nut butters. Dairy Whole milk and chocolate milk. Sour cream. Cream. Ice cream. Cream cheese. Milk shakes. Beverages Coffee and tea, with or without caffeine. Carbonated beverages. Sodas. Energy drinks. Fruit juice made with acidic fruits (such as orange or grapefruit). Tomato juice. Alcoholic drinks. Fats and oils Butter. Margarine. Shortening. Ghee. Sweets and desserts Chocolate and cocoa. Donuts. Seasoning and other foods Pepper. Peppermint and spearmint. Any condiments, herbs, or seasonings that cause symptoms. For some people, this may include curry, hot sauce, or vinegar-based salad dressings. Summary  When you have gastroesophageal reflux disease (GERD), food and lifestyle choices are very important to help ease the discomfort of GERD.  Eat frequent, small meals instead of three large meals  each day. Eat your meals slowly, in a relaxed setting. Avoid bending over or lying down until 2-3 hours after eating.  Limit high-fat foods such as fatty meat or fried foods. This information is not intended to replace advice given to you by your health care provider. Make sure you discuss any questions you have with your health care provider. Document Released: 10/31/2005 Document Revised: 02/21/2019 Document Reviewed: 11/01/2016 Elsevier Patient Education  2020 [employer].

## 2019-08-01 DIAGNOSIS — K210 Gastro-esophageal reflux disease with esophagitis: Secondary | ICD-10-CM | POA: Diagnosis not present

## 2019-08-01 DIAGNOSIS — E039 Hypothyroidism, unspecified: Secondary | ICD-10-CM | POA: Diagnosis not present

## 2019-08-01 DIAGNOSIS — M797 Fibromyalgia: Secondary | ICD-10-CM | POA: Diagnosis not present

## 2019-08-01 DIAGNOSIS — E782 Mixed hyperlipidemia: Secondary | ICD-10-CM | POA: Diagnosis not present

## 2019-08-01 DIAGNOSIS — N183 Chronic kidney disease, stage 3 (moderate): Secondary | ICD-10-CM | POA: Diagnosis not present

## 2019-08-07 DIAGNOSIS — Z23 Encounter for immunization: Secondary | ICD-10-CM | POA: Diagnosis not present

## 2019-08-07 DIAGNOSIS — E039 Hypothyroidism, unspecified: Secondary | ICD-10-CM | POA: Diagnosis not present

## 2019-08-07 DIAGNOSIS — E782 Mixed hyperlipidemia: Secondary | ICD-10-CM | POA: Diagnosis not present

## 2019-08-07 DIAGNOSIS — M797 Fibromyalgia: Secondary | ICD-10-CM | POA: Diagnosis not present

## 2019-08-07 DIAGNOSIS — Z6826 Body mass index (BMI) 26.0-26.9, adult: Secondary | ICD-10-CM | POA: Diagnosis not present

## 2019-08-07 DIAGNOSIS — M3130 Wegener's granulomatosis without renal involvement: Secondary | ICD-10-CM | POA: Diagnosis not present

## 2019-08-07 DIAGNOSIS — Z0001 Encounter for general adult medical examination with abnormal findings: Secondary | ICD-10-CM | POA: Diagnosis not present

## 2019-08-07 DIAGNOSIS — G43909 Migraine, unspecified, not intractable, without status migrainosus: Secondary | ICD-10-CM | POA: Diagnosis not present

## 2019-08-20 DIAGNOSIS — K219 Gastro-esophageal reflux disease without esophagitis: Secondary | ICD-10-CM | POA: Diagnosis not present

## 2019-08-20 DIAGNOSIS — R0982 Postnasal drip: Secondary | ICD-10-CM | POA: Diagnosis not present

## 2019-08-20 DIAGNOSIS — J328 Other chronic sinusitis: Secondary | ICD-10-CM | POA: Diagnosis not present

## 2019-08-20 DIAGNOSIS — J310 Chronic rhinitis: Secondary | ICD-10-CM | POA: Diagnosis not present

## 2019-08-20 DIAGNOSIS — J329 Chronic sinusitis, unspecified: Secondary | ICD-10-CM | POA: Diagnosis not present

## 2019-08-21 DIAGNOSIS — H02889 Meibomian gland dysfunction of unspecified eye, unspecified eyelid: Secondary | ICD-10-CM | POA: Diagnosis not present

## 2019-08-21 DIAGNOSIS — H0011 Chalazion right upper eyelid: Secondary | ICD-10-CM | POA: Diagnosis not present

## 2019-08-21 DIAGNOSIS — H04123 Dry eye syndrome of bilateral lacrimal glands: Secondary | ICD-10-CM | POA: Diagnosis not present

## 2019-08-21 DIAGNOSIS — Z961 Presence of intraocular lens: Secondary | ICD-10-CM | POA: Diagnosis not present

## 2019-09-18 DIAGNOSIS — H02889 Meibomian gland dysfunction of unspecified eye, unspecified eyelid: Secondary | ICD-10-CM | POA: Diagnosis not present

## 2019-09-18 DIAGNOSIS — H26491 Other secondary cataract, right eye: Secondary | ICD-10-CM | POA: Diagnosis not present

## 2019-09-18 DIAGNOSIS — H0011 Chalazion right upper eyelid: Secondary | ICD-10-CM | POA: Diagnosis not present

## 2019-09-18 DIAGNOSIS — H04123 Dry eye syndrome of bilateral lacrimal glands: Secondary | ICD-10-CM | POA: Diagnosis not present

## 2019-09-18 DIAGNOSIS — Z961 Presence of intraocular lens: Secondary | ICD-10-CM | POA: Diagnosis not present

## 2019-09-26 ENCOUNTER — Ambulatory Visit: Payer: Medicare Other | Admitting: Nurse Practitioner

## 2019-10-03 DIAGNOSIS — M79645 Pain in left finger(s): Secondary | ICD-10-CM | POA: Diagnosis not present

## 2019-10-03 DIAGNOSIS — M720 Palmar fascial fibromatosis [Dupuytren]: Secondary | ICD-10-CM | POA: Diagnosis not present

## 2019-10-07 DIAGNOSIS — M7050 Other bursitis of knee, unspecified knee: Secondary | ICD-10-CM | POA: Diagnosis not present

## 2019-10-07 DIAGNOSIS — G43909 Migraine, unspecified, not intractable, without status migrainosus: Secondary | ICD-10-CM | POA: Diagnosis not present

## 2019-10-07 DIAGNOSIS — Z6825 Body mass index (BMI) 25.0-25.9, adult: Secondary | ICD-10-CM | POA: Diagnosis not present

## 2019-11-21 ENCOUNTER — Ambulatory Visit (INDEPENDENT_AMBULATORY_CARE_PROVIDER_SITE_OTHER): Payer: Medicare Other | Admitting: Nurse Practitioner

## 2019-11-21 ENCOUNTER — Encounter: Payer: Self-pay | Admitting: Nurse Practitioner

## 2019-11-21 ENCOUNTER — Other Ambulatory Visit: Payer: Self-pay

## 2019-11-21 VITALS — BP 145/86 | HR 103 | Temp 97.9°F | Ht 63.5 in | Wt 152.6 lb

## 2019-11-21 DIAGNOSIS — K219 Gastro-esophageal reflux disease without esophagitis: Secondary | ICD-10-CM

## 2019-11-21 DIAGNOSIS — R112 Nausea with vomiting, unspecified: Secondary | ICD-10-CM | POA: Diagnosis not present

## 2019-11-21 MED ORDER — ONDANSETRON HCL 4 MG PO TABS: 4.0000 mg | ORAL_TABLET | Freq: Three times a day (TID) | ORAL | 1 refills | Status: DC | PRN

## 2019-11-21 NOTE — Assessment & Plan Note (Signed)
Noted GERD symptoms as per below to include nausea and less frequently vomiting.  We are adjusting her current medications to see if we can get some improvement.  In the meantime I will send in Zofran to her pharmacy.  I will avoid Phenergan due to her age and beers criteria with risk for elderly population with falls and sleepiness/dizziness.  Return for follow-up in 4 months.

## 2019-11-21 NOTE — Assessment & Plan Note (Signed)
Worsening GERD despite intiial improvement on Prevacid twice daily (although she admits she takes "pills in the morning because he was confused about the instructions.)  At this point she is trying to fill Prevacid and Protonix "high-dose options.  I will trial her on Dexilant 60 mg daily with samples for last 1 to 2 weeks and requested progress report.  Given the persistent symptoms despite appropriate medications we will set her up for an upper endoscopy to further evaluate.  I fear that she may have some gastritis, erosions, developing ulcers due to persistent Excedrin use due to migraine headaches.  In the past I have asked her to reduce her NSAID use as much as possible.  Primary care appears to be trying other medication options for headache to try to help this.  There is also the possibility of H. pylori and she may need gastric biopsy to check.  Follow-up in 4 months.  Proceed with EGD with Dr. Gala Romney in near future: the risks, benefits, and alternatives have been discussed with the patient in detail. The patient states understanding and desires to proceed.  The patient is not on any anticoagulants, anxiolytics, chronic pain medications, antidepressants, antidiabetics, or iron supplements.  Denies alcohol and drug use.  Conscious sedation should be adequate for her procedure.

## 2019-11-21 NOTE — Patient Instructions (Signed)
Your health issues we discussed today were:   GERD (reflux/heartburn) with nausea/vomiting: 1. I have sent a nausea medicine to your pharmacy called Zofran 4 mg.  You can take this every 8 hours as needed 2. Let us know if your nausea gets worse 3. I am giving you samples of Dexilant 60 mg.  This is a 24-hour release medication 4. Take Dexilant once a day, on an empty stomach before your first meal the day 5. When you start Dexilant, stop taking Prevacid (rabeprazole) 6. We will schedule your upper endoscopy for you 7. Further recommendations will follow 8. Call us if you have any worsening or severe symptoms 9. Follow-up with your primary care provider about other possible treatment options for your headaches to try to reduce the amount of Excedrin you take  Overall I recommend:  1. Continue your other current medications 2. Return for follow-up in 4 months 3. Call us if you have any questions or concerns   Because of recent events of COVID-19 ("Coronavirus"), follow CDC recommendations:  Wash your hand frequently Avoid touching your face Stay away from people who are sick If you have symptoms such as fever, cough, shortness of breath then call your healthcare provider for further guidance If you are sick, STAY AT HOME unless otherwise directed by your healthcare provider. Follow directions from state and national officials regarding staying safe   At [facility] we value your feedback. You may receive a survey about your visit today. Please share your experience as we strive to create trusting relationships with our patients to provide genuine, compassionate, quality care.  We appreciate your understanding and patience as we review any laboratory studies, imaging, and other diagnostic tests that are ordered as we care for you. Our office policy is 5 business days for review of these results, and any emergent or urgent results are addressed in a timely manner for your  best interest. If you do not hear from our office in 1 week, please contact us.   We also encourage the use of MyChart, which contains your medical information for your review as well. If you are not enrolled in this feature, an access code is on this after visit summary for your convenience. Thank you for allowing [place] to be involved in your care.  It was great to see you today!  I hope you have a Happy New Year!!

## 2019-11-21 NOTE — Progress Notes (Signed)
Referring Provider: Caryl Bis, MD Primary Care Physician:  Caryl Bis, MD Primary GI:  Dr. Gala Romney  Chief Complaint  Patient presents with  . Gastroesophageal Reflux    c/o lots of vomiting, hot liquid comes back up in throat    HPI:   Brittany Shields is a 69 y.o. female who presents for follow-up on GERD.  The patient was last seen in our office 06/26/2019 for the same as well as non-intractable vomiting and nausea.  At that time she was referred by primary care for nausea despite reflux medications, worsened by a tomato sandwich.  At her last visit she was doing much better.  Noted frequent nausea and vomiting since March and started improving in May after PPI.  Some persistent nausea but much less frequent response to Phenergan, no vomiting in a couple months.  Currently on AcipHex and still has GERD but not as bad or often.  Takes Excedrin Migraine for headaches as Tylenol does not help.  Eating smaller meals helps as well.  Recommended she avoid NSAIDs, further information for patient education on GERD was given, continue Prevacid twice a day, Tums as needed, continue Phenergan as needed, follow-up in 3 months.  Today she states she's not doing as well. GERD has worsened. Typically has symptoms after eating, has been trying to eat smaller, more frequent meals. Eats a very small meal in the evening. Taking Prevacid but has been taking 2 pills in the morning before breakfast (rather than one pill bid). Some symptoms in the evening but not as bad in the morning. Worsening symptoms if she has hot flashes to include nausea; bending also causes worsening symptoms. Symptoms include mostly nausea and bitter reflux into her throat. Occasionally will have vomiting. Occasional very dark stools but has been taking Pepto. Denies abdominal pain, hematochezia, fever, chills, unintentional weight loss. Still on Excedrin Migraine and typically takes it about 2-3 times a week. PCP has started her on  Tofranil for migraines which helps some, but not a lot. Denies URI or flu-like symptoms. Denies loss of sense of taste or smell. Denies chest pain, dyspnea, dizziness, lightheadedness, syncope, near syncope. Denies any other upper or lower GI symptoms.  Previously has tried [employer]. No other PPIs identified. Has never had EGD before. Has sinus evaluation as possible etiology with "light run down my sinuses and they said it's not my sinuses."  Past Medical History:  Diagnosis Date  . Arthritis   . Chronic sinusitis   . Generalized headaches   . GERD (gastroesophageal reflux disease)   . Hypothyroidism     Past Surgical History:  Procedure Laterality Date  . ANTERIOR CERVICAL DECOMP/DISCECTOMY FUSION N/A 08/28/2017   Procedure: ANTERIOR CERVICAL DECOMPRESSION AND FUSION CERVICAL FIVE-SIX,CERVICAL SIX-SEVEN;  Surgeon: Earnie Larsson, MD;  Location: [facility];  Service: Neurosurgery;  Laterality: N/A;  anterior  . CARPAL TUNNEL RELEASE Right 2001  . CATARACT EXTRACTION W/PHACO Right 08/08/2017   Procedure: CATARACT EXTRACTION PHACO AND INTRAOCULAR LENS PLACEMENT (IOC);  Surgeon: Rutherford Guys, MD;  Location: [facility];  Service: Ophthalmology;  Laterality: Right;  CDE: 4.18  . CATARACT EXTRACTION W/PHACO Left 11/10/2017   Procedure: CATARACT EXTRACTION PHACO AND INTRAOCULAR LENS PLACEMENT (IOC);  Surgeon: Rutherford Guys, MD;  Location: [facility];  Service: Ophthalmology;  Laterality: Left;  CDE: 3.27  . CHOLECYSTECTOMY    . COLONOSCOPY    . CYST EXCISION     right eye  . EYE SURGERY    . HAMMER TOE SURGERY  Bilateral   . NASAL/SINUS ENDOSCOPY    . TUBAL LIGATION    . tubal reversal  1991    Current Outpatient Medications  Medication Sig Dispense Refill  . Artificial Tear Ointment (EQ RESTORE PM OP) Place 1 drop into both eyes at bedtime as needed (for dry eyes).     Marland Kitchen aspirin-acetaminophen-caffeine (EXCEDRIN MIGRAINE) 250-250-65 MG tablet Take 1 tablet by mouth every 6 (six) hours as needed for  headache or migraine.    . Calcium Carb-Cholecalciferol (CALCIUM 600+D3 PO) Take 1 tablet by mouth every morning.     . docusate sodium (COLACE) 100 MG capsule Take 200 mg by mouth at bedtime.    Marland Kitchen doxycycline (VIBRAMYCIN) 50 MG capsule Take 1 capsule by mouth 2 (two) times daily.    Marland Kitchen estazolam (PROSOM) 1 MG tablet Take 1 mg by mouth at bedtime.    . Hypertonic Nasal Wash (SINUS RINSE NA) Place 1 each into the nose 2 (two) times daily.    Marland Kitchen imipramine (TOFRANIL) 10 MG tablet Take 40 mg by mouth daily.    Marland Kitchen levETIRAcetam (KEPPRA) 500 MG tablet Take 500 mg by mouth 2 (two) times daily.    Marland Kitchen levothyroxine (SYNTHROID, LEVOTHROID) 50 MCG tablet Take 50 mcg by mouth daily before breakfast.    . predniSONE (DELTASONE) 5 MG tablet Take 5 mg by mouth daily with breakfast.    . Probiotic Product (PROBIOTIC DAILY) CAPS Take 1 capsule by mouth daily.    . progesterone (PROMETRIUM) 100 MG capsule Take 100 mg by mouth daily.    . promethazine (PHENERGAN) 12.5 MG tablet 1/2 tablet up to 4 times a day as needed    . RABEprazole (ACIPHEX) 20 MG tablet Take 40 mg by mouth every morning.      No current facility-administered medications for this visit.    Allergies as of 11/21/2019 - Review Complete 11/21/2019  Allergen Reaction Noted  . Zithromax [azithromycin] Rash 08/02/2017    Family History  Problem Relation Age of Onset  . Colon cancer Neg Hx     Social History   Socioeconomic History  . Marital status: Married    Spouse name: Not on file  . Number of children: Not on file  . Years of education: Not on file  . Highest education level: Not on file  Occupational History  . Not on file  Tobacco Use  . Smoking status: Never Smoker  . Smokeless tobacco: Never Used  Substance and Sexual Activity  . Alcohol use: No  . Drug use: No  . Sexual activity: Never  Other Topics Concern  . Not on file  Social History Narrative  . Not on file   Social Determinants of Health   Financial  Resource Strain:   . Difficulty of Paying Living Expenses: Not on file  Food Insecurity:   . Worried About Charity fundraiser in the Last Year: Not on file  . Ran Out of Food in the Last Year: Not on file  Transportation Needs:   . Lack of Transportation (Medical): Not on file  . Lack of Transportation (Non-Medical): Not on file  Physical Activity:   . Days of Exercise per Week: Not on file  . Minutes of Exercise per Session: Not on file  Stress:   . Feeling of Stress : Not on file  Social Connections:   . Frequency of Communication with Friends and Family: Not on file  . Frequency of Social Gatherings with Friends and Family: Not on  file  . Attends Religious Services: Not on file  . Active Member of Clubs or Organizations: Not on file  . Attends Archivist Meetings: Not on file  . Marital Status: Not on file    Review of Systems: General: Negative for anorexia, weight loss, fever, chills, fatigue, weakness. ENT: Negative for hoarseness, difficulty swallowing. CV: Negative for chest pain, angina, palpitations, peripheral edema.  Respiratory: Negative for dyspnea at rest, cough, sputum, wheezing.  GI: See history of present illness. Endo: Negative for unusual weight change.  Heme: Negative for bruising or bleeding. Allergy: Negative for rash or hives.   Physical Exam: BP (!) 145/86   Pulse (!) 103   Temp 97.9 F (36.6 C) (Oral)   Ht 5' 3.5" (1.613 m)   Wt 152 lb 9.6 oz (69.2 kg)   BMI 26.61 kg/m  General:   Alert and oriented. Pleasant and cooperative. Well-nourished and well-developed.  Eyes:  Without icterus, sclera clear and conjunctiva pink.  Ears:  Normal auditory acuity. Cardiovascular:  S1, S2 present without murmurs appreciated. Extremities without clubbing or edema. Respiratory:  Clear to auscultation bilaterally. No wheezes, rales, or rhonchi. No distress.  Gastrointestinal:  +BS, soft, and non-distended. Some mild epigastric TTP noted. No HSM noted.  No guarding or rebound. No masses appreciated.  Rectal:  Deferred  Musculoskalatal:  Symmetrical without gross deformities. Neurologic:  Alert and oriented x4;  grossly normal neurologically. Psych:  Alert and cooperative. Normal mood and affect. Heme/Lymph/Immune: No excessive bruising noted.    11/21/2019 11:14 AM   Disclaimer: This note was dictated with voice recognition software. Similar sounding words can inadvertently be transcribed and may not be corrected upon review.

## 2019-11-22 ENCOUNTER — Other Ambulatory Visit: Payer: Self-pay

## 2019-11-22 DIAGNOSIS — R112 Nausea with vomiting, unspecified: Secondary | ICD-10-CM

## 2019-11-22 DIAGNOSIS — K219 Gastro-esophageal reflux disease without esophagitis: Secondary | ICD-10-CM

## 2019-11-27 ENCOUNTER — Telehealth: Payer: Self-pay | Admitting: Internal Medicine

## 2019-11-27 DIAGNOSIS — R112 Nausea with vomiting, unspecified: Secondary | ICD-10-CM

## 2019-11-27 DIAGNOSIS — K219 Gastro-esophageal reflux disease without esophagitis: Secondary | ICD-10-CM

## 2019-11-27 MED ORDER — DEXILANT 60 MG PO CPDR: 60.0000 mg | DELAYED_RELEASE_CAPSULE | Freq: Every day | ORAL | 3 refills | Status: DC

## 2019-11-27 NOTE — Telephone Encounter (Signed)
Pt tried the samples given and she needs a prescription of Dexilant 60 mg sent to [facility] in [place]

## 2019-11-27 NOTE — Telephone Encounter (Signed)
Pt called with a progress report. Pt tried Dexilant 60 mg and it's working well for pt. Pt would like RX sent to her pharmacy.

## 2019-11-27 NOTE — Addendum Note (Signed)
Addended by: Gordy Levan, Zylon Creamer A on: 11/27/2019 04:13 PM   Modules accepted: Orders

## 2019-11-27 NOTE — Telephone Encounter (Signed)
Rx sent per patient request  

## 2019-11-27 NOTE — Telephone Encounter (Signed)
Left a detailed message for pt. Pt is aware that medication was sent to her pharmacy.

## 2019-11-28 ENCOUNTER — Telehealth: Payer: Self-pay | Admitting: Internal Medicine

## 2019-11-28 DIAGNOSIS — K219 Gastro-esophageal reflux disease without esophagitis: Secondary | ICD-10-CM

## 2019-11-28 DIAGNOSIS — R112 Nausea with vomiting, unspecified: Secondary | ICD-10-CM

## 2019-11-28 NOTE — Telephone Encounter (Signed)
Spoke with pt. The Pharmacy has sent a request over. Pantoprazole, Omeprazole and Famotidine requires no PA. Are any of these medications a PPI that pt can change to? Please advise.

## 2019-11-28 NOTE — Telephone Encounter (Signed)
Pt called to see if there was anything else cheaper she could try since the Dexilant was going to cost her $1000 to get or does she need a PA? Please advise and also can she get any dexilant samples? [phone] or [phone]

## 2019-11-29 MED ORDER — OMEPRAZOLE 40 MG PO CPDR: 40.0000 mg | DELAYED_RELEASE_CAPSULE | Freq: Two times a day (BID) | ORAL | 3 refills | Status: DC

## 2019-11-29 NOTE — Addendum Note (Signed)
Addended by: Gordy Levan, Kashaun Bebo A on: 11/29/2019 01:57 PM   Modules accepted: Orders

## 2019-11-29 NOTE — Telephone Encounter (Signed)
Pt called in to f/u.  Says she is going to run out of medicine on Sunday.  [phone] or [phone]

## 2019-11-29 NOTE — Telephone Encounter (Signed)
I have sent in Rx for omeprazole 40 mg bid. She has previously tried (and failed) Protonix and Prevacid.

## 2019-12-02 NOTE — Telephone Encounter (Signed)
Spoke with pt. Pt is aware that medication was sent into her pharmacy.

## 2019-12-13 DIAGNOSIS — E7849 Other hyperlipidemia: Secondary | ICD-10-CM | POA: Diagnosis not present

## 2019-12-13 DIAGNOSIS — N183 Chronic kidney disease, stage 3 unspecified: Secondary | ICD-10-CM | POA: Diagnosis not present

## 2019-12-25 DIAGNOSIS — H02889 Meibomian gland dysfunction of unspecified eye, unspecified eyelid: Secondary | ICD-10-CM | POA: Diagnosis not present

## 2019-12-25 DIAGNOSIS — H26491 Other secondary cataract, right eye: Secondary | ICD-10-CM | POA: Diagnosis not present

## 2019-12-25 DIAGNOSIS — Z961 Presence of intraocular lens: Secondary | ICD-10-CM | POA: Diagnosis not present

## 2019-12-25 DIAGNOSIS — H04123 Dry eye syndrome of bilateral lacrimal glands: Secondary | ICD-10-CM | POA: Diagnosis not present

## 2020-01-10 DIAGNOSIS — Z23 Encounter for immunization: Secondary | ICD-10-CM | POA: Diagnosis not present

## 2020-01-22 ENCOUNTER — Other Ambulatory Visit (HOSPITAL_COMMUNITY)
Admission: RE | Admit: 2020-01-22 | Discharge: 2020-01-22 | Disposition: A | Discharge status: Home / Self Care | Payer: Medicare Other | Source: Ambulatory Visit | Attending: Internal Medicine | Admitting: Internal Medicine

## 2020-01-22 ENCOUNTER — Other Ambulatory Visit: Payer: Self-pay

## 2020-01-22 DIAGNOSIS — Z20822 Contact with and (suspected) exposure to covid-19: Secondary | ICD-10-CM | POA: Diagnosis not present

## 2020-01-23 ENCOUNTER — Other Ambulatory Visit (HOSPITAL_COMMUNITY): Payer: Medicare Other

## 2020-01-23 LAB — SARS CORONAVIRUS 2 (TAT 6-24 HRS): SARS Coronavirus 2: NEGATIVE

## 2020-01-24 ENCOUNTER — Encounter (HOSPITAL_COMMUNITY)
Admission: RE | Disposition: A | Discharge status: Home / Self Care | Payer: Self-pay | Source: Home / Self Care | Attending: Internal Medicine

## 2020-01-24 ENCOUNTER — Encounter (HOSPITAL_COMMUNITY): Payer: Self-pay | Admitting: Internal Medicine

## 2020-01-24 ENCOUNTER — Ambulatory Visit (HOSPITAL_COMMUNITY)
Admission: RE | Admit: 2020-01-24 | Discharge: 2020-01-24 | Disposition: A | Discharge status: Home / Self Care | Payer: Medicare Other | Attending: Internal Medicine | Admitting: Internal Medicine

## 2020-01-24 ENCOUNTER — Encounter (HOSPITAL_COMMUNITY): Payer: Self-pay | Admitting: Anesthesiology

## 2020-01-24 ENCOUNTER — Other Ambulatory Visit: Payer: Self-pay

## 2020-01-24 DIAGNOSIS — K259 Gastric ulcer, unspecified as acute or chronic, without hemorrhage or perforation: Secondary | ICD-10-CM | POA: Insufficient documentation

## 2020-01-24 DIAGNOSIS — Z79899 Other long term (current) drug therapy: Secondary | ICD-10-CM | POA: Insufficient documentation

## 2020-01-24 DIAGNOSIS — R1013 Epigastric pain: Secondary | ICD-10-CM | POA: Diagnosis not present

## 2020-01-24 DIAGNOSIS — E039 Hypothyroidism, unspecified: Secondary | ICD-10-CM | POA: Insufficient documentation

## 2020-01-24 DIAGNOSIS — K219 Gastro-esophageal reflux disease without esophagitis: Secondary | ICD-10-CM | POA: Diagnosis not present

## 2020-01-24 DIAGNOSIS — K317 Polyp of stomach and duodenum: Secondary | ICD-10-CM | POA: Diagnosis not present

## 2020-01-24 DIAGNOSIS — Z7952 Long term (current) use of systemic steroids: Secondary | ICD-10-CM | POA: Diagnosis not present

## 2020-01-24 DIAGNOSIS — Z79890 Hormone replacement therapy (postmenopausal): Secondary | ICD-10-CM | POA: Insufficient documentation

## 2020-01-24 DIAGNOSIS — R112 Nausea with vomiting, unspecified: Secondary | ICD-10-CM

## 2020-01-24 DIAGNOSIS — K3189 Other diseases of stomach and duodenum: Secondary | ICD-10-CM | POA: Diagnosis not present

## 2020-01-24 DIAGNOSIS — K319 Disease of stomach and duodenum, unspecified: Secondary | ICD-10-CM | POA: Insufficient documentation

## 2020-01-24 HISTORY — PX: ESOPHAGOGASTRODUODENOSCOPY: SHX5428

## 2020-01-24 HISTORY — PX: BIOPSY: SHX5522

## 2020-01-24 SURGERY — EGD (ESOPHAGOGASTRODUODENOSCOPY)
Anesthesia: Moderate Sedation

## 2020-01-24 MED ORDER — MEPERIDINE HCL 100 MG/ML IJ SOLN
INTRAMUSCULAR | Status: DC | PRN
  Administered 2020-01-24: 25 mg via INTRAVENOUS
  Administered 2020-01-24: 15 mg via INTRAVENOUS
  Administered 2020-01-24: 10 mg via INTRAVENOUS

## 2020-01-24 MED ORDER — ONDANSETRON HCL 4 MG/2ML IJ SOLN
INTRAMUSCULAR | Status: DC | PRN
  Administered 2020-01-24: 4 mg via INTRAVENOUS

## 2020-01-24 MED ORDER — MIDAZOLAM HCL 5 MG/5ML IJ SOLN
INTRAMUSCULAR | Status: AC
  Filled 2020-01-24: qty 10

## 2020-01-24 MED ORDER — ONDANSETRON HCL 4 MG/2ML IJ SOLN
INTRAMUSCULAR | Status: AC
  Filled 2020-01-24: qty 2

## 2020-01-24 MED ORDER — LIDOCAINE VISCOUS HCL 2 % MT SOLN
OROMUCOSAL | Status: AC
  Filled 2020-01-24: qty 15

## 2020-01-24 MED ORDER — MEPERIDINE HCL 50 MG/ML IJ SOLN
INTRAMUSCULAR | Status: AC
  Filled 2020-01-24: qty 1

## 2020-01-24 MED ORDER — LIDOCAINE VISCOUS HCL 2 % MT SOLN
OROMUCOSAL | Status: DC | PRN
  Administered 2020-01-24: 1 via OROMUCOSAL

## 2020-01-24 MED ORDER — MIDAZOLAM HCL 5 MG/5ML IJ SOLN
INTRAMUSCULAR | Status: DC | PRN
  Administered 2020-01-24: 1 mg via INTRAVENOUS
  Administered 2020-01-24 (×2): 2 mg via INTRAVENOUS

## 2020-01-24 MED ORDER — SODIUM CHLORIDE 0.9 % IV SOLN: INTRAVENOUS | Status: DC

## 2020-01-24 MED ORDER — STERILE WATER FOR IRRIGATION IR SOLN
Status: DC | PRN
  Administered 2020-01-24: 2.5 mL

## 2020-01-24 NOTE — Discharge Instructions (Signed)
EGD Discharge instructions Please read the instructions outlined below and refer to this sheet in the next few weeks. These discharge instructions provide you with general information on caring for yourself after you leave the hospital. Your doctor may also give you specific instructions. While your treatment has been planned according to the most current medical practices available, unavoidable complications occasionally occur. If you have any problems or questions after discharge, please call your doctor. ACTIVITY  You may resume your regular activity but move at a slower pace for the next 24 hours.   Take frequent rest periods for the next 24 hours.   Walking will help expel (get rid of) the air and reduce the bloated feeling in your abdomen.   No driving for 24 hours (because of the anesthesia (medicine) used during the test).   You may shower.   Do not sign any important legal documents or operate any machinery for 24 hours (because of the anesthesia used during the test).  NUTRITION  Drink plenty of fluids.   You may resume your normal diet.   Begin with a light meal and progress to your normal diet.   Avoid alcoholic beverages for 24 hours or as instructed by your caregiver.  MEDICATIONS  You may resume your normal medications unless your caregiver tells you otherwise.  WHAT YOU CAN EXPECT TODAY  You may experience abdominal discomfort such as a feeling of fullness or "gas" pains.  FOLLOW-UP  Your doctor will discuss the results of your test with you.  SEEK IMMEDIATE MEDICAL ATTENTION IF ANY OF THE FOLLOWING OCCUR:  Excessive nausea (feeling sick to your stomach) and/or vomiting.   Severe abdominal pain and distention (swelling).   Trouble swallowing.   Temperature over 101 F (37.8 C).   Rectal bleeding or vomiting of blood.   GERD information provided  Continue Dexilant 60 mg daily  Further recommendations to follow pending review of pathology  report  Office visit with [place] in 3 months -   PLEASE CALL DR. Roseanne Kaufman OFFICE AT [phone]  TO SCHEDULE APPT.  At patient request, I called Gwyndolyn Saxon at [phone] and reviewed results    Gastroesophageal Reflux Disease, Adult Gastroesophageal reflux (GER) happens when acid from the stomach flows up into the tube that connects the mouth and the stomach (esophagus). Normally, food travels down the esophagus and stays in the stomach to be digested. With GER, food and stomach acid sometimes move back up into the esophagus. You may have a disease called gastroesophageal reflux disease (GERD) if the reflux:  Happens often.  Causes frequent or very bad symptoms.  Causes problems such as damage to the esophagus. When this happens, the esophagus becomes sore and swollen (inflamed). Over time, GERD can make small holes (ulcers) in the lining of the esophagus. What are the causes? This condition is caused by a problem with the muscle between the esophagus and the stomach. When this muscle is weak or not normal, it does not close properly to keep food and acid from coming back up from the stomach. The muscle can be weak because of:  Tobacco use.  Pregnancy.  Having a certain type of hernia (hiatal hernia).  Alcohol use.  Certain foods and drinks, such as coffee, chocolate, onions, and peppermint. What increases the risk? You are more likely to develop this condition if you:  Are overweight.  Have a disease that affects your connective tissue.  Use NSAID medicines. What are the signs or symptoms? Symptoms of this condition include:  Heartburn.  Difficult or painful swallowing.  The feeling of having a lump in the throat.  A bitter taste in the mouth.  Bad breath.  Having a lot of saliva.  Having an upset or bloated stomach.  Belching.  Chest pain. Different conditions can cause chest pain. Make sure you see your doctor if you have chest pain.  Shortness of breath or noisy  breathing (wheezing).  Ongoing (chronic) cough or a cough at night.  Wearing away of the surface of teeth (tooth enamel).  Weight loss. How is this treated? Treatment will depend on how bad your symptoms are. Your doctor may suggest:  Changes to your diet.  Medicine.  Surgery. Follow these instructions at home: Eating and drinking   Follow a diet as told by your doctor. You may need to avoid foods and drinks such as: ? Coffee and tea (with or without caffeine). ? Drinks that contain alcohol. ? Energy drinks and sports drinks. ? Bubbly (carbonated) drinks or sodas. ? Chocolate and cocoa. ? Peppermint and mint flavorings. ? Garlic and onions. ? Horseradish. ? Spicy and acidic foods. These include peppers, chili powder, curry powder, vinegar, hot sauces, and BBQ sauce. ? Citrus fruit juices and citrus fruits, such as oranges, lemons, and limes. ? Tomato-based foods. These include red sauce, chili, salsa, and pizza with red sauce. ? Fried and fatty foods. These include donuts, french fries, potato chips, and high-fat dressings. ? High-fat meats. These include hot dogs, rib eye steak, sausage, ham, and bacon. ? High-fat dairy items, such as whole milk, butter, and cream cheese.  Eat small meals often. Avoid eating large meals.  Avoid drinking large amounts of liquid with your meals.  Avoid eating meals during the 2-3 hours before bedtime.  Avoid lying down right after you eat.  Do not exercise right after you eat. Lifestyle   Do not use any products that contain nicotine or tobacco. These include cigarettes, e-cigarettes, and chewing tobacco. If you need help quitting, ask your doctor.  Try to lower your stress. If you need help doing this, ask your doctor.  If you are overweight, lose an amount of weight that is healthy for you. Ask your doctor about a safe weight loss goal. General instructions  Pay attention to any changes in your symptoms.  Take over-the-counter  and prescription medicines only as told by your doctor. Do not take aspirin, ibuprofen, or other NSAIDs unless your doctor says it is okay.  Wear loose clothes. Do not wear anything tight around your waist.  Raise (elevate) the head of your bed about 6 inches (15 cm).  Avoid bending over if this makes your symptoms worse.  Keep all follow-up visits as told by your doctor. This is important. Contact a doctor if:  You have new symptoms.  You lose weight and you do not know why.  You have trouble swallowing or it hurts to swallow.  You have wheezing or a cough that keeps happening.  Your symptoms do not get better with treatment.  You have a hoarse voice. Get help right away if:  You have pain in your arms, neck, jaw, teeth, or back.  You feel sweaty, dizzy, or light-headed.  You have chest pain or shortness of breath.  You throw up (vomit) and your throw-up looks like blood or coffee grounds.  You pass out (faint).  Your poop (stool) is bloody or black.  You cannot swallow, drink, or eat. Summary  If a person has gastroesophageal reflux  disease (GERD), food and stomach acid move back up into the esophagus and cause symptoms or problems such as damage to the esophagus.  Treatment will depend on how bad your symptoms are.  Follow a diet as told by your doctor.  Take all medicines only as told by your doctor. This information is not intended to replace advice given to you by your health care provider. Make sure you discuss any questions you have with your health care provider. Document Revised: 05/09/2018 Document Reviewed: 05/09/2018 Elsevier Patient Education  [place].  PATIENT INSTRUCTIONS POST-ANESTHESIA  IMMEDIATELY FOLLOWING SURGERY:  Do not drive or operate machinery for the first twenty four hours after surgery.  Do not make any important decisions for twenty four hours after surgery or while taking narcotic pain medications or sedatives.  If you  develop intractable nausea and vomiting or a severe headache please notify your doctor immediately.  FOLLOW-UP:  Please make an appointment with your surgeon as instructed. You do not need to follow up with anesthesia unless specifically instructed to do so.  WOUND CARE INSTRUCTIONS (if applicable):  Keep a dry clean dressing on the anesthesia/puncture wound site if there is drainage.  Once the wound has quit draining you may leave it open to air.  Generally you should leave the bandage intact for twenty four hours unless there is drainage.  If the epidural site drains for more than 36-48 hours please call the anesthesia department.  QUESTIONS?:  Please feel free to call your physician or the hospital operator if you have any questions, and they will be happy to assist you.

## 2020-01-24 NOTE — Op Note (Signed)
[facility] Patient Name: Brittany Shields Procedure Date: 01/24/2020 1:49 PM MRN: [id number] Date of Birth: 30-Oct-1951 Attending MD: Norvel Richards , MD CSN: [id number] Age: 69 Admit Type: Outpatient Procedure:                Upper GI endoscopy Indications:              Dyspepsia, Suspected gastro-esophageal reflux                            disease Providers:                Norvel Richards, MD, Lurline Del, RN, Randa Spike, Technician Referring MD:              Medicines:                Midazolam 5 mg IV, Meperidine 50 mg IV, Ondansetron                            4 mg IV Complications:            No immediate complications. Estimated Blood Loss:     Estimated blood loss was minimal. Procedure:                Pre-Anesthesia Assessment:                           - Prior to the procedure, a History and Physical                            was performed, and patient medications and                            allergies were reviewed. The patient's tolerance of                            previous anesthesia was also reviewed. The risks                            and benefits of the procedure and the sedation                            options and risks were discussed with the patient.                            All questions were answered, and informed consent                            was obtained. Prior Anticoagulants: The patient has                            taken no previous anticoagulant or antiplatelet                            agents.  ASA Grade Assessment: II - A patient with                            mild systemic disease. After reviewing the risks                            and benefits, the patient was deemed in                            satisfactory condition to undergo the procedure.                           After obtaining informed consent, the endoscope was                            passed under direct vision. Throughout  the                            procedure, the patient's blood pressure, pulse, and                            oxygen saturations were monitored continuously. The                            [id number] scope was introduced through the                            mouth, and advanced to the second part of duodenum.                            The upper GI endoscopy was accomplished without                            difficulty. The patient tolerated the procedure                            well. Scope In: 2:14:27 PM Scope Out: 2:20:11 PM Total Procedure Duration: 0 hours 5 minutes 44 seconds  Findings:      The examined esophagus was normal.      Multiple 7 mm pedunculated and sessile polyps were found in the gastric       body. Antral erosions present. No ulcer or infiltrating process.       Biopsies of the eroded mucosa and polyp taken separately.      The duodenal bulb and second portion of the duodenum were normal. Impression:               - Normal esophagus.                           - Multiple gastric polyps. Status post biopsy.                            Gastric erosions. Status post biopsy                           -  Normal duodenal bulb and second portion of the                            duodenum.                           -Patient stating that Dexilant has made a                            tremendous difference symptom improvement. Moderate Sedation:      Moderate (conscious) sedation was administered by the endoscopy nurse       and supervised by the endoscopist. The following parameters were       monitored: oxygen saturation, heart rate, blood pressure, respiratory       rate, EKG, adequacy of pulmonary ventilation, and response to care.       Total physician intraservice time was 19 minutes. Recommendation:           - Patient has a contact number available for                            emergencies. The signs and symptoms of potential                             delayed complications were discussed with the                            patient. Return to normal activities tomorrow.                            Written discharge instructions were provided to the                            patient.                           - Advance diet as tolerated. Continue Dexilant 60                            mg daily. Follow-up on pathology. Office visit in 3                            months Procedure Code(s):        --- Professional ---                           [phone], Esophagogastroduodenoscopy, flexible,                            transoral; diagnostic, including collection of                            specimen(s) by brushing or washing, when performed                            (separate procedure)  G0500, Moderate sedation services provided by the                            same physician or other qualified health care                            professional performing a gastrointestinal                            endoscopic service that sedation supports,                            requiring the presence of an independent trained                            observer to assist in the monitoring of the                            patient's level of consciousness and physiological                            status; initial 15 minutes of intra-service time;                            patient age 22 years or older (additional time may                            be reported with [phone], as appropriate) Diagnosis Code(s):        --- Professional ---                           K31.7, Polyp of stomach and duodenum                           R10.13, Epigastric pain CPT copyright 2019 American Medical Association. All rights reserved. The codes documented in this report are preliminary and upon coder review may  be revised to meet current compliance requirements. Cristopher Estimable. Kenya Shiraishi, MD Norvel Richards, MD 01/24/2020 2:33:12 PM This report  has been signed electronically. Number of Addenda: 0

## 2020-01-24 NOTE — H&P (Signed)
@LOGO @   Primary Care Physician:  Caryl Bis, MD Primary Gastroenterologist:  Dr. Gala Romney  Pre-Procedure History & Physical: HPI:  Brittany Shields is a 69 y.o. female here for further evaluation of refractory reflux symptoms nausea vomiting.  Switch to Dexilant 60 mg daily 2 months ago.  This has been associated with dramatic improvement in her reflux nausea symptoms.  No dysphagia.  Here for an EGD to further evaluate.  Past Medical History:  Diagnosis Date  . Arthritis   . Chronic sinusitis   . Generalized headaches   . GERD (gastroesophageal reflux disease)   . Hypothyroidism     Past Surgical History:  Procedure Laterality Date  . ANTERIOR CERVICAL DECOMP/DISCECTOMY FUSION N/A 08/28/2017   Procedure: ANTERIOR CERVICAL DECOMPRESSION AND FUSION CERVICAL FIVE-SIX,CERVICAL SIX-SEVEN;  Surgeon: Earnie Larsson, MD;  Location: [place];  Service: Neurosurgery;  Laterality: N/A;  anterior  . CARPAL TUNNEL RELEASE Right 2001  . CATARACT EXTRACTION W/PHACO Right 08/08/2017   Procedure: CATARACT EXTRACTION PHACO AND INTRAOCULAR LENS PLACEMENT (IOC);  Surgeon: Rutherford Guys, MD;  Location: [facility];  Service: Ophthalmology;  Laterality: Right;  CDE: 4.18  . CATARACT EXTRACTION W/PHACO Left 11/10/2017   Procedure: CATARACT EXTRACTION PHACO AND INTRAOCULAR LENS PLACEMENT (IOC);  Surgeon: Rutherford Guys, MD;  Location: [facility];  Service: Ophthalmology;  Laterality: Left;  CDE: 3.27  . CHOLECYSTECTOMY    . COLONOSCOPY    . CYST EXCISION     right eye  . EYE SURGERY    . HAMMER TOE SURGERY Bilateral   . NASAL/SINUS ENDOSCOPY    . TUBAL LIGATION    . tubal reversal  1991    Prior to Admission medications   Medication Sig Start Date End Date Taking? Authorizing Provider  acetaminophen (TYLENOL) 500 MG tablet Take 1,000 mg by mouth every 6 (six) hours as needed for moderate pain or headache.   Yes [provider]  Artificial Tear Ointment (EQ RESTORE PM OP) Place 1 drop into both eyes at  bedtime as needed (for dry eyes).    Yes [provider]  aspirin-acetaminophen-caffeine (EXCEDRIN MIGRAINE) [phone] MG tablet Take 1 tablet by mouth every 6 (six) hours as needed for headache or migraine.   Yes [provider]  Calcium Carb-Cholecalciferol (CALCIUM 600+D3 PO) Take 2 tablets by mouth every morning.    Yes [provider]  docusate sodium (COLACE) 100 MG capsule Take 200 mg by mouth at bedtime.   Yes [provider]  doxycycline (VIBRAMYCIN) 50 MG capsule Take 50 mg by mouth at bedtime.  08/21/19 08/20/20 Yes [provider]  estradiol (ESTRACE) 1 MG tablet Take 1 mg by mouth at bedtime.   Yes [provider]  Hypertonic Nasal Wash (SINUS RINSE NA) Place 1 each into the nose 2 (two) times daily.   Yes [provider]  levothyroxine (SYNTHROID, LEVOTHROID) 50 MCG tablet Take 50 mcg by mouth daily before breakfast.   Yes [provider]  omeprazole (PRILOSEC) 40 MG capsule Take 1 capsule (40 mg total) by mouth 2 (two) times daily. 11/29/19  Yes Carlis Stable, NP  predniSONE (DELTASONE) 5 MG tablet Take 5 mg by mouth daily with breakfast.   Yes [provider]  Probiotic Product (PROBIOTIC DAILY) CAPS Take 1 capsule by mouth daily.   Yes [provider]  progesterone (PROMETRIUM) 100 MG capsule Take 100 mg by mouth at bedtime.    Yes [provider]  venlafaxine XR (EFFEXOR-XR) 75 MG 24 hr capsule  Take 75 mg by mouth at bedtime.   Yes [provider]  dexlansoprazole (DEXILANT) 60 MG capsule Take 1 capsule (60 mg total) by mouth daily. Patient not taking: Reported on 01/20/2020 11/27/19   Carlis Stable, NP  ondansetron (ZOFRAN) 4 MG tablet Take 1 tablet (4 mg total) by mouth every 8 (eight) hours as needed for nausea or vomiting. 11/21/19   Carlis Stable, NP    Allergies as of 11/22/2019 - Review Complete 11/21/2019  Allergen Reaction Noted  . Zithromax [azithromycin] Rash 08/02/2017     Family History  Problem Relation Age of Onset  . Colon cancer Neg Hx     Social History   Socioeconomic History  . Marital status: Married    Spouse name: Not on file  . Number of children: Not on file  . Years of education: Not on file  . Highest education level: Not on file  Occupational History  . Not on file  Tobacco Use  . Smoking status: Never Smoker  . Smokeless tobacco: Never Used  Substance and Sexual Activity  . Alcohol use: No  . Drug use: No  . Sexual activity: Never  Other Topics Concern  . Not on file  Social History Narrative  . Not on file   Social Determinants of Health   Financial Resource Strain:   . Difficulty of Paying Living Expenses:   Food Insecurity:   . Worried About Charity fundraiser in the Last Year:   . Arboriculturist in the Last Year:   Transportation Needs:   . Film/video editor (Medical):   Marland Kitchen Lack of Transportation (Non-Medical):   Physical Activity:   . Days of Exercise per Week:   . Minutes of Exercise per Session:   Stress:   . Feeling of Stress :   Social Connections:   . Frequency of Communication with Friends and Family:   . Frequency of Social Gatherings with Friends and Family:   . Attends Religious Services:   . Active Member of Clubs or Organizations:   . Attends Archivist Meetings:   Marland Kitchen Marital Status:   Intimate Partner Violence:   . Fear of Current or Ex-Partner:   . Emotionally Abused:   Marland Kitchen Physically Abused:   . Sexually Abused:     Review of Systems: See HPI, otherwise negative ROS  Physical Exam: BP (!) 162/89   Pulse 98   Temp 98 F (36.7 C) (Oral)   Resp 20   SpO2 99%  General:   Alert,  Well-developed, well-nourished, pleasant and cooperative in NAD Neck:  Supple; no masses or thyromegaly. No significant cervical adenopathy. Lungs:  Clear throughout to auscultation.   No wheezes, crackles, or rhonchi. No acute distress. Heart:  Regular rate and rhythm; no murmurs, clicks,  rubs,  or gallops. Abdomen: Non-distended, normal bowel sounds.  Soft and nontender without appreciable mass or hepatosplenomegaly.  Pulses:  Normal pulses noted. Extremities:  Without clubbing or edema.  Impression/Plan: 69 year old lady with apparent refractory reflux symptoms nausea vomiting No dysphagia.  Dramatic improvement with Dexilant 60 mg daily. Offer the patient a diagnostic EGD per plan.   Notice: This dictation was prepared with Dragon dictation along with smaller phrase technology. Any transcriptional errors that result from this process are unintentional and may not be corrected upon review.

## 2020-01-24 NOTE — Anesthesia Preprocedure Evaluation (Deleted)
Anesthesia Evaluation  Patient identified by MRN, date of birth, ID band Patient awake    Reviewed: Allergy & Precautions, H&P , NPO status , Patient's Chart, lab work & pertinent test results, reviewed documented beta blocker date and time   Airway Mallampati: I       Dental no notable dental hx.    Pulmonary neg pulmonary ROS,    Pulmonary exam normal        Cardiovascular Normal cardiovascular exam     Neuro/Psych  Headaches, negative psych ROS   GI/Hepatic Neg liver ROS, GERD  ,  Endo/Other  Hypothyroidism   Renal/GU negative Renal ROS     Musculoskeletal   Abdominal   Peds  Hematology negative hematology ROS (+)   Anesthesia Other Findings   Reproductive/Obstetrics negative OB ROS                             Anesthesia Physical Anesthesia Plan  ASA: III  Anesthesia Plan:    Post-op Pain Management:    Induction:   PONV Risk Score and Plan:   Airway Management Planned:   Additional Equipment:   Intra-op Plan:   Post-operative Plan:   Informed Consent:   Plan Discussed with:   Anesthesia Plan Comments:         Anesthesia Quick Evaluation

## 2020-01-28 ENCOUNTER — Other Ambulatory Visit: Payer: Self-pay

## 2020-01-28 LAB — SURGICAL PATHOLOGY

## 2020-01-29 ENCOUNTER — Encounter: Payer: Self-pay | Admitting: Internal Medicine

## 2020-01-29 DIAGNOSIS — K219 Gastro-esophageal reflux disease without esophagitis: Secondary | ICD-10-CM | POA: Diagnosis not present

## 2020-01-29 DIAGNOSIS — E039 Hypothyroidism, unspecified: Secondary | ICD-10-CM | POA: Diagnosis not present

## 2020-01-29 DIAGNOSIS — E782 Mixed hyperlipidemia: Secondary | ICD-10-CM | POA: Diagnosis not present

## 2020-01-29 DIAGNOSIS — M797 Fibromyalgia: Secondary | ICD-10-CM | POA: Diagnosis not present

## 2020-01-29 DIAGNOSIS — N183 Chronic kidney disease, stage 3 unspecified: Secondary | ICD-10-CM | POA: Diagnosis not present

## 2020-02-03 DIAGNOSIS — E782 Mixed hyperlipidemia: Secondary | ICD-10-CM | POA: Diagnosis not present

## 2020-02-03 DIAGNOSIS — M5030 Other cervical disc degeneration, unspecified cervical region: Secondary | ICD-10-CM | POA: Diagnosis not present

## 2020-02-03 DIAGNOSIS — M3130 Wegener's granulomatosis without renal involvement: Secondary | ICD-10-CM | POA: Diagnosis not present

## 2020-02-03 DIAGNOSIS — G43909 Migraine, unspecified, not intractable, without status migrainosus: Secondary | ICD-10-CM | POA: Diagnosis not present

## 2020-02-03 DIAGNOSIS — E039 Hypothyroidism, unspecified: Secondary | ICD-10-CM | POA: Diagnosis not present

## 2020-02-03 DIAGNOSIS — M797 Fibromyalgia: Secondary | ICD-10-CM | POA: Diagnosis not present

## 2020-02-03 DIAGNOSIS — Z6824 Body mass index (BMI) 24.0-24.9, adult: Secondary | ICD-10-CM | POA: Diagnosis not present

## 2020-02-03 DIAGNOSIS — K219 Gastro-esophageal reflux disease without esophagitis: Secondary | ICD-10-CM | POA: Diagnosis not present

## 2020-02-04 DIAGNOSIS — Z1231 Encounter for screening mammogram for malignant neoplasm of breast: Secondary | ICD-10-CM | POA: Diagnosis not present

## 2020-02-06 ENCOUNTER — Encounter: Payer: Self-pay | Admitting: Internal Medicine

## 2020-02-08 DIAGNOSIS — Z23 Encounter for immunization: Secondary | ICD-10-CM | POA: Diagnosis not present

## 2020-02-20 NOTE — Progress Notes (Signed)
Referring Provider: Caryl Bis, MD Primary Care Physician:  Caryl Bis, MD Primary GI Physician: Dr. Gala Romney  Chief Complaint  Patient presents with  . Rectal Bleeding    HPI:   Brittany Shields is a 69 y.o. female presenting today to discuss scheduling colonoscopy and rectal bleeding. History of GERD and N/V.   Last seen in out office on 11/21/19.  She had worsening GERD symptoms with associated nausea and less frequently vomiting despite initial improvement on Prevacid twice a day although she was taking 2 pills in the morning rather than 1 pill twice daily.  Also continued with Excedrin Migraine use.  Plans to try Dexilant 60 mg daily and pursue EGD for further evaluation.  Zofran was also provided.  Dexilant worked well but ultimately was too expensive so she was started on omeprazole mg BID.    EGD 01/24/2020 with normal esophagus, multiple gastric polyps s/p biopsy, gastric erosions s/p biopsied, normal examined duodenum.  Pathology revealed fundic gland polyp and reactive gastropathy with erosion, no H. Pylori.  Today:  Rectal bleeding started intermittently 1 year ago with BMs. Now it is daily which started about 3 months ago. On toilet tissue and in toilet water. Bright red. Doesn't look like it is in the stool. Had constipation in Feb. Used MiraLAX and 2 enemas. Constipation has now resolved but noted rectal bleeding worsened at that time. No rectal pain or burning. Will spot blood during the day. Wearing a liner in her underware. No unintentional weight loss. No abdominal pain. GERD is now well controlled. No dysphagia. N/V has resolved.   Had hemorrhoid flare after constipation. Used preparation H which cleared them up. Hemorrhoids were prolapsed on the outside but reduced with preparation H. Colonoscopy in 2013. Thinks this may have been at [facility]. No polyps.   No lightheadedness, pre-syncope or syncope. Gets headaches. Tries not to take Excedrine but will take it if  she has to. Uses tylenol first.   Past Medical History:  Diagnosis Date  . Arthritis   . Chronic sinusitis   . Generalized headaches   . GERD (gastroesophageal reflux disease)   . Hypothyroidism     Past Surgical History:  Procedure Laterality Date  . ANTERIOR CERVICAL DECOMP/DISCECTOMY FUSION N/A 08/28/2017   Procedure: ANTERIOR CERVICAL DECOMPRESSION AND FUSION CERVICAL FIVE-SIX,CERVICAL SIX-SEVEN;  Surgeon: Earnie Larsson, MD;  Location: [place];  Service: Neurosurgery;  Laterality: N/A;  anterior  . BIOPSY  01/24/2020   Procedure: BIOPSY;  Surgeon: Daneil Dolin, MD;  [facility];  Service: Endoscopy;;  gastric polyp; gastric mucosa  . CARPAL TUNNEL RELEASE Right 2001  . CATARACT EXTRACTION W/PHACO Right 08/08/2017   Procedure: CATARACT EXTRACTION PHACO AND INTRAOCULAR LENS PLACEMENT (IOC);  Surgeon: Rutherford Guys, MD;  Location[facility];  Service: Ophthalmology;  Laterality: Right;  CDE: 4.18  . CATARACT EXTRACTION W/PHACO Left 11/10/2017   Procedure: CATARACT EXTRACTION PHACO AND INTRAOCULAR LENS PLACEMENT (IOC);  Surgeon: Rutherford Guys, MD;  Location[facility];  Service: Ophthalmology;  Laterality: Left;  CDE: 3.27  . CHOLECYSTECTOMY    . COLONOSCOPY    . CYST EXCISION     right eye  . ESOPHAGOGASTRODUODENOSCOPY N/A 01/24/2020   Procedure: ESOPHAGOGASTRODUODENOSCOPY (EGD);  Surgeon: Daneil Dolin, MD;  [facility];  Service: Endoscopy;  Laterality: N/A;  2:00pm  . EYE SURGERY    . HAMMER TOE SURGERY Bilateral   . NASAL/SINUS ENDOSCOPY    . TUBAL LIGATION    .  tubal reversal  1991    Current Outpatient Medications  Medication Sig Dispense Refill  . acetaminophen (TYLENOL) 500 MG tablet Take 1,000 mg by mouth every 6 (six) hours as needed for moderate pain or headache.    . Artificial Tear Ointment (EQ RESTORE PM OP) Place 1 drop into both eyes at bedtime as needed (for dry eyes).     Marland Kitchen aspirin-acetaminophen-caffeine (EXCEDRIN MIGRAINE) 250-250-65 MG  tablet Take 1 tablet by mouth every 6 (six) hours as needed for headache or migraine.    . Calcium Carb-Cholecalciferol (CALCIUM 600+D3 PO) Take 2 tablets by mouth every morning.     . Carboxymethylcellulose Sodium (REFRESH TEARS OP) Apply to eye as needed.    . docusate sodium (COLACE) 100 MG capsule Take 200 mg by mouth at bedtime.    Marland Kitchen doxycycline (VIBRAMYCIN) 50 MG capsule Take 50 mg by mouth at bedtime.     Marland Kitchen estradiol (ESTRACE) 1 MG tablet Take 1 mg by mouth at bedtime.    . Hypertonic Nasal Wash (SINUS RINSE NA) Place 1 each into the nose 2 (two) times daily.    Marland Kitchen levothyroxine (SYNTHROID, LEVOTHROID) 50 MCG tablet Take 50 mcg by mouth daily before breakfast.    . omeprazole (PRILOSEC) 40 MG capsule Take 1 capsule (40 mg total) by mouth 2 (two) times daily. 60 capsule 5  . ondansetron (ZOFRAN) 4 MG tablet Take 1 tablet (4 mg total) by mouth every 8 (eight) hours as needed for nausea or vomiting. 30 tablet 1  . predniSONE (DELTASONE) 5 MG tablet Take 5 mg by mouth daily with breakfast.    . Probiotic Product (PROBIOTIC DAILY) CAPS Take 1 capsule by mouth daily.    . progesterone (PROMETRIUM) 100 MG capsule Take 100 mg by mouth at bedtime.     Marland Kitchen venlafaxine XR (EFFEXOR-XR) 75 MG 24 hr capsule Take 75 mg by mouth at bedtime.     No current facility-administered medications for this visit.    Allergies as of 02/21/2020 - Review Complete 02/21/2020  Allergen Reaction Noted  . Zithromax [azithromycin] Hives and Rash 08/02/2017  . Lifitegrast Itching 05/25/2018    Family History  Problem Relation Age of Onset  . Colon cancer Neg Hx     Social History   Socioeconomic History  . Marital status: Married    Spouse name: Not on file  . Number of children: Not on file  . Years of education: Not on file  . Highest education level: Not on file  Occupational History  . Not on file  Tobacco Use  . Smoking status: Never Smoker  . Smokeless tobacco: Never Used  Substance and Sexual  Activity  . Alcohol use: No  . Drug use: No  . Sexual activity: Never  Other Topics Concern  . Not on file  Social History Narrative  . Not on file   Social Determinants of Health   Financial Resource Strain:   . Difficulty of Paying Living Expenses:   Food Insecurity:   . Worried About Charity fundraiser in the Last Year:   . Arboriculturist in the Last Year:   Transportation Needs:   . Film/video editor (Medical):   Marland Kitchen Lack of Transportation (Non-Medical):   Physical Activity:   . Days of Exercise per Week:   . Minutes of Exercise per Session:   Stress:   . Feeling of Stress :   Social Connections:   . Frequency of Communication with Friends and Family:   .  Frequency of Social Gatherings with Friends and Family:   . Attends Religious Services:   . Active Member of Clubs or Organizations:   . Attends Archivist Meetings:   Marland Kitchen Marital Status:     Review of Systems: Gen: Denies fever, chills, cold or flulike symptoms. CV: Denies chest pain or palpitations Resp: Mild SOB with exertion. No SOB at rest. No regular cough.  GI: See HPI Derm: Denies rash Heme: See HPI  Physical Exam: BP (!) 143/92   Pulse 92   Temp (!) 97.3 F (36.3 C) (Temporal)   Ht 5\' 4"  (1.626 m)   Wt 150 lb 3.2 oz (68.1 kg)   BMI 25.78 kg/m  General:   Alert and oriented. No distress noted. Pleasant and cooperative.  Head:  Normocephalic and atraumatic. Eyes:  Conjuctiva clear without scleral icterus. Heart:  S1, S2 present without murmurs appreciated. Lungs:  Clear to auscultation bilaterally. No wheezes, rales, or rhonchi. No distress.  Abdomen:  +BS, soft, non-tender and non-distended. No rebound or guarding. No HSM or masses noted. Msk:  Symmetrical without gross deformities. Normal posture. Extremities:  Without edema. Neurologic:  Alert and  oriented x4 Psych:  Normal mood and affect.

## 2020-02-21 ENCOUNTER — Encounter: Payer: Self-pay | Admitting: Gastroenterology

## 2020-02-21 ENCOUNTER — Other Ambulatory Visit: Payer: Self-pay

## 2020-02-21 ENCOUNTER — Ambulatory Visit (INDEPENDENT_AMBULATORY_CARE_PROVIDER_SITE_OTHER): Payer: Medicare Other | Admitting: Gastroenterology

## 2020-02-21 DIAGNOSIS — K219 Gastro-esophageal reflux disease without esophagitis: Secondary | ICD-10-CM | POA: Diagnosis not present

## 2020-02-21 DIAGNOSIS — K625 Hemorrhage of anus and rectum: Secondary | ICD-10-CM | POA: Insufficient documentation

## 2020-02-21 LAB — CBC WITH DIFFERENTIAL/PLATELET
Absolute Monocytes: 509 cells/uL (ref 200–950)
Basophils Absolute: 84 cells/uL (ref 0–200)
Basophils Relative: 1.1 %
Eosinophils Absolute: 152 cells/uL (ref 15–500)
Eosinophils Relative: 2 %
HCT: 43.6 % (ref 35.0–45.0)
Hemoglobin: 14.1 g/dL (ref 11.7–15.5)
Lymphs Abs: 996 cells/uL (ref 850–3900)
MCH: 28.3 pg (ref 27.0–33.0)
MCHC: 32.3 g/dL (ref 32.0–36.0)
MCV: 87.6 fL (ref 80.0–100.0)
MPV: 10.6 fL (ref 7.5–12.5)
Monocytes Relative: 6.7 %
Neutro Abs: 5860 cells/uL (ref 1500–7800)
Neutrophils Relative %: 77.1 %
Platelets: 321 10*3/uL (ref 140–400)
RBC: 4.98 10*6/uL (ref 3.80–5.10)
RDW: 13.2 % (ref 11.0–15.0)
Total Lymphocyte: 13.1 %
WBC: 7.6 10*3/uL (ref 3.8–10.8)

## 2020-02-21 MED ORDER — OMEPRAZOLE 40 MG PO CPDR: 40.0000 mg | DELAYED_RELEASE_CAPSULE | Freq: Two times a day (BID) | ORAL | 5 refills | Status: DC

## 2020-02-21 NOTE — Patient Instructions (Signed)
Please have your blood work completed. This is to check for anemia (low hemoglobin).   We will get you scheduled for a colonoscopy in the near future with Dr. Gala Romney.   Resume using preparation H twice daily for the next 7-10 days to treat hemorrhoids that may be flared and contributing to your bleeding.   Should you have perfuse rectal bleeding, for feel lightheaded, dizzy, like you may pass out, or have significant weakness, you should proceed to the emergency room.   Continue taking omeprazole 40 mg twice daily 30 minutes before breakfast and dinner. I am sending in refills for you.   We will call you with results and plan to follow-up after your procedure. Call with questions or concerns prior.   Aliene Altes, PA-C [facility]

## 2020-02-22 NOTE — Assessment & Plan Note (Signed)
Chronic history of GERD.  She had worsening symptoms in January 2021 on Prevacid twice daily (although she was taking 2 pills in the morning rather than 1 in the morning and evening).  Prevacid was switched to omeprazole 40 mg twice daily.  EGD on 01/24/2020 with normal esophagus, multiple gastric polyps s/p biopsy, gastric erosions s/p biopsy, normal examined duodenum.  Pathology with fundic gland polyps and reactive gastropathy with erosion, no H. pylori.  Her symptoms are now much improved/resolved with omeprazole 40 mg twice daily.  No dysphagia, upper abdominal pain, melena, nausea, or vomiting.  Trying to limit Excedrin Migraine use.  Continue omeprazole 40 mg twice daily. Follow-up after procedures for rectal bleeding as discussed below.

## 2020-02-22 NOTE — Progress Notes (Signed)
Hemoglobin within normal limits. Although she is having rectal bleeding, she she is not developing any sort of anemia.  Proceed with TCS as planned.

## 2020-02-22 NOTE — Assessment & Plan Note (Addendum)
69 year old female reporting 1 year history of intermittent bright red blood per rectum that is now occurring daily x3 months.  Blood on toilet tissue and in toilet water.  Admits to constipation in February with worsening rectal bleeding at that time.  Used MiraLAX and 2 enemas and constipation has resolved.  She had flare of hemorrhoids at that time that were prolapsed but reduced with Preparation H.  Denies rectal pain or irritation at this time.  Denies unintentional weight loss, diarrhea, abdominal pain, presyncope or syncope.  Last colonoscopy in 2013 which she thinks was at [facility].  Denies history of polyps.  No family history of colon cancer.  Suspect rectal bleeding may be secondary to hemorrhoids.  Cannot rule out colon polyps or malignancy.  She is going to need colonoscopy for further evaluation.  Update CBC. Advised she use Preparation H twice daily for the next 7 to 10 days for empiric treatment of hemorrhoids. Proceed with TCS with Dr. Gala Romney in the near future. The risks, benefits, and alternatives have been discussed in detail with patient. They have stated understanding and desire to proceed.  Advised that she proceed to the emergency room if she develops profuse rectal bleeding, significant lightheadedness, dizziness, presyncope, or weakness.  Request colonoscopy records. Follow-up after procedures.

## 2020-02-24 NOTE — Progress Notes (Signed)
CC'ED TO PCP 

## 2020-03-24 ENCOUNTER — Telehealth: Payer: Self-pay | Admitting: Internal Medicine

## 2020-03-24 ENCOUNTER — Ambulatory Visit: Payer: Medicare Other | Admitting: Nurse Practitioner

## 2020-03-24 NOTE — Telephone Encounter (Signed)
Called spoke with pt. She thought she had to take prep on the same day as she goes for her covid test. I advised 5/19 she is just starting clear liquids after lunch. She does not start prep until 5/20. She states she has misread the instructions. Nothing further needed

## 2020-03-24 NOTE — Telephone Encounter (Signed)
[phone] please call patient, she has concerns about taking the prep and then going to have the covid test, thinks it will cause her to not be able to control her bowels

## 2020-04-01 ENCOUNTER — Other Ambulatory Visit: Payer: Self-pay

## 2020-04-01 ENCOUNTER — Other Ambulatory Visit (HOSPITAL_COMMUNITY)
Admission: RE | Admit: 2020-04-01 | Discharge: 2020-04-01 | Disposition: A | Discharge status: Home / Self Care | Payer: Medicare Other | Source: Ambulatory Visit | Attending: Internal Medicine | Admitting: Internal Medicine

## 2020-04-01 DIAGNOSIS — Z01812 Encounter for preprocedural laboratory examination: Secondary | ICD-10-CM | POA: Diagnosis not present

## 2020-04-01 DIAGNOSIS — Z20822 Contact with and (suspected) exposure to covid-19: Secondary | ICD-10-CM | POA: Diagnosis not present

## 2020-04-02 LAB — SARS CORONAVIRUS 2 (TAT 6-24 HRS): SARS Coronavirus 2: NEGATIVE

## 2020-04-03 ENCOUNTER — Other Ambulatory Visit: Payer: Self-pay

## 2020-04-03 ENCOUNTER — Ambulatory Visit (HOSPITAL_COMMUNITY)
Admission: RE | Admit: 2020-04-03 | Discharge: 2020-04-03 | Disposition: A | Discharge status: Home / Self Care | Payer: Medicare Other | Attending: Internal Medicine | Admitting: Internal Medicine

## 2020-04-03 ENCOUNTER — Encounter (HOSPITAL_COMMUNITY): Payer: Self-pay | Admitting: Internal Medicine

## 2020-04-03 ENCOUNTER — Encounter (HOSPITAL_COMMUNITY)
Admission: RE | Disposition: A | Discharge status: Home / Self Care | Payer: Self-pay | Source: Home / Self Care | Attending: Internal Medicine

## 2020-04-03 DIAGNOSIS — R1013 Epigastric pain: Secondary | ICD-10-CM | POA: Diagnosis not present

## 2020-04-03 DIAGNOSIS — Z79899 Other long term (current) drug therapy: Secondary | ICD-10-CM | POA: Insufficient documentation

## 2020-04-03 DIAGNOSIS — K219 Gastro-esophageal reflux disease without esophagitis: Secondary | ICD-10-CM | POA: Diagnosis not present

## 2020-04-03 DIAGNOSIS — Z981 Arthrodesis status: Secondary | ICD-10-CM | POA: Diagnosis not present

## 2020-04-03 DIAGNOSIS — M1990 Unspecified osteoarthritis, unspecified site: Secondary | ICD-10-CM | POA: Insufficient documentation

## 2020-04-03 DIAGNOSIS — Z79890 Hormone replacement therapy (postmenopausal): Secondary | ICD-10-CM | POA: Diagnosis not present

## 2020-04-03 DIAGNOSIS — K921 Melena: Secondary | ICD-10-CM | POA: Insufficient documentation

## 2020-04-03 DIAGNOSIS — Z800 Family history of malignant neoplasm of digestive organs: Secondary | ICD-10-CM | POA: Diagnosis not present

## 2020-04-03 DIAGNOSIS — R112 Nausea with vomiting, unspecified: Secondary | ICD-10-CM | POA: Diagnosis not present

## 2020-04-03 DIAGNOSIS — K642 Third degree hemorrhoids: Secondary | ICD-10-CM | POA: Diagnosis not present

## 2020-04-03 DIAGNOSIS — Z9049 Acquired absence of other specified parts of digestive tract: Secondary | ICD-10-CM | POA: Diagnosis not present

## 2020-04-03 DIAGNOSIS — K5730 Diverticulosis of large intestine without perforation or abscess without bleeding: Secondary | ICD-10-CM | POA: Insufficient documentation

## 2020-04-03 DIAGNOSIS — E039 Hypothyroidism, unspecified: Secondary | ICD-10-CM | POA: Insufficient documentation

## 2020-04-03 DIAGNOSIS — K2900 Acute gastritis without bleeding: Secondary | ICD-10-CM | POA: Diagnosis not present

## 2020-04-03 DIAGNOSIS — H905 Unspecified sensorineural hearing loss: Secondary | ICD-10-CM | POA: Diagnosis not present

## 2020-04-03 DIAGNOSIS — K649 Unspecified hemorrhoids: Secondary | ICD-10-CM | POA: Diagnosis not present

## 2020-04-03 HISTORY — PX: COLONOSCOPY: SHX5424

## 2020-04-03 SURGERY — COLONOSCOPY
Anesthesia: Moderate Sedation

## 2020-04-03 MED ORDER — MIDAZOLAM HCL 5 MG/5ML IJ SOLN
INTRAMUSCULAR | Status: DC | PRN
  Administered 2020-04-03: 1 mg via INTRAVENOUS
  Administered 2020-04-03: 2 mg via INTRAVENOUS
  Administered 2020-04-03 (×2): 1 mg via INTRAVENOUS

## 2020-04-03 MED ORDER — MEPERIDINE HCL 100 MG/ML IJ SOLN
INTRAMUSCULAR | Status: DC | PRN
  Administered 2020-04-03: 25 mg
  Administered 2020-04-03: 15 mg

## 2020-04-03 MED ORDER — SODIUM CHLORIDE 0.9 % IV SOLN: INTRAVENOUS | Status: DC

## 2020-04-03 MED ORDER — ONDANSETRON HCL 4 MG/2ML IJ SOLN
INTRAMUSCULAR | Status: AC
  Filled 2020-04-03: qty 2

## 2020-04-03 MED ORDER — MEPERIDINE HCL 50 MG/ML IJ SOLN
INTRAMUSCULAR | Status: AC
  Filled 2020-04-03: qty 1

## 2020-04-03 MED ORDER — ONDANSETRON HCL 4 MG/2ML IJ SOLN
INTRAMUSCULAR | Status: DC | PRN
  Administered 2020-04-03: 4 mg via INTRAVENOUS

## 2020-04-03 MED ORDER — MIDAZOLAM HCL 5 MG/5ML IJ SOLN
INTRAMUSCULAR | Status: AC
  Filled 2020-04-03: qty 10

## 2020-04-03 MED ORDER — STERILE WATER FOR IRRIGATION IR SOLN: Status: DC | PRN

## 2020-04-03 NOTE — Op Note (Signed)
[facility] Patient Name: Brittany Shields Procedure Date: 04/03/2020 9:07 AM MRN: [id number] Date of Birth: June 15, 1951 Attending MD: Norvel Richards , MD CSN: [id number] Age: 69 Admit Type: Outpatient Procedure:                Colonoscopy Indications:              Hematochezia Providers:                Norvel Richards, MD, Janeece Riggers, RN, Randa Spike, Technician Referring MD:              Medicines:                Midazolam 5 mg IV, Meperidine 40 mg IV Complications:            No immediate complications. Estimated Blood Loss:     Estimated blood loss: none. Procedure:                Pre-Anesthesia Assessment:                           - Prior to the procedure, a History and Physical                            was performed, and patient medications and                            allergies were reviewed. The patient's tolerance of                            previous anesthesia was also reviewed. The risks                            and benefits of the procedure and the sedation                            options and risks were discussed with the patient.                            All questions were answered, and informed consent                            was obtained. Prior Anticoagulants: The patient has                            taken no previous anticoagulant or antiplatelet                            agents. ASA Grade Assessment: II - A patient with                            mild systemic disease. After reviewing the risks  and benefits, the patient was deemed in                            satisfactory condition to undergo the procedure.                           After obtaining informed consent, the colonoscope                            was passed under direct vision. Throughout the                            procedure, the patient's blood pressure, pulse, and                            oxygen  saturations were monitored continuously. The                            [id number]) scope was introduced through                            the anus and advanced to the the cecum, identified                            by appendiceal orifice and ileocecal valve. The                            colonoscopy was performed without difficulty. The                            patient tolerated the procedure well. The quality                            of the bowel preparation was adequate. Scope In: 9:33:00 AM Scope Out: 9:53:47 AM Scope Withdrawal Time: 0 hours 11 minutes 34 seconds  Total Procedure Duration: 0 hours 20 minutes 47 seconds  Findings:      The perianal and digital rectal examinations were normal.      Scattered small and large-mouthed diverticula were found in the entire       colon.      Non-bleeding external and internal hemorrhoids were found. The       hemorrhoids were moderate, medium-sized and Grade III (internal       hemorrhoids that prolapse but require manual reduction).      The exam was otherwise without abnormality on direct and retroflexion       views. Impression:               - Diverticulosis in the entire examined colon.                           - Non-bleeding external and internal hemorrhoids.                           - The examination was otherwise normal on direct  and retroflexion views.                           - No specimens collected. I suspect bleeding from                            hemorrhoids. Moderate Sedation:      Moderate (conscious) sedation was administered by the endoscopy nurse       and supervised by the endoscopist. The following parameters were       monitored: oxygen saturation, heart rate, blood pressure, respiratory       rate, EKG, adequacy of pulmonary ventilation, and response to care.       Total physician intraservice time was 27 minutes. Recommendation:           - Patient has a contact number  available for                            emergencies. The signs and symptoms of potential                            delayed complications were discussed with the                            patient. Return to normal activities tomorrow.                            Written discharge instructions were provided to the                            patient.                           - Advance diet as tolerated.                           - Continue present medications. Begin Benefiber as                            directed. May be a good hemorrhoid banding                            candidate. Pamphlet on hemorrhoid banding provided.                           - No repeat colonoscopy due to age.                           - Return to GI clinic in 6 weeks. Procedure Code(s):        --- Professional ---                           [phone], Colonoscopy, flexible; diagnostic, including                            collection of specimen(s) by brushing or washing,  when performed (separate procedure)                           [id number], Moderate sedation; each additional 15                            minutes intraservice time                           G0500, Moderate sedation services provided by the                            same physician or other qualified health care                            professional performing a gastrointestinal                            endoscopic service that sedation supports,                            requiring the presence of an independent trained                            observer to assist in the monitoring of the                            patient's level of consciousness and physiological                            status; initial 15 minutes of intra-service time;                            patient age 6 years or older (additional time may                            be reported with [phone], as appropriate) Diagnosis Code(s):        --- Professional  ---                           K64.2, Third degree hemorrhoids                           K92.1, Melena (includes Hematochezia)                           K57.30, Diverticulosis of large intestine without                            perforation or abscess without bleeding CPT copyright 2019 American Medical Association. All rights reserved. The codes documented in this report are preliminary and upon coder review may  be revised to meet current compliance requirements. Cristopher Estimable. Loy Mccartt, MD Norvel Richards, MD 04/03/2020 10:05:19 AM This report has been signed electronically. Number of Addenda: 0

## 2020-04-03 NOTE — Discharge Instructions (Signed)
Colonoscopy Discharge Instructions  Read the instructions outlined below and refer to this sheet in the next few weeks. These discharge instructions provide you with general information on caring for yourself after you leave the hospital. Your doctor may also give you specific instructions. While your treatment has been planned according to the most current medical practices available, unavoidable complications occasionally occur. If you have any problems or questions after discharge, call Dr. Gala Romney at [phone]. ACTIVITY  You may resume your regular activity, but move at a slower pace for the next 24 hours.   Take frequent rest periods for the next 24 hours.   Walking will help get rid of the air and reduce the bloated feeling in your belly (abdomen).   No driving for 24 hours (because of the medicine (anesthesia) used during the test).    Do not sign any important legal documents or operate any machinery for 24 hours (because of the anesthesia used during the test).  NUTRITION  Drink plenty of fluids.   You may resume your normal diet as instructed by your doctor.   Begin with a light meal and progress to your normal diet. Heavy or fried foods are harder to digest and may make you feel sick to your stomach (nauseated).   Avoid alcoholic beverages for 24 hours or as instructed.  MEDICATIONS  You may resume your normal medications unless your doctor tells you otherwise.  WHAT YOU CAN EXPECT TODAY  Some feelings of bloating in the abdomen.   Passage of more gas than usual.   Spotting of blood in your stool or on the toilet paper.  IF YOU HAD POLYPS REMOVED DURING THE COLONOSCOPY:  No aspirin products for 7 days or as instructed.   No alcohol for 7 days or as instructed.   Eat a soft diet for the next 24 hours.  FINDING OUT THE RESULTS OF YOUR TEST Not all test results are available during your visit. If your test results are not back during the visit, make an appointment  with your caregiver to find out the results. Do not assume everything is normal if you have not heard from your caregiver or the medical facility. It is important for you to follow up on all of your test results.  SEEK IMMEDIATE MEDICAL ATTENTION IF:  You have more than a spotting of blood in your stool.   Your belly is swollen (abdominal distention).   You are nauseated or vomiting.   You have a temperature over 101.   You have abdominal pain or discomfort that is severe or gets worse throughout the day.   Colon diverticulosis and hemorrhoid information  Likely bleeding from hemorrhoids  Hemorrhoid banding pamphlet provided  Begin Benefiber 1 tablespoon daily for 3 weeks; then increase to 2 tablespoons daily thereafter  Office visit with [place] in 6 weeks for possible hemorrhoid banding in the office (AB)  At patient request, I called husband Gwyndolyn Saxon at [phone] -no answer  I do not recommend a future colonoscopy unless new symptoms develop.   Diverticulosis  Diverticulosis is a condition that develops when small pouches (diverticula) form in the wall of the large intestine (colon). The colon is where water is absorbed and stool (feces) is formed. The pouches form when the inside layer of the colon pushes through weak spots in the outer layers of the colon. You may have a few pouches or many of them. The pouches usually do not cause problems unless they become inflamed or infected. When  this happens, the condition is called diverticulitis. What are the causes? The cause of this condition is not known. What increases the risk? The following factors may make you more likely to develop this condition:  Being older than age 25. Your risk for this condition increases with age. Diverticulosis is rare among people younger than age 53. By age 61, many people have it.  Eating a low-fiber diet.  Having frequent constipation.  Being overweight.  Not getting enough  exercise.  Smoking.  Taking over-the-counter pain medicines, like aspirin and ibuprofen.  Having a family history of diverticulosis. What are the signs or symptoms? In most people, there are no symptoms of this condition. If you do have symptoms, they may include:  Bloating.  Cramps in the abdomen.  Constipation or diarrhea.  Pain in the lower left side of the abdomen. How is this diagnosed? Because diverticulosis usually has no symptoms, it is most often diagnosed during an exam for other colon problems. The condition may be diagnosed by:  Using a flexible scope to examine the colon (colonoscopy).  Taking an X-ray of the colon after dye has been put into the colon (barium enema).  Having a CT scan. How is this treated? You may not need treatment for this condition. Your health care provider may recommend treatment to prevent problems. You may need treatment if you have symptoms or if you previously had diverticulitis. Treatment may include:  Eating a high-fiber diet.  Taking a fiber supplement.  Taking a live bacteria supplement (probiotic).  Taking medicine to relax your colon. Follow these instructions at home: Medicines  Take over-the-counter and prescription medicines only as told by your health care provider.  If told by your health care provider, take a fiber supplement or probiotic. Constipation prevention Your condition may cause constipation. To prevent or treat constipation, you may need to:  Drink enough fluid to keep your urine pale yellow.  Take over-the-counter or prescription medicines.  Eat foods that are high in fiber, such as beans, whole grains, and fresh fruits and vegetables.  Limit foods that are high in fat and processed sugars, such as fried or sweet foods.  General instructions  Try not to strain when you have a bowel movement.  Keep all follow-up visits as told by your health care provider. This is important. Contact a health care  provider if you:  Have pain in your abdomen.  Have bloating.  Have cramps.  Have not had a bowel movement in 3 days. Get help right away if:  Your pain gets worse.  Your bloating becomes very bad.  You have a fever or chills, and your symptoms suddenly get worse.  You vomit.  You have bowel movements that are bloody or black.  You have bleeding from your rectum. Summary  Diverticulosis is a condition that develops when small pouches (diverticula) form in the wall of the large intestine (colon).  You may have a few pouches or many of them.  This condition is most often diagnosed during an exam for other colon problems.  Treatment may include increasing the fiber in your diet, taking supplements, or taking medicines. This information is not intended to replace advice given to you by your health care provider. Make sure you discuss any questions you have with your health care provider. Document Revised: 05/30/2019 Document Reviewed: 05/30/2019 Elsevier Patient Education  [place].   Hemorrhoids Hemorrhoids are swollen veins in and around the rectum or anus. There are two types of  hemorrhoids:  Internal hemorrhoids. These occur in the veins that are just inside the rectum. They may poke through to the outside and become irritated and painful.  External hemorrhoids. These occur in the veins that are outside the anus and can be felt as a painful swelling or hard lump near the anus. Most hemorrhoids do not cause serious problems, and they can be managed with home treatments such as diet and lifestyle changes. If home treatments do not help the symptoms, procedures can be done to shrink or remove the hemorrhoids. What are the causes? This condition is caused by increased pressure in the anal area. This pressure may result from various things, including:  Constipation.  Straining to have a bowel movement.  Diarrhea.  Pregnancy.  Obesity.  Sitting for long  periods of time.  Heavy lifting or other activity that causes you to strain.  Anal sex.  Riding a bike for a long period of time. What are the signs or symptoms? Symptoms of this condition include:  Pain.  Anal itching or irritation.  Rectal bleeding.  Leakage of stool (feces).  Anal swelling.  One or more lumps around the anus. How is this diagnosed? This condition can often be diagnosed through a visual exam. Other exams or tests may also be done, such as:  An exam that involves feeling the rectal area with a gloved hand (digital rectal exam).  An exam of the anal canal that is done using a small tube (anoscope).  A blood test, if you have lost a significant amount of blood.  A test to look inside the colon using a flexible tube with a camera on the end (sigmoidoscopy or colonoscopy). How is this treated? This condition can usually be treated at home. However, various procedures may be done if dietary changes, lifestyle changes, and other home treatments do not help your symptoms. These procedures can help make the hemorrhoids smaller or remove them completely. Some of these procedures involve surgery, and others do not. Common procedures include:  Rubber band ligation. Rubber bands are placed at the base of the hemorrhoids to cut off their blood supply.  Sclerotherapy. Medicine is injected into the hemorrhoids to shrink them.  Infrared coagulation. A type of light energy is used to get rid of the hemorrhoids.  Hemorrhoidectomy surgery. The hemorrhoids are surgically removed, and the veins that supply them are tied off.  Stapled hemorrhoidopexy surgery. The surgeon staples the base of the hemorrhoid to the rectal wall. Follow these instructions at home: Eating and drinking   Eat foods that have a lot of fiber in them, such as whole grains, beans, nuts, fruits, and vegetables.  Ask your health care provider about taking products that have added fiber (fiber  supplements).  Reduce the amount of fat in your diet. You can do this by eating low-fat dairy products, eating less red meat, and avoiding processed foods.  Drink enough fluid to keep your urine pale yellow. Managing pain and swelling   Take warm sitz baths for 20 minutes, 3-4 times a day to ease pain and discomfort. You may do this in a bathtub or using a portable sitz bath that fits over the toilet.  If directed, apply ice to the affected area. Using ice packs between sitz baths may be helpful. ? Put ice in a plastic bag. ? Place a towel between your skin and the bag. ? Leave the ice on for 20 minutes, 2-3 times a day. General instructions  Take over-the-counter and  prescription medicines only as told by your health care provider.  Use medicated creams or suppositories as told.  Get regular exercise. Ask your health care provider how much and what kind of exercise is best for you. In general, you should do moderate exercise for at least 30 minutes on most days of the week (150 minutes each week). This can include activities such as walking, biking, or yoga.  Go to the bathroom when you have the urge to have a bowel movement. Do not wait.  Avoid straining to have bowel movements.  Keep the anal area dry and clean. Use wet toilet paper or moist towelettes after a bowel movement.  Do not sit on the toilet for long periods of time. This increases blood pooling and pain.  Keep all follow-up visits as told by your health care provider. This is important. Contact a health care provider if you have:  Increasing pain and swelling that are not controlled by treatment or medicine.  Difficulty having a bowel movement, or you are unable to have a bowel movement.  Pain or inflammation outside the area of the hemorrhoids. Get help right away if you have:  Uncontrolled bleeding from your rectum. Summary  Hemorrhoids are swollen veins in and around the rectum or anus.  Most hemorrhoids  can be managed with home treatments such as diet and lifestyle changes.  Taking warm sitz baths can help ease pain and discomfort.  In severe cases, procedures or surgery can be done to shrink or remove the hemorrhoids. This information is not intended to replace advice given to you by your health care provider. Make sure you discuss any questions you have with your health care provider. Document Revised: 03/29/2019 Document Reviewed: 03/22/2018 Elsevier Patient Education  [place].

## 2020-04-03 NOTE — H&P (Signed)
@LOGO @   Primary Care Physician:  Caryl Bis, MD Primary Gastroenterologist:  Dr. Gala Romney  Pre-Procedure History & Physical: HPI:  Brittany Shields is a 69 y.o. female here for further evaluation of rectal bleeding via colonoscopy.  Rectal bleeding is actually worsened somewhat in spite of constipation being effectively managed.  Past Medical History:  Diagnosis Date  . Arthritis   . Chronic sinusitis   . Generalized headaches   . GERD (gastroesophageal reflux disease)   . Hypothyroidism     Past Surgical History:  Procedure Laterality Date  . ANTERIOR CERVICAL DECOMP/DISCECTOMY FUSION N/A 08/28/2017   Procedure: ANTERIOR CERVICAL DECOMPRESSION AND FUSION CERVICAL FIVE-SIX,CERVICAL SIX-SEVEN;  Surgeon: Earnie Larsson, MD;  Location: [facility];  Service: Neurosurgery;  Laterality: N/A;  anterior  . BIOPSY  01/24/2020   Procedure: BIOPSY;  Surgeon: Daneil Dolin, MD;  [facility];  Service: Endoscopy;;  gastric polyp; gastric mucosa  . CARPAL TUNNEL RELEASE Right 2001  . CATARACT EXTRACTION W/PHACO Right 08/08/2017   Procedure: CATARACT EXTRACTION PHACO AND INTRAOCULAR LENS PLACEMENT (IOC);  Surgeon: Rutherford Guys, MD;  Location: [facility];  Service: Ophthalmology;  Laterality: Right;  CDE: 4.18  . CATARACT EXTRACTION W/PHACO Left 11/10/2017   Procedure: CATARACT EXTRACTION PHACO AND INTRAOCULAR LENS PLACEMENT (IOC);  Surgeon: Rutherford Guys, MD;  Location: [facility];  Service: Ophthalmology;  Laterality: Left;  CDE: 3.27  . CHOLECYSTECTOMY    . COLONOSCOPY    . CYST EXCISION     right eye  . ESOPHAGOGASTRODUODENOSCOPY N/A 01/24/2020   Procedure: ESOPHAGOGASTRODUODENOSCOPY (EGD);  Surgeon: Daneil Dolin, MD;  [facility];  Service: Endoscopy;  Laterality: N/A;  2:00pm  . EYE SURGERY    . HAMMER TOE SURGERY Bilateral   . NASAL/SINUS ENDOSCOPY    . TUBAL LIGATION    . tubal reversal  1991    Prior to Admission medications   Medication Sig Start Date End Date  Taking? Authorizing Provider  acetaminophen (TYLENOL) 500 MG tablet Take 1,000 mg by mouth every 6 (six) hours as needed for moderate pain or headache.   Yes [provider]  aspirin-acetaminophen-caffeine (EXCEDRIN MIGRAINE) [phone] MG tablet Take 1 tablet by mouth every 6 (six) hours as needed for headache or migraine.   Yes [provider]  Calcium Carb-Cholecalciferol (CALCIUM 600+D3 PO) Take 2 tablets by mouth every morning.    Yes [provider]  Carboxymethylcellulose Sodium (REFRESH TEARS OP) Place 1 application into both eyes as needed (dry eyes).    Yes [provider]  docusate sodium (COLACE) 100 MG capsule Take 200 mg by mouth at bedtime.   Yes [provider]  doxycycline (VIBRAMYCIN) 50 MG capsule Take 50 mg by mouth at bedtime.  08/21/19 08/20/20 Yes [provider]  estradiol (ESTRACE) 1 MG tablet Take 1 mg by mouth at bedtime.   Yes [provider]  Hypertonic Nasal Wash (SINUS RINSE NA) Place 1 each into the nose 2 (two) times daily.   Yes [provider]  levothyroxine (SYNTHROID, LEVOTHROID) 50 MCG tablet Take 50 mcg by mouth daily before breakfast.   Yes [provider]  omeprazole (PRILOSEC) 40 MG capsule Take 1 capsule (40 mg total) by mouth 2 (two) times daily. 02/21/20  Yes Erenest Rasher, PA-C  predniSONE (DELTASONE) 5 MG tablet Take 5 mg by mouth daily with breakfast.   Yes [provider]  Probiotic Product (PROBIOTIC DAILY) CAPS Take 1 capsule by mouth daily.   Yes [provider]  progesterone (PROMETRIUM) 100 MG capsule Take 100 mg by mouth at bedtime.    Yes [provider]  venlafaxine XR (EFFEXOR-XR) 75 MG 24 hr capsule Take 75 mg by mouth at bedtime.   Yes [provider]  ondansetron (ZOFRAN) 4 MG tablet Take 1 tablet (4 mg total) by mouth every 8 (eight) hours as needed for nausea or vomiting. 11/21/19   Carlis Stable, NP    Allergies as of  02/21/2020 - Review Complete 02/21/2020  Allergen Reaction Noted  . Zithromax [azithromycin] Hives and Rash 08/02/2017  . Lifitegrast Itching 05/25/2018    Family History  Problem Relation Age of Onset  . Other Mother   . Other Father   . Colon cancer Neg Hx     Social History   Socioeconomic History  . Marital status: Married    Spouse name: Not on file  . Number of children: Not on file  . Years of education: Not on file  . Highest education level: Not on file  Occupational History  . Not on file  Tobacco Use  . Smoking status: Never Smoker  . Smokeless tobacco: Never Used  Substance and Sexual Activity  . Alcohol use: No  . Drug use: No  . Sexual activity: Never  Other Topics Concern  . Not on file  Social History Narrative  . Not on file   Social Determinants of Health   Financial Resource Strain:   . Difficulty of Paying Living Expenses:   Food Insecurity:   . Worried About Charity fundraiser in the Last Year:   . Arboriculturist in the Last Year:   Transportation Needs:   . Film/video editor (Medical):   Marland Kitchen Lack of Transportation (Non-Medical):   Physical Activity:   . Days of Exercise per Week:   . Minutes of Exercise per Session:   Stress:   . Feeling of Stress :   Social Connections:   . Frequency of Communication with Friends and Family:   . Frequency of Social Gatherings with Friends and Family:   . Attends Religious Services:   . Active Member of Clubs or Organizations:   . Attends Archivist Meetings:   Marland Kitchen Marital Status:   Intimate Partner Violence:   . Fear of Current or Ex-Partner:   . Emotionally Abused:   Marland Kitchen Physically Abused:   . Sexually Abused:     Review of Systems: See HPI, otherwise negative ROS  Physical Exam: BP (!) 150/96   Pulse 100   Temp 98.2 F (36.8 C) (Oral)   Resp 20   Ht 5\' 3"  (1.6 m)   Wt 67.1 kg   SpO2 96%   BMI 26.22 kg/m  General:   Alert,  Well-developed, well-nourished, pleasant and  cooperative in NAD Neck:  Supple; no masses or thyromegaly. No significant cervical adenopathy. Lungs:  Clear throughout to auscultation.   No wheezes, crackles, or rhonchi. No acute distress. Heart:  Regular rate and rhythm; no murmurs, clicks, rubs,  or gallops. Abdomen: Non-distended, normal bowel sounds.  Soft and nontender without appreciable mass or hepatosplenomegaly.  Pulses:  Normal pulses noted. Extremities:  Without clubbing or edema.  Impression/Plan: 69 year old lady with intermittent hematochezia.  Here for diagnostic colonoscopy per plan.  The risks, benefits, limitations, alternatives and imponderables have been reviewed with the patient. Questions have been answered. All parties are agreeable.      Notice: This dictation was prepared with Dragon dictation along  with smaller phrase technology. Any transcriptional errors that result from this process are unintentional and may not be corrected upon review.

## 2020-04-07 ENCOUNTER — Encounter (INDEPENDENT_AMBULATORY_CARE_PROVIDER_SITE_OTHER): Payer: Medicare Other | Admitting: Ophthalmology

## 2020-04-14 ENCOUNTER — Ambulatory Visit: Payer: Medicare Other | Admitting: Nurse Practitioner

## 2020-04-27 DIAGNOSIS — G43909 Migraine, unspecified, not intractable, without status migrainosus: Secondary | ICD-10-CM | POA: Diagnosis not present

## 2020-04-27 DIAGNOSIS — F331 Major depressive disorder, recurrent, moderate: Secondary | ICD-10-CM | POA: Diagnosis not present

## 2020-04-27 DIAGNOSIS — Z6825 Body mass index (BMI) 25.0-25.9, adult: Secondary | ICD-10-CM | POA: Diagnosis not present

## 2020-04-28 ENCOUNTER — Other Ambulatory Visit: Payer: Self-pay

## 2020-04-28 ENCOUNTER — Ambulatory Visit (INDEPENDENT_AMBULATORY_CARE_PROVIDER_SITE_OTHER): Payer: Medicare Other | Admitting: Ophthalmology

## 2020-04-28 ENCOUNTER — Encounter (INDEPENDENT_AMBULATORY_CARE_PROVIDER_SITE_OTHER): Payer: Self-pay | Admitting: Ophthalmology

## 2020-04-28 DIAGNOSIS — H35041 Retinal micro-aneurysms, unspecified, right eye: Secondary | ICD-10-CM

## 2020-04-28 DIAGNOSIS — H3561 Retinal hemorrhage, right eye: Secondary | ICD-10-CM

## 2020-04-28 DIAGNOSIS — H35372 Puckering of macula, left eye: Secondary | ICD-10-CM | POA: Diagnosis not present

## 2020-04-28 DIAGNOSIS — H348112 Central retinal vein occlusion, right eye, stable: Secondary | ICD-10-CM | POA: Diagnosis not present

## 2020-04-28 NOTE — Assessment & Plan Note (Signed)
Condition is quiesced sent and is been off therapy for now 3 years will observe

## 2020-04-28 NOTE — Progress Notes (Signed)
04/28/2020     CHIEF COMPLAINT Patient presents for Retina Follow Up   HISTORY OF PRESENT ILLNESS: Brittany Shields is a 69 y.o. female who presents to the clinic today for:   HPI    Retina Follow Up    Patient presents with  CRVO/BRVO.  In right eye.  This started 1 year ago.  Severity is mild.  Duration of 1 year.  Since onset it is stable.          Comments    1 Year F/U OU  Pt denies noticeable changes to [place] OU since last visit. Pt denies ocular pain, flashes of light, or floaters OU. Pt c/o dryness off and on OU.        Last edited by Rockie Neighbours, [place] on 04/28/2020  1:48 PM. (History)      Referring physician: Caryl Bis, MD [place],  [place] [id number]  HISTORICAL INFORMATION:   Selected notes from the MEDICAL RECORD NUMBER       CURRENT MEDICATIONS: Current Outpatient Medications (Ophthalmic Drugs)  Medication Sig  . Carboxymethylcellulose Sodium (REFRESH TEARS OP) Place 1 application into both eyes as needed (dry eyes).    No current facility-administered medications for this visit. (Ophthalmic Drugs)   Current Outpatient Medications (Other)  Medication Sig  . acetaminophen (TYLENOL) 500 MG tablet Take 1,000 mg by mouth every 6 (six) hours as needed for moderate pain or headache.  Marland Kitchen aspirin-acetaminophen-caffeine (EXCEDRIN MIGRAINE) 250-250-65 MG tablet Take 1 tablet by mouth every 6 (six) hours as needed for headache or migraine.  . Calcium Carb-Cholecalciferol (CALCIUM 600+D3 PO) Take 2 tablets by mouth every morning.   . docusate sodium (COLACE) 100 MG capsule Take 200 mg by mouth at bedtime.  Marland Kitchen doxycycline (VIBRAMYCIN) 50 MG capsule Take 50 mg by mouth at bedtime.   Marland Kitchen estradiol (ESTRACE) 1 MG tablet Take 1 mg by mouth at bedtime.  . Hypertonic Nasal Wash (SINUS RINSE NA) Place 1 each into the nose 2 (two) times daily.  Marland Kitchen levothyroxine (SYNTHROID, LEVOTHROID) 50 MCG tablet Take 50 mcg by mouth daily before breakfast.  . omeprazole  (PRILOSEC) 40 MG capsule Take 1 capsule (40 mg total) by mouth 2 (two) times daily.  . ondansetron (ZOFRAN) 4 MG tablet Take 1 tablet (4 mg total) by mouth every 8 (eight) hours as needed for nausea or vomiting.  . predniSONE (DELTASONE) 5 MG tablet Take 5 mg by mouth daily with breakfast.  . Probiotic Product (PROBIOTIC DAILY) CAPS Take 1 capsule by mouth daily.  . progesterone (PROMETRIUM) 100 MG capsule Take 100 mg by mouth at bedtime.   Marland Kitchen venlafaxine XR (EFFEXOR-XR) 75 MG 24 hr capsule Take 75 mg by mouth at bedtime.   No current facility-administered medications for this visit. (Other)      REVIEW OF SYSTEMS:    ALLERGIES Allergies  Allergen Reactions  . Zithromax [Azithromycin] Hives and Rash  . Lifitegrast Itching    Burning pain Shirley Friar    PAST MEDICAL HISTORY Past Medical History:  Diagnosis Date  . Arthritis   . Chronic sinusitis   . Generalized headaches   . GERD (gastroesophageal reflux disease)   . Hypothyroidism    Past Surgical History:  Procedure Laterality Date  . ANTERIOR CERVICAL DECOMP/DISCECTOMY FUSION N/A 08/28/2017   Procedure: ANTERIOR CERVICAL DECOMPRESSION AND FUSION CERVICAL FIVE-SIX,CERVICAL SIX-SEVEN;  Surgeon: Earnie Larsson, MD;  Location: [facility];  Service: Neurosurgery;  Laterality: N/A;  anterior  . BIOPSY  01/24/2020  Procedure: BIOPSY;  Surgeon: Daneil Dolin, MD;  [facility];  Service: Endoscopy;;  gastric polyp; gastric mucosa  . CARPAL TUNNEL RELEASE Right 2001  . CATARACT EXTRACTION W/PHACO Right 08/08/2017   Procedure: CATARACT EXTRACTION PHACO AND INTRAOCULAR LENS PLACEMENT (IOC);  Surgeon: Rutherford Guys, MD;  [facility];  Service: Ophthalmology;  Laterality: Right;  CDE: 4.18  . CATARACT EXTRACTION W/PHACO Left 11/10/2017   Procedure: CATARACT EXTRACTION PHACO AND INTRAOCULAR LENS PLACEMENT (IOC);  Surgeon: Rutherford Guys, MD;  [facility];  Service: Ophthalmology;  Laterality: Left;  CDE: 3.27  .  CHOLECYSTECTOMY    . COLONOSCOPY    . COLONOSCOPY N/A 04/03/2020   Procedure: COLONOSCOPY;  Surgeon: Daneil Dolin, MD;  [facility];  Service: Endoscopy;  Laterality: N/A;  10:00am  . CYST EXCISION     right eye  . ESOPHAGOGASTRODUODENOSCOPY N/A 01/24/2020   Procedure: ESOPHAGOGASTRODUODENOSCOPY (EGD);  Surgeon: Daneil Dolin, MD;  [facility];  Service: Endoscopy;  Laterality: N/A;  2:00pm  . EYE SURGERY    . HAMMER TOE SURGERY Bilateral   . NASAL/SINUS ENDOSCOPY    . TUBAL LIGATION    . tubal reversal  1991    FAMILY HISTORY Family History  Problem Relation Age of Onset  . Other Mother   . Other Father   . Colon cancer Neg Hx     SOCIAL HISTORY Social History   Tobacco Use  . Smoking status: Never Smoker  . Smokeless tobacco: Never Used  Vaping Use  . Vaping Use: Never used  Substance Use Topics  . Alcohol use: No  . Drug use: No         OPHTHALMIC EXAM:  Base Eye Exam    Visual Acuity (ETDRS)      Right Left   Dist cc 20/40 -2 20/25 -1   Dist ph cc 20/30 +1    Correction: Glasses       Tonometry (Tonopen, 1:53 PM)      Right Left   Pressure 11 09       Pupils      Pupils Dark Light Shape React APD   Right PERRL 4 3 Round Brisk None   Left PERRL 4 3 Round Brisk None       Visual Fields (Counting fingers)      Left Right    Full Full       Extraocular Movement      Right Left    Full Full       Neuro/Psych    Oriented x3: Yes   Mood/Affect: Normal       Dilation    Both eyes: 1.0% Mydriacyl, 2.5% Phenylephrine @ 1:53 PM        Slit Lamp and Fundus Exam    External Exam      Right Left   External Normal Normal       Slit Lamp Exam      Right Left   Lids/Lashes Normal Normal   Conjunctiva/Sclera White and quiet White and quiet   Cornea Clear Clear   Anterior Chamber Deep and quiet Deep and quiet   Iris Round and reactive Round and reactive   Lens Centered posterior chamber intraocular lens Centered  posterior chamber intraocular lens   Anterior Vitreous Normal Normal       Fundus Exam      Right Left   Posterior Vitreous Normal Normal   Disc Normal Normal   C/D  Ratio 0.15 0.15   Macula Normal Normal   Vessels Old CRVO, OD, COMPENSATED,,,    Normal   Periphery Normal Normal          IMAGING AND PROCEDURES  Imaging and Procedures for 04/28/20  Color Fundus Photography Optos - OU - Both Eyes       Right Eye Progression has been stable. Disc findings include normal observations. Macula : normal observations. Periphery : normal observations.   Left Eye Progression has improved. Disc findings include normal observations. Periphery : normal observations.   Notes Retinal vasculature now with normal caliber veins OD OS with mild cellophane maculopathy appearance temporal to the fovea with no topographic distortion.  Posterior vitreous detachment is visualized.                ASSESSMENT/PLAN:  Central retinal vein occlusion of right eye Condition is quiesced sent and is been off therapy for now 3 years will observe      ICD-10-CM   1. Stable central retinal vein occlusion of right eye  H34.8112 Color Fundus Photography Optos - OU - Both Eyes  2. Retinal hemorrhage of right eye  H35.61   3. Retinal microaneurysm of right eye  H35.041   4. Macular pucker, left eye  H35.372     1.  2.  3.  Ophthalmic Meds Ordered this visit:  No orders of the defined types were placed in this encounter.      Return in about 1 year (around 04/28/2021) for DILATE OU, OCT.  There are no Patient Instructions on file for this visit.   Explained the diagnoses, plan, and follow up with the patient and they expressed understanding.  Patient expressed understanding of the importance of proper follow up care.   Clent Demark Ilayda Toda M.D. Diseases & Surgery of the Retina and Vitreous [facility] 04/28/20     Abbreviations: M myopia (nearsighted); A astigmatism;  H hyperopia (farsighted); P presbyopia; Mrx spectacle prescription;  CTL contact lenses; OD right eye; OS left eye; OU both eyes  XT exotropia; ET esotropia; PEK punctate epithelial keratitis; PEE punctate epithelial erosions; DES dry eye syndrome; MGD meibomian gland dysfunction; ATs artificial tears; PFAT's preservative free artificial tears; [place] nuclear sclerotic cataract; PSC posterior subcapsular cataract; ERM epi-retinal membrane; PVD posterior vitreous detachment; RD retinal detachment; DM diabetes mellitus; DR diabetic retinopathy; NPDR non-proliferative diabetic retinopathy; PDR proliferative diabetic retinopathy; CSME clinically significant macular edema; DME diabetic macular edema; dbh dot blot hemorrhages; CWS cotton wool spot; POAG primary open angle glaucoma; C/D cup-to-disc ratio; HVF humphrey visual field; GVF goldmann visual field; OCT optical coherence tomography; IOP intraocular pressure; BRVO Branch retinal vein occlusion; CRVO central retinal vein occlusion; CRAO central retinal artery occlusion; BRAO branch retinal artery occlusion; RT retinal tear; SB scleral buckle; PPV pars plana vitrectomy; VH Vitreous hemorrhage; PRP panretinal laser photocoagulation; IVK intravitreal kenalog; VMT vitreomacular traction; MH Macular hole;  NVD neovascularization of the disc; NVE neovascularization elsewhere; AREDS age related eye disease study; ARMD age related macular degeneration; POAG primary open angle glaucoma; EBMD epithelial/anterior basement membrane dystrophy; ACIOL anterior chamber intraocular lens; IOL intraocular lens; PCIOL posterior chamber intraocular lens; Phaco/IOL phacoemulsification with intraocular lens placement; [place] photorefractive keratectomy; LASIK laser assisted in situ keratomileusis; HTN hypertension; DM diabetes mellitus; COPD chronic obstructive pulmonary disease

## 2020-05-21 ENCOUNTER — Ambulatory Visit (INDEPENDENT_AMBULATORY_CARE_PROVIDER_SITE_OTHER): Payer: Medicare Other | Admitting: Nurse Practitioner

## 2020-05-21 ENCOUNTER — Other Ambulatory Visit: Payer: Self-pay

## 2020-05-21 ENCOUNTER — Encounter: Payer: Self-pay | Admitting: Nurse Practitioner

## 2020-05-21 VITALS — BP 157/87 | HR 88 | Temp 97.3°F | Ht 63.0 in | Wt 153.0 lb

## 2020-05-21 DIAGNOSIS — K219 Gastro-esophageal reflux disease without esophagitis: Secondary | ICD-10-CM

## 2020-05-21 DIAGNOSIS — K625 Hemorrhage of anus and rectum: Secondary | ICD-10-CM

## 2020-05-21 MED ORDER — OMEPRAZOLE 40 MG PO CPDR: 40.0000 mg | DELAYED_RELEASE_CAPSULE | Freq: Two times a day (BID) | ORAL | 3 refills | Status: DC

## 2020-05-21 MED ORDER — HYDROCORTISONE (PERIANAL) 2.5 % EX CREA: 1.0000 "application " | TOPICAL_CREAM | Freq: Two times a day (BID) | CUTANEOUS | 1 refills | Status: DC | PRN

## 2020-05-21 NOTE — Patient Instructions (Signed)
Your health issues we discussed today were:   GERD (reflux/heartburn): 1. I am glad you are doing better! 2. Continue taking your acid blocker (omeprazole) 3. I have sent a refill to your [facility] pharmacy. 4. Call us if you have any worsening or severe symptoms  Rectal bleeding: 1. I sent a prescription for Anusol rectal cream to your pharmacy.  I would apply this after your daily bowel movement to help with your hemorrhoid symptoms 2. We will schedule an appointment with Roseanne Kaufman, NP for an office hemorrhoid banding to help manage her symptoms 3. Call us if you have any worsening or severe bleeding  Overall I recommend:  1. Continue your other current medications 2. Return for follow-up based on recommendations made after hemorrhoid banding 3. Call us if you have any questions or concerns   ---------------------------------------------------------------  I am glad you have gotten your COVID-19 vaccination!  Even though you are fully vaccinated you should continue to follow CDC and state/local guidelines.  ---------------------------------------------------------------   At [facility] we value your feedback. You may receive a survey about your visit today. Please share your experience as we strive to create trusting relationships with our patients to provide genuine, compassionate, quality care.  We appreciate your understanding and patience as we review any laboratory studies, imaging, and other diagnostic tests that are ordered as we care for you. Our office policy is 5 business days for review of these results, and any emergent or urgent results are addressed in a timely manner for your best interest. If you do not hear from our office in 1 week, please contact us.   We also encourage the use of MyChart, which contains your medical information for your review as well. If you are not enrolled in this feature, an access code is on this after visit summary for  your convenience. Thank you for allowing [place] to be involved in your care.  It was great to see you today!  I hope you have a great Summer!!

## 2020-05-21 NOTE — Progress Notes (Signed)
Referring Provider: Caryl Bis, MD Primary Care Physician:  Caryl Bis, MD Primary GI:  Dr. Gala Romney  Chief Complaint  Patient presents with   Gastroesophageal Reflux    doing better   Rectal Bleeding    still ongoing    HPI:   Brittany Shields is a 69 y.o. female who presents for 6-week follow-up related to GERD and nausea.  The patient was last seen in our office 02/21/2020 for GERD and rectal bleeding.  EGD on file 01/24/2020 with normal esophagus, multiple gastric polyp status post biopsy, gastric erosions status post biopsy, normal duodenum.  Surgical pathology found fundic gland polyp and reactive gastropathy with erosion, no H. Pylori.  At her last visit noted intermittent rectal bleeding with bowel movements for the past year which progressed to daily for the past 3 months.  Bright red, toilet tissue, and in the commode.  Worsened rectal bleeding with an episode of constipation 2 months prior.  GERD doing well on PPI, nausea/vomiting has resolved.  Hemorrhoid flare after constipation which was resolved with Preparation H.  Recommended labs, colonoscopy, Preparation H, continue current meds.  Labs completed 02/21/2020 found normal hemoglobin at 14.1.  Colonoscopy completed 04/03/2020 found diverticulosis in the entire colon, nonbleeding external and internal hemorrhoids, otherwise normal.  Suspect bleeding from hemorrhoids.  Recommended begin Benefiber, consider hemorrhoid banding, no repeat colonoscopy due to age, follow-up in 6 weeks.  Today she states she's doing ok overall. She has been using Preparation H which isn't helping a whole lot. Still with hematochezia every bowel movement, on the stool/in the commode and on the tissue; has to wear a pad dur to persistent bleeding after the bowel movement. On stool softeners x 2 every evening, not constipated. GERD doing well on PPI, no real breakthrough recently. Needs a refill on her PPI. N/V resolved with GERD improvement. Denies  abdominal pain, melena, fever, chills, unintentional weight loss. Denies URI or flu-like symptoms. Denies loss of sense of taste or smell. The patient has received COVID-19 vaccination(s). Denies chest pain, dyspnea, dizziness, lightheadedness, syncope, near syncope. Denies any other upper or lower GI symptoms.  Past Medical History:  Diagnosis Date   Arthritis    Chronic sinusitis    Generalized headaches    GERD (gastroesophageal reflux disease)    Hypothyroidism     Past Surgical History:  Procedure Laterality Date   ANTERIOR CERVICAL DECOMP/DISCECTOMY FUSION N/A 08/28/2017   Procedure: ANTERIOR CERVICAL DECOMPRESSION AND FUSION CERVICAL FIVE-SIX,CERVICAL SIX-SEVEN;  Surgeon: Earnie Larsson, MD;  Location: [facility];  Service: Neurosurgery;  Laterality: N/A;  anterior   BIOPSY  01/24/2020   Procedure: BIOPSY;  Surgeon: Daneil Dolin, MD;  [facility];  Service: Endoscopy;;  gastric polyp; gastric mucosa   CARPAL TUNNEL RELEASE Right 2001   CATARACT EXTRACTION W/PHACO Right 08/08/2017   Procedure: CATARACT EXTRACTION PHACO AND INTRAOCULAR LENS PLACEMENT ([place]);  Surgeon: Rutherford Guys, MD;  [facility];  Service: Ophthalmology;  Laterality: Right;  CDE: 4.18   CATARACT EXTRACTION W/PHACO Left 11/10/2017   Procedure: CATARACT EXTRACTION PHACO AND INTRAOCULAR LENS PLACEMENT ([facility]);  Surgeon: Rutherford Guys, MD;  [facility];  Service: Ophthalmology;  Laterality: Left;  CDE: 3.27   CHOLECYSTECTOMY     COLONOSCOPY     COLONOSCOPY N/A 04/03/2020   Procedure: COLONOSCOPY;  Surgeon: Daneil Dolin, MD;  [facility];  Service: Endoscopy;  Laterality: N/A;  10:00am   CYST EXCISION     right eye  ESOPHAGOGASTRODUODENOSCOPY N/A 01/24/2020   Procedure: ESOPHAGOGASTRODUODENOSCOPY (EGD);  Surgeon: Daneil Dolin, MD;  [facility];  Service: Endoscopy;  Laterality: N/A;  2:00pm   EYE SURGERY     HAMMER TOE SURGERY Bilateral    NASAL/SINUS  ENDOSCOPY     TUBAL LIGATION     tubal reversal  1991    Current Outpatient Medications  Medication Sig Dispense Refill   acetaminophen (TYLENOL) 500 MG tablet Take 1,000 mg by mouth every 6 (six) hours as needed for moderate pain or headache.     aspirin-acetaminophen-caffeine (EXCEDRIN MIGRAINE) 250-250-65 MG tablet Take 1 tablet by mouth every 6 (six) hours as needed for headache or migraine.     Calcium Carb-Cholecalciferol (CALCIUM 600+D3 PO) Take 2 tablets by mouth every morning.      Carboxymethylcellulose Sodium (REFRESH TEARS OP) Place 1 application into both eyes as needed (dry eyes).      docusate sodium (COLACE) 100 MG capsule Take 200 mg by mouth at bedtime.     doxycycline (VIBRAMYCIN) 50 MG capsule Take 50 mg by mouth at bedtime.      estradiol (ESTRACE) 1 MG tablet Take 1 mg by mouth at bedtime.     Hypertonic Nasal Wash (SINUS RINSE NA) Place 1 each into the nose 2 (two) times daily.     levothyroxine (SYNTHROID, LEVOTHROID) 50 MCG tablet Take 50 mcg by mouth daily before breakfast.     omeprazole (PRILOSEC) 40 MG capsule Take 1 capsule (40 mg total) by mouth 2 (two) times daily. 180 capsule 3   ondansetron (ZOFRAN) 4 MG tablet Take 1 tablet (4 mg total) by mouth every 8 (eight) hours as needed for nausea or vomiting. 30 tablet 1   predniSONE (DELTASONE) 5 MG tablet Take 5 mg by mouth daily with breakfast.     Probiotic Product (PROBIOTIC DAILY) CAPS Take 1 capsule by mouth daily.     progesterone (PROMETRIUM) 100 MG capsule Take 100 mg by mouth at bedtime.      venlafaxine XR (EFFEXOR-XR) 75 MG 24 hr capsule Take 75 mg by mouth at bedtime.     No current facility-administered medications for this visit.    Allergies as of 05/21/2020 - Review Complete 05/21/2020  Allergen Reaction Noted   Zithromax [azithromycin] Hives and Rash 08/02/2017   Lifitegrast Itching 05/25/2018    Family History  Problem Relation Age of Onset   Other Mother    Other  Father    Colon cancer Neg Hx     Social History   Socioeconomic History   Marital status: Married    Spouse name: Not on file   Number of children: Not on file   Years of education: Not on file   Highest education level: Not on file  Occupational History   Not on file  Tobacco Use   Smoking status: Never Smoker   Smokeless tobacco: Never Used  Vaping Use   Vaping Use: Never used  Substance and Sexual Activity   Alcohol use: No   Drug use: No   Sexual activity: Never  Other Topics Concern   Not on file  Social History Narrative   Not on file   Social Determinants of Health   Financial Resource Strain:    Difficulty of Paying Living Expenses:   Food Insecurity:    Worried About [place] in the Last Year:    [place] in the Last Year:   Transportation Needs:    Lack  of Transportation (Medical):    Lack of Transportation (Non-Medical):   Physical Activity:    Days of Exercise per Week:    Minutes of Exercise per Session:   Stress:    Feeling of Stress :   Social Connections:    Frequency of Communication with Friends and Family:    Frequency of Social Gatherings with Friends and Family:    Attends Religious Services:    Active Member of Clubs or Organizations:    Attends Music therapist:    Marital Status:     Subjective: Review of Systems  Constitutional: Negative for chills, fever, malaise/fatigue and weight loss.  HENT: Negative for congestion and sore throat.   Respiratory: Negative for cough and shortness of breath.   Cardiovascular: Negative for chest pain and palpitations.  Gastrointestinal: Positive for blood in stool. Negative for abdominal pain, diarrhea, melena, nausea and vomiting.  Musculoskeletal: Negative for joint pain and myalgias.  Skin: Negative for rash.  Neurological: Negative for dizziness and weakness.  Endo/Heme/Allergies: Does not bruise/bleed easily.    Psychiatric/Behavioral: Negative for depression. The patient is not nervous/anxious.   All other systems reviewed and are negative.    Objective: BP (!) 157/87    Pulse 88    Temp (!) 97.3 F (36.3 C)    Ht 5\' 3"  (1.6 m)    Wt 153 lb (69.4 kg)    BMI 27.10 kg/m  Physical Exam Vitals and nursing note reviewed.  Constitutional:      General: She is not in acute distress.    Appearance: Normal appearance. She is well-developed and normal weight. She is not ill-appearing, toxic-appearing or diaphoretic.  HENT:     Head: Normocephalic and atraumatic.     Nose: No congestion or rhinorrhea.  Eyes:     General: No scleral icterus. Cardiovascular:     Rate and Rhythm: Normal rate and regular rhythm.     Heart sounds: Normal heart sounds.  Pulmonary:     Effort: Pulmonary effort is normal. No respiratory distress.     Breath sounds: Normal breath sounds.  Abdominal:     General: Bowel sounds are normal.     Palpations: Abdomen is soft. There is no hepatomegaly, splenomegaly or mass.     Tenderness: There is no abdominal tenderness. There is no guarding or rebound.     Hernia: No hernia is present.  Skin:    General: Skin is warm and dry.     Coloration: Skin is not jaundiced.     Findings: No rash.  Neurological:     General: No focal deficit present.     Mental Status: She is alert and oriented to person, place, and time.  Psychiatric:        Attention and Perception: Attention normal.        Mood and Affect: Mood normal.        Speech: Speech normal.        Behavior: Behavior normal.        Thought Content: Thought content normal.        Cognition and Memory: Cognition and memory normal.       05/21/2020 8:27 AM   Disclaimer: This note was dictated with voice recognition software. Similar sounding words can inadvertently be transcribed and may not be corrected upon review.

## 2020-05-21 NOTE — Assessment & Plan Note (Signed)
She does have persistent rectal bleeding on a daily basis, required to wear a pad to prevent post bowel movement bleeding for couple few hours after a bowel movement.  She is on stool softeners and Benefiber and has [place] 4 stools (per her description on the chart in the room).  She has tried Preparation H which did not help.  In the interim I will send in Anusol rectal cream to her pharmacy to help.  We will schedule her a banding appointment for an office hemorrhoid banding given her colonoscopy report indicating she appears to be a good hemorrhoid banding candidate.  Further recommendations to follow her banding.

## 2020-05-21 NOTE — Assessment & Plan Note (Signed)
GERD symptoms doing well on PPI.  Refill was provided.  Recommend she continue her current medications

## 2020-05-27 DIAGNOSIS — Z961 Presence of intraocular lens: Secondary | ICD-10-CM | POA: Diagnosis not present

## 2020-05-27 DIAGNOSIS — H0011 Chalazion right upper eyelid: Secondary | ICD-10-CM | POA: Diagnosis not present

## 2020-05-27 DIAGNOSIS — H02889 Meibomian gland dysfunction of unspecified eye, unspecified eyelid: Secondary | ICD-10-CM | POA: Diagnosis not present

## 2020-05-27 DIAGNOSIS — H04123 Dry eye syndrome of bilateral lacrimal glands: Secondary | ICD-10-CM | POA: Diagnosis not present

## 2020-05-28 ENCOUNTER — Ambulatory Visit: Payer: Medicare Other | Admitting: Gastroenterology

## 2020-06-12 DIAGNOSIS — M797 Fibromyalgia: Secondary | ICD-10-CM | POA: Diagnosis not present

## 2020-06-12 DIAGNOSIS — N183 Chronic kidney disease, stage 3 unspecified: Secondary | ICD-10-CM | POA: Diagnosis not present

## 2020-06-12 DIAGNOSIS — E039 Hypothyroidism, unspecified: Secondary | ICD-10-CM | POA: Diagnosis not present

## 2020-06-12 DIAGNOSIS — M1711 Unilateral primary osteoarthritis, right knee: Secondary | ICD-10-CM | POA: Diagnosis not present

## 2020-07-07 DIAGNOSIS — M720 Palmar fascial fibromatosis [Dupuytren]: Secondary | ICD-10-CM | POA: Diagnosis not present

## 2020-07-10 ENCOUNTER — Other Ambulatory Visit: Payer: Self-pay

## 2020-07-10 ENCOUNTER — Encounter: Payer: Self-pay | Admitting: Gastroenterology

## 2020-07-10 ENCOUNTER — Ambulatory Visit (INDEPENDENT_AMBULATORY_CARE_PROVIDER_SITE_OTHER): Payer: Medicare Other | Admitting: Gastroenterology

## 2020-07-10 VITALS — BP 139/86 | HR 100 | Temp 96.9°F | Ht 63.0 in | Wt 150.4 lb

## 2020-07-10 DIAGNOSIS — K641 Second degree hemorrhoids: Secondary | ICD-10-CM | POA: Diagnosis not present

## 2020-07-10 NOTE — Progress Notes (Signed)
[facility] Banding Note:   Very pleasant 69 year old female presenting today with symptomatic hemorrhoids, Grade 2 by description. Colonoscopy completed 04/03/2020 found diverticulosis in the entire colon, nonbleeding external and internal hemorrhoids, otherwise normal. Notes biggest issue is bleeding with each bowel movement, present for the past year. Takes Benefiber twice per day. Sometimes prolonged toilet time up to 10 minutes and straining.   The patient presents with symptomatic grade 2 hemorrhoids, unresponsive to maximal medical therapy, requesting rubber band ligation of her hemorrhoidal disease. All risks, benefits, and alternative forms of therapy were described and informed consent was obtained.  The decision was made to band the left lateral internal hemorrhoid, and the [place] was used to perform band ligation without complication. Digital anorectal examination was then performed to assure proper positioning of the band, and to adjust the banded tissue as required. The patient was discharged home without pain or other issues. Dietary and behavioral recommendations were given, along with follow-up instructions. The patient will return in 2-3 weeks for followup and possible additional banding as required. Increase Benefiber as needed and may take Miralax prn.   No complications were encountered and the patient tolerated the procedure well.  Annitta Needs, PhD, ANP-BC [facility]

## 2020-07-10 NOTE — Patient Instructions (Signed)
Continue fiber as you are doing. You can increase this to three times a day if needed!  You can take a capful of Miralax daily as needed with full glass of water; this is to help if you feel a little more constipated than normal.  We will see you back in 2-3 weeks for additional banding!  Avoid sitting on toilet for more than 2-3 minutes at a time, and we want to avoid straining as well.  It was a pleasure to see you today. I want to create trusting relationships with patients to provide genuine, compassionate, and quality care. I value your feedback. If you receive a survey regarding your visit,  I greatly appreciate you taking time to fill this out.   Annitta Needs, PhD, ANP-BC [facility]

## 2020-07-15 DIAGNOSIS — M25642 Stiffness of left hand, not elsewhere classified: Secondary | ICD-10-CM | POA: Diagnosis not present

## 2020-07-26 DIAGNOSIS — K922 Gastrointestinal hemorrhage, unspecified: Secondary | ICD-10-CM | POA: Diagnosis not present

## 2020-07-26 DIAGNOSIS — D649 Anemia, unspecified: Secondary | ICD-10-CM | POA: Diagnosis not present

## 2020-07-26 DIAGNOSIS — I708 Atherosclerosis of other arteries: Secondary | ICD-10-CM | POA: Diagnosis not present

## 2020-07-26 DIAGNOSIS — D259 Leiomyoma of uterus, unspecified: Secondary | ICD-10-CM | POA: Diagnosis not present

## 2020-07-26 DIAGNOSIS — I959 Hypotension, unspecified: Secondary | ICD-10-CM | POA: Diagnosis not present

## 2020-07-26 DIAGNOSIS — K5731 Diverticulosis of large intestine without perforation or abscess with bleeding: Secondary | ICD-10-CM | POA: Diagnosis not present

## 2020-07-26 DIAGNOSIS — Z800 Family history of malignant neoplasm of digestive organs: Secondary | ICD-10-CM | POA: Diagnosis not present

## 2020-07-26 DIAGNOSIS — Z79890 Hormone replacement therapy (postmenopausal): Secondary | ICD-10-CM | POA: Diagnosis not present

## 2020-07-26 DIAGNOSIS — K649 Unspecified hemorrhoids: Secondary | ICD-10-CM | POA: Diagnosis not present

## 2020-07-26 DIAGNOSIS — F331 Major depressive disorder, recurrent, moderate: Secondary | ICD-10-CM | POA: Diagnosis not present

## 2020-07-26 DIAGNOSIS — Z20822 Contact with and (suspected) exposure to covid-19: Secondary | ICD-10-CM | POA: Diagnosis not present

## 2020-07-26 DIAGNOSIS — M3130 Wegener's granulomatosis without renal involvement: Secondary | ICD-10-CM | POA: Diagnosis not present

## 2020-07-26 DIAGNOSIS — Z7982 Long term (current) use of aspirin: Secondary | ICD-10-CM | POA: Diagnosis not present

## 2020-07-26 DIAGNOSIS — R000 Tachycardia, unspecified: Secondary | ICD-10-CM | POA: Diagnosis not present

## 2020-07-26 DIAGNOSIS — K219 Gastro-esophageal reflux disease without esophagitis: Secondary | ICD-10-CM | POA: Diagnosis not present

## 2020-07-26 DIAGNOSIS — E063 Autoimmune thyroiditis: Secondary | ICD-10-CM | POA: Diagnosis not present

## 2020-07-26 DIAGNOSIS — Z79899 Other long term (current) drug therapy: Secondary | ICD-10-CM | POA: Diagnosis not present

## 2020-07-26 DIAGNOSIS — Z9049 Acquired absence of other specified parts of digestive tract: Secondary | ICD-10-CM | POA: Diagnosis not present

## 2020-07-26 DIAGNOSIS — H905 Unspecified sensorineural hearing loss: Secondary | ICD-10-CM | POA: Diagnosis not present

## 2020-07-26 DIAGNOSIS — I700 Atherosclerosis of aorta: Secondary | ICD-10-CM | POA: Diagnosis not present

## 2020-07-26 DIAGNOSIS — N2889 Other specified disorders of kidney and ureter: Secondary | ICD-10-CM | POA: Diagnosis not present

## 2020-07-26 DIAGNOSIS — R197 Diarrhea, unspecified: Secondary | ICD-10-CM | POA: Diagnosis not present

## 2020-07-26 DIAGNOSIS — G43909 Migraine, unspecified, not intractable, without status migrainosus: Secondary | ICD-10-CM | POA: Diagnosis not present

## 2020-07-26 DIAGNOSIS — K625 Hemorrhage of anus and rectum: Secondary | ICD-10-CM | POA: Diagnosis not present

## 2020-07-26 DIAGNOSIS — I701 Atherosclerosis of renal artery: Secondary | ICD-10-CM | POA: Diagnosis not present

## 2020-07-27 DIAGNOSIS — D62 Acute posthemorrhagic anemia: Secondary | ICD-10-CM | POA: Diagnosis not present

## 2020-07-27 DIAGNOSIS — K922 Gastrointestinal hemorrhage, unspecified: Secondary | ICD-10-CM | POA: Diagnosis not present

## 2020-08-03 DIAGNOSIS — N183 Chronic kidney disease, stage 3 unspecified: Secondary | ICD-10-CM | POA: Diagnosis not present

## 2020-08-03 DIAGNOSIS — B182 Chronic viral hepatitis C: Secondary | ICD-10-CM | POA: Diagnosis not present

## 2020-08-03 DIAGNOSIS — M797 Fibromyalgia: Secondary | ICD-10-CM | POA: Diagnosis not present

## 2020-08-03 DIAGNOSIS — K219 Gastro-esophageal reflux disease without esophagitis: Secondary | ICD-10-CM | POA: Diagnosis not present

## 2020-08-03 DIAGNOSIS — R739 Hyperglycemia, unspecified: Secondary | ICD-10-CM | POA: Diagnosis not present

## 2020-08-03 DIAGNOSIS — E782 Mixed hyperlipidemia: Secondary | ICD-10-CM | POA: Diagnosis not present

## 2020-08-03 DIAGNOSIS — E039 Hypothyroidism, unspecified: Secondary | ICD-10-CM | POA: Diagnosis not present

## 2020-08-07 DIAGNOSIS — Z6824 Body mass index (BMI) 24.0-24.9, adult: Secondary | ICD-10-CM | POA: Diagnosis not present

## 2020-08-07 DIAGNOSIS — K219 Gastro-esophageal reflux disease without esophagitis: Secondary | ICD-10-CM | POA: Diagnosis not present

## 2020-08-07 DIAGNOSIS — Z23 Encounter for immunization: Secondary | ICD-10-CM | POA: Diagnosis not present

## 2020-08-07 DIAGNOSIS — E782 Mixed hyperlipidemia: Secondary | ICD-10-CM | POA: Diagnosis not present

## 2020-08-07 DIAGNOSIS — Z0001 Encounter for general adult medical examination with abnormal findings: Secondary | ICD-10-CM | POA: Diagnosis not present

## 2020-08-07 DIAGNOSIS — M3130 Wegener's granulomatosis without renal involvement: Secondary | ICD-10-CM | POA: Diagnosis not present

## 2020-08-07 DIAGNOSIS — E039 Hypothyroidism, unspecified: Secondary | ICD-10-CM | POA: Diagnosis not present

## 2020-08-07 DIAGNOSIS — G43909 Migraine, unspecified, not intractable, without status migrainosus: Secondary | ICD-10-CM | POA: Diagnosis not present

## 2020-09-02 ENCOUNTER — Ambulatory Visit (INDEPENDENT_AMBULATORY_CARE_PROVIDER_SITE_OTHER): Payer: Medicare Other | Admitting: Gastroenterology

## 2020-09-02 ENCOUNTER — Other Ambulatory Visit: Payer: Self-pay

## 2020-09-02 ENCOUNTER — Encounter: Payer: Self-pay | Admitting: Gastroenterology

## 2020-09-02 VITALS — BP 138/84 | HR 104 | Temp 97.3°F | Ht 63.5 in | Wt 145.0 lb

## 2020-09-02 DIAGNOSIS — H04123 Dry eye syndrome of bilateral lacrimal glands: Secondary | ICD-10-CM | POA: Diagnosis not present

## 2020-09-02 DIAGNOSIS — H18419 Arcus senilis, unspecified eye: Secondary | ICD-10-CM | POA: Diagnosis not present

## 2020-09-02 DIAGNOSIS — K625 Hemorrhage of anus and rectum: Secondary | ICD-10-CM | POA: Diagnosis not present

## 2020-09-02 DIAGNOSIS — H0288B Meibomian gland dysfunction left eye, upper and lower eyelids: Secondary | ICD-10-CM | POA: Diagnosis not present

## 2020-09-02 DIAGNOSIS — Z961 Presence of intraocular lens: Secondary | ICD-10-CM | POA: Diagnosis not present

## 2020-09-02 DIAGNOSIS — K641 Second degree hemorrhoids: Secondary | ICD-10-CM | POA: Diagnosis not present

## 2020-09-02 DIAGNOSIS — H0288A Meibomian gland dysfunction right eye, upper and lower eyelids: Secondary | ICD-10-CM | POA: Diagnosis not present

## 2020-09-02 MED ORDER — HYDROCORTISONE (PERIANAL) 2.5 % EX CREA: 1.0000 "application " | TOPICAL_CREAM | Freq: Two times a day (BID) | CUTANEOUS | 1 refills | Status: DC | PRN

## 2020-09-02 NOTE — Patient Instructions (Signed)
Let's have you take the fiber in the morning. Give this about a week and let us know how it is working. If not ideal, you can start taking  Linzess 1 capsule 30 minutes before breakfast daily. It is normal to have some looser stool starting out for the first few days, but this should improve. If it does not, please call us, as we will need to adjust the dosage.   I would like to see you back in 3-4 months! We won't pursue any bandings right now. Continue with Anusol as needed.  I enjoyed seeing you again today! As you know, I value our relationship and want to provide genuine, compassionate, and quality care. I welcome your feedback. If you receive a survey regarding your visit,  I greatly appreciate you taking time to fill this out. See you next time!  Annitta Needs, PhD, ANP-BC [facility]

## 2020-09-02 NOTE — Progress Notes (Signed)
Referring Provider: Caryl Bis, MD Primary Care Physician:  Caryl Bis, MD Primary GI: Dr. Gala Romney   Chief Complaint  Patient presents with  . Hemorrhoids    had some problems after last banding with bleeding     HPI:   Brittany Shields is a 69 y.o. female presenting today with a history of pancolonic diverticulosis, Grade 2 hemorrhoids, s/p banding of left lateral hemorrhoid 07/10/20. She presented to the ED at [facility] 07/26/20 with acute onset large volume rectal bleeding with clots. Hgb 14.4 in May 2021. Hgb 11.6 on presentation and down to 10.2 while admitted. CT abdomen/pelvis with/without contrast: no site of active GI bleeding.   Notes morning of 9/12 she had large amount of bleeding starting at 3am. Painless. Clots. Resolved while at [facility]. No intervention needed. Denies any aspirin products, Ibuprofen. Prednisone 5 mg daily chronically.   Feels like BMs are unproductive. No fecal soiling. Feels like she is going in her underwear but nothing there. Has rolling around in abdomen. Smaller amount of blood than prior to banding. BM a few times a day. Taking Benefiber two times a day, 1 tablespoon.   Past Medical History:  Diagnosis Date  . Arthritis   . Chronic sinusitis   . Generalized headaches   . GERD (gastroesophageal reflux disease)   . Hypothyroidism     Past Surgical History:  Procedure Laterality Date  . ANTERIOR CERVICAL DECOMP/DISCECTOMY FUSION N/A 08/28/2017   Procedure: ANTERIOR CERVICAL DECOMPRESSION AND FUSION CERVICAL FIVE-SIX,CERVICAL SIX-SEVEN;  Surgeon: Earnie Larsson, MD;  Location: [place];  Service: Neurosurgery;  Laterality: N/A;  anterior  . BIOPSY  01/24/2020   Procedure: BIOPSY;  Surgeon: Daneil Dolin, MD;  [facility];  Service: Endoscopy;;  gastric polyp; gastric mucosa  . CARPAL TUNNEL RELEASE Right 2001  . CATARACT EXTRACTION W/PHACO Right 08/08/2017   Procedure: CATARACT EXTRACTION PHACO AND INTRAOCULAR LENS PLACEMENT  ([facility]);  Surgeon: Rutherford Guys, MD;  [facility];  Service: Ophthalmology;  Laterality: Right;  CDE: 4.18  . CATARACT EXTRACTION W/PHACO Left 11/10/2017   Procedure: CATARACT EXTRACTION PHACO AND INTRAOCULAR LENS PLACEMENT ([facility]);  Surgeon: Rutherford Guys, MD;  [facility];  Service: Ophthalmology;  Laterality: Left;  CDE: 3.27  . CHOLECYSTECTOMY    . COLONOSCOPY    . COLONOSCOPY N/A 04/03/2020   Procedure: COLONOSCOPY;  Surgeon: Daneil Dolin, MD;  [facility];  Service: Endoscopy;  Laterality: N/A;  10:00am  . CYST EXCISION     right eye  . ESOPHAGOGASTRODUODENOSCOPY N/A 01/24/2020   Procedure: ESOPHAGOGASTRODUODENOSCOPY (EGD);  Surgeon: Daneil Dolin, MD;  [facility];  Service: Endoscopy;  Laterality: N/A;  2:00pm  . EYE SURGERY    . HAMMER TOE SURGERY Bilateral   . NASAL/SINUS ENDOSCOPY    . TUBAL LIGATION    . tubal reversal  1991    Current Outpatient Medications  Medication Sig Dispense Refill  . acetaminophen (TYLENOL) 500 MG tablet Take 1,000 mg by mouth every 6 (six) hours as needed for moderate pain or headache.    . Bromfenac Sodium 0.07 % SOLN Apply 1 drop to eye daily.    . Calcium Carb-Cholecalciferol (CALCIUM 600+D3 PO) Take 2 tablets by mouth every morning.     . Carboxymethylcellulose Sodium (REFRESH TEARS OP) Place 1 application into both eyes as needed (dry eyes).     . diphenhydrAMINE (BENADRYL) 25 MG tablet Take 25 mg by mouth every 6 (six) hours  as needed.    . docusate sodium (COLACE) 100 MG capsule Take 200 mg by mouth at bedtime.    Marland Kitchen estradiol (ESTRACE) 1 MG tablet Take 1 mg by mouth at bedtime.    . Fremanezumab-vfrm (AJOVY) 225 MG/1.5ML SOSY Inject into the skin every 30 (thirty) days.    . hydrocortisone (ANUSOL-HC) 2.5 % rectal cream Place 1 application rectally 2 (two) times daily as needed for hemorrhoids or anal itching. 30 g 1  . Hypertonic Nasal Wash (SINUS RINSE NA) Place 1 each into the nose 2 (two) times daily.      Marland Kitchen levothyroxine (SYNTHROID, LEVOTHROID) 50 MCG tablet Take 50 mcg by mouth daily before breakfast.    . omeprazole (PRILOSEC) 40 MG capsule Take 1 capsule (40 mg total) by mouth 2 (two) times daily. 180 capsule 3  . ondansetron (ZOFRAN) 4 MG tablet Take 1 tablet (4 mg total) by mouth every 8 (eight) hours as needed for nausea or vomiting. 30 tablet 1  . predniSONE (DELTASONE) 5 MG tablet Take 5 mg by mouth daily with breakfast.    . Probiotic Product (PROBIOTIC DAILY) CAPS Take 1 capsule by mouth daily.    . progesterone (PROMETRIUM) 100 MG capsule Take 100 mg by mouth at bedtime.     Marland Kitchen venlafaxine XR (EFFEXOR-XR) 75 MG 24 hr capsule Take 75 mg by mouth at bedtime.    . Wheat Dextrin (BENEFIBER DRINK MIX PO) Take by mouth. 2 Tbsp daily     No current facility-administered medications for this visit.    Allergies as of 09/02/2020 - Review Complete 09/02/2020  Allergen Reaction Noted  . Zithromax [azithromycin] Hives and Rash 08/02/2017  . Lifitegrast Itching 05/25/2018    Family History  Problem Relation Age of Onset  . Other Mother   . Other Father   . Colon cancer Neg Hx     Social History   Socioeconomic History  . Marital status: Married    Spouse name: Not on file  . Number of children: Not on file  . Years of education: Not on file  . Highest education level: Not on file  Occupational History  . Not on file  Tobacco Use  . Smoking status: Never Smoker  . Smokeless tobacco: Never Used  Vaping Use  . Vaping Use: Never used  Substance and Sexual Activity  . Alcohol use: No  . Drug use: No  . Sexual activity: Never  Other Topics Concern  . Not on file  Social History Narrative  . Not on file   Social Determinants of Health   Financial Resource Strain:   . Difficulty of Paying Living Expenses: Not on file  Food Insecurity:   . Worried About [place] fund[place] in the Last Year: Not on file  . Ran Out of Food in the Last Year: Not on file  Transportation  Needs:   . Lack of Transportation (Medical): Not on file  . Lack of Transportation (Non-Medical): Not on file  Physical Activity:   . Days of Exercise per Week: Not on file  . Minutes of Exercise per Session: Not on file  Stress:   . Feeling of Stress : Not on file  Social Connections:   . Frequency of Communication with Friends and Family: Not on file  . Frequency of Social Gatherings with Friends and Family: Not on file  . Attends Religious Services: Not on file  . Active Member of Clubs or Organizations: Not on file  . Attends Club  or Organization Meetings: Not on file  . Marital Status: Not on file    Review of Systems: Gen: Denies fever, chills, anorexia. Denies fatigue, weakness, weight loss.  CV: Denies chest pain, palpitations, syncope, peripheral edema, and claudication. Resp: Denies dyspnea at rest, cough, wheezing, coughing up blood, and pleurisy. GI: see HPI Derm: Denies rash, itching, dry skin Psych: Denies depression, anxiety, memory loss, confusion. No homicidal or suicidal ideation.  Heme: see HPI  Physical Exam: BP 138/84   Pulse (!) 104   Temp (!) 97.3 F (36.3 C) (Temporal)   Ht 5' 3.5" (1.613 m)   Wt 145 lb (65.8 kg)   BMI 25.28 kg/m  General:   Alert and oriented. No distress noted. Pleasant and cooperative.  Head:  Normocephalic and atraumatic. Eyes:  Conjuctiva clear without scleral icterus. Mouth:  Mask in place Abdomen:  +BS, soft, non-tender and non-distended. No rebound or guarding. No HSM or masses noted. Msk:  Symmetrical without gross deformities. Normal posture. Extremities:  Without edema. Neurologic:  Alert and  oriented x4 Psych:  Alert and cooperative. Normal mood and affect.  ASSESSMENT/PLAN: Kalayah Leske is a 69 y.o. female presenting today with a history of pancolonic diverticulosis, Grade 2 hemorrhoids, s/p banding of left lateral hemorrhoid 07/10/20. She presented to the ED at [facility] 07/26/20 with acute onset large volume rectal  bleeding with clots. In review of her labs, it does show a decrease in Hgb from her baseline. She denied any aspirin, Ibuprofen, on no anticoagulants or anti-platelets. She is on low-dose prednisone for many years. I suspect she had a delayed bleed after sloughing of the scab that would occur around the 10-14 day mark.  At this point, we will manage her hemorrhoids conservatively without further banding, as she notes minimal blood with wiping and no itching, prolapsing, or other bothersome symptoms. Will not pursue further banding due to recent events.   Will maximize bowel regimen and increase fiber. I have provided Linzess 72 mcg samples in case she needs to incorporate this into her regimen. Anusol provided for supportive measures.  Return in 3-4 months.  Annitta Needs, PhD, ANP-BC [facility]

## 2020-09-07 DIAGNOSIS — Z23 Encounter for immunization: Secondary | ICD-10-CM | POA: Diagnosis not present

## 2020-09-08 ENCOUNTER — Telehealth: Payer: Self-pay | Admitting: Internal Medicine

## 2020-09-08 NOTE — Telephone Encounter (Signed)
Yes, have her go back to her benefiber dosing that she was doing before. Start Linzess 72 mcg daily, 30 minutes before breakfast, and let us know how this works for her.

## 2020-09-08 NOTE — Progress Notes (Signed)
Cc'ed to pcp °

## 2020-09-08 NOTE — Telephone Encounter (Signed)
I spoke with pt. Pt was seen on 09/02/20 and increased Benefiber to 1 1/2 tsp twice daily. Pt thought it would let her have better bowel movements and it hasn't pt has started having abdominal cramping. Pt will have a BM but it is very small. Discussed pt starting the Linzess 72 mcg samples that were given at her apt. Per instructions if Benefiber isn't working as well, pt was to start Linzess 72 mcg. Please advise.

## 2020-09-08 NOTE — Telephone Encounter (Signed)
Spoke with pt. Pt was notified of Brittany Shields's recommendations. Pt will call back with a progress report.

## 2020-09-08 NOTE — Telephone Encounter (Signed)
Pt has questions about taking Linzess. Please call [phone]

## 2020-09-15 DIAGNOSIS — Z888 Allergy status to other drugs, medicaments and biological substances status: Secondary | ICD-10-CM | POA: Diagnosis not present

## 2020-09-15 DIAGNOSIS — Z79899 Other long term (current) drug therapy: Secondary | ICD-10-CM | POA: Diagnosis not present

## 2020-09-15 DIAGNOSIS — J310 Chronic rhinitis: Secondary | ICD-10-CM | POA: Diagnosis not present

## 2020-09-15 DIAGNOSIS — K219 Gastro-esophageal reflux disease without esophagitis: Secondary | ICD-10-CM | POA: Diagnosis not present

## 2020-09-15 DIAGNOSIS — Z881 Allergy status to other antibiotic agents status: Secondary | ICD-10-CM | POA: Diagnosis not present

## 2020-09-15 DIAGNOSIS — Z7952 Long term (current) use of systemic steroids: Secondary | ICD-10-CM | POA: Diagnosis not present

## 2020-09-15 DIAGNOSIS — J328 Other chronic sinusitis: Secondary | ICD-10-CM | POA: Diagnosis not present

## 2020-09-15 DIAGNOSIS — R0982 Postnasal drip: Secondary | ICD-10-CM | POA: Diagnosis not present

## 2020-09-16 ENCOUNTER — Telehealth: Payer: Self-pay | Admitting: Internal Medicine

## 2020-09-16 NOTE — Telephone Encounter (Signed)
Patient said the samples of Linzess has been helping and she was about out of [place]. She wanted to know if Roseanne Kaufman, NP wanted her to continue taking them. If so, she said she would need a prescription sent to [facility]. Please advise. [phone]

## 2020-09-16 NOTE — Telephone Encounter (Signed)
Routing to the refill box to send in a script for [employer]

## 2020-09-18 ENCOUNTER — Other Ambulatory Visit: Payer: Self-pay

## 2020-09-18 ENCOUNTER — Telehealth: Payer: Self-pay

## 2020-09-18 MED ORDER — LINACLOTIDE 72 MCG PO CAPS: 72.0000 ug | ORAL_CAPSULE | Freq: Every day | ORAL | 11 refills | Status: DC

## 2020-09-18 NOTE — Telephone Encounter (Signed)
Glad to hear Linzess is working.  She will be on it long-term.  It will likely work as long as she takes it.  If she stops it, she will be back to where she was prior to starting it.  Linzess 72-dispense 30 with 11 refills.  Take 1 daily.

## 2020-09-18 NOTE — Telephone Encounter (Signed)
PHONE PT BACK AND LET HER KNOW LINZESS 72 WAS SENT TO [facility] FOR HER AND THAT DR. Gala Romney  STATES AFTER REVIEWING HER CHART THAT IT IS LONG TERM.

## 2020-09-18 NOTE — Telephone Encounter (Addendum)
PT. PHONED THIS AM WANTING RX FOR LINZESS PHONED IN AT [facility] IN [place] IF THIS IS A SHORT TERM MEDICATION FOR HER. IF ITS GOING TO LONG TERM SHE WANTS RX SENT TO [facility]. SHE STATES ITS HELPING HER AND SHE HAS ONLY ONE PILL LEFT TO TAKE.

## 2020-09-21 ENCOUNTER — Other Ambulatory Visit: Payer: Self-pay

## 2020-09-21 DIAGNOSIS — R0982 Postnasal drip: Secondary | ICD-10-CM | POA: Diagnosis not present

## 2020-09-21 DIAGNOSIS — R059 Cough, unspecified: Secondary | ICD-10-CM | POA: Diagnosis not present

## 2020-09-21 DIAGNOSIS — Z20828 Contact with and (suspected) exposure to other viral communicable diseases: Secondary | ICD-10-CM | POA: Diagnosis not present

## 2020-09-21 MED ORDER — LINACLOTIDE 72 MCG PO CAPS: 72.0000 ug | ORAL_CAPSULE | Freq: Every day | ORAL | 3 refills | Status: DC

## 2020-09-21 NOTE — Telephone Encounter (Signed)
Pt called back and would like a 90 day supply of Linzess 72 mcg [facility] instead of a 30 day supply. Please advise if this is ok.

## 2020-09-21 NOTE — Telephone Encounter (Signed)
Noted. Linzess 72 mcg 1 pill daily #90 with 3 refills sent to [facility] per Dr. Gala Romney.

## 2020-09-21 NOTE — Telephone Encounter (Signed)
Okay on Linzess 72 1 capsule daily.  Dispense 90 with 3 refills

## 2020-09-22 ENCOUNTER — Telehealth: Payer: Self-pay | Admitting: Internal Medicine

## 2020-09-22 NOTE — Telephone Encounter (Signed)
Spoke with [facility]. Pt has tried and failed stool softeners and Benefiber. PA was completed with Katie via phone from [place] and approved through 09-22-2021. Approval letter will be scanned in pts chart when approved.

## 2020-09-22 NOTE — Telephone Encounter (Signed)
PATIENT CALLED AND SAID HER LINZESS NEEDS PRIOR AUTH

## 2020-09-23 NOTE — Telephone Encounter (Signed)
This has already been addressed. See additional phone notes.

## 2020-12-01 DIAGNOSIS — Z01419 Encounter for gynecological examination (general) (routine) without abnormal findings: Secondary | ICD-10-CM | POA: Diagnosis not present

## 2020-12-03 DIAGNOSIS — E782 Mixed hyperlipidemia: Secondary | ICD-10-CM | POA: Diagnosis not present

## 2020-12-03 DIAGNOSIS — M3130 Wegener's granulomatosis without renal involvement: Secondary | ICD-10-CM | POA: Diagnosis not present

## 2020-12-03 DIAGNOSIS — E039 Hypothyroidism, unspecified: Secondary | ICD-10-CM | POA: Diagnosis not present

## 2020-12-03 DIAGNOSIS — N183 Chronic kidney disease, stage 3 unspecified: Secondary | ICD-10-CM | POA: Diagnosis not present

## 2020-12-03 DIAGNOSIS — M797 Fibromyalgia: Secondary | ICD-10-CM | POA: Diagnosis not present

## 2020-12-03 DIAGNOSIS — K2100 Gastro-esophageal reflux disease with esophagitis, without bleeding: Secondary | ICD-10-CM | POA: Diagnosis not present

## 2020-12-03 DIAGNOSIS — E7849 Other hyperlipidemia: Secondary | ICD-10-CM | POA: Diagnosis not present

## 2020-12-08 DIAGNOSIS — Z6824 Body mass index (BMI) 24.0-24.9, adult: Secondary | ICD-10-CM | POA: Diagnosis not present

## 2020-12-08 DIAGNOSIS — M3130 Wegener's granulomatosis without renal involvement: Secondary | ICD-10-CM | POA: Diagnosis not present

## 2020-12-08 DIAGNOSIS — G43909 Migraine, unspecified, not intractable, without status migrainosus: Secondary | ICD-10-CM | POA: Diagnosis not present

## 2020-12-08 DIAGNOSIS — M797 Fibromyalgia: Secondary | ICD-10-CM | POA: Diagnosis not present

## 2020-12-08 DIAGNOSIS — M5030 Other cervical disc degeneration, unspecified cervical region: Secondary | ICD-10-CM | POA: Diagnosis not present

## 2020-12-08 DIAGNOSIS — K2100 Gastro-esophageal reflux disease with esophagitis, without bleeding: Secondary | ICD-10-CM | POA: Diagnosis not present

## 2020-12-08 DIAGNOSIS — E039 Hypothyroidism, unspecified: Secondary | ICD-10-CM | POA: Diagnosis not present

## 2020-12-08 DIAGNOSIS — E7849 Other hyperlipidemia: Secondary | ICD-10-CM | POA: Diagnosis not present

## 2020-12-22 ENCOUNTER — Other Ambulatory Visit: Payer: Self-pay

## 2020-12-22 ENCOUNTER — Encounter: Payer: Self-pay | Admitting: Gastroenterology

## 2020-12-22 ENCOUNTER — Ambulatory Visit (INDEPENDENT_AMBULATORY_CARE_PROVIDER_SITE_OTHER): Payer: Medicare Other | Admitting: Gastroenterology

## 2020-12-22 DIAGNOSIS — K5900 Constipation, unspecified: Secondary | ICD-10-CM | POA: Diagnosis not present

## 2020-12-22 DIAGNOSIS — K625 Hemorrhage of anus and rectum: Secondary | ICD-10-CM

## 2020-12-22 MED ORDER — HYDROCORTISONE (PERIANAL) 2.5 % EX CREA: 1.0000 "application " | TOPICAL_CREAM | Freq: Two times a day (BID) | CUTANEOUS | 1 refills | Status: DC | PRN

## 2020-12-22 NOTE — Progress Notes (Unsigned)
Referring Provider: Caryl Bis, MD Primary Care Physician:  Caryl Bis, MD Primary GI: Dr. Gala Romney   Chief Complaint  Patient presents with  . Hemorrhoids  . Rectal Bleeding    HPI:   Brittany Shields is a 70 y.o. female presenting today with a history of pancolonic diverticulosis, Grade 2 hemorrhoids, s/p banding of left lateral hemorrhoid 07/10/20. She presented to the ED at [facility]/26/20 with acute onset large volume rectal bleeding with clots. In review of her labs, it does show a decrease in Hgb from her baseline. She denied any aspirin, Ibuprofen, on no anticoagulants or anti-platelets. She is on low-dose prednisone for many years. I suspect she had a delayed bleed after sloughing of the scab that would occur around the 10-14 day mark. Unable to rule out diverticular origin.   Tablespoon of Benefiber in morning and evening. Eats a lot of fruit and veggies. Gas seems to be getting worse. Gas-x not helpful. Linzess 72 mcg is helpful at times. When feels like she Shields to the bathroom, feels like it is in her pants. Sometimes stool is hard on certain days.  If BM is softer, will not bleed. Hemorrhoids protruding at times. At one point thought it was getting better.   [facility]   Past Medical History:  Diagnosis Date  . Arthritis   . Chronic sinusitis   . Generalized headaches   . GERD (gastroesophageal reflux disease)   . Hypothyroidism     Past Surgical History:  Procedure Laterality Date  . ANTERIOR CERVICAL DECOMP/DISCECTOMY FUSION N/A 08/28/2017   Procedure: ANTERIOR CERVICAL DECOMPRESSION AND FUSION CERVICAL FIVE-SIX,CERVICAL SIX-SEVEN;  Surgeon: Earnie Larsson, MD;  Location: [place];  Service: Neurosurgery;  Laterality: N/A;  anterior  . BIOPSY  01/24/2020   Procedure: BIOPSY;  Surgeon: Daneil Dolin, MD;  [facility];  Service: Endoscopy;;  gastric polyp; gastric mucosa  . CARPAL TUNNEL RELEASE Right 2001  . CATARACT  EXTRACTION W/PHACO Right 08/08/2017   Procedure: CATARACT EXTRACTION PHACO AND INTRAOCULAR LENS PLACEMENT (IOC);  Surgeon: Rutherford Guys, MD;  [facility];  Service: Ophthalmology;  Laterality: Right;  CDE: 4.18  . CATARACT EXTRACTION W/PHACO Left 11/10/2017   Procedure: CATARACT EXTRACTION PHACO AND INTRAOCULAR LENS PLACEMENT (IOC);  Surgeon: Rutherford Guys, MD;  Location: [facility];  Service: Ophthalmology;  Laterality: Left;  CDE: 3.27  . CHOLECYSTECTOMY    . COLONOSCOPY    . COLONOSCOPY N/A 04/03/2020   Procedure: COLONOSCOPY;  Surgeon: Daneil Dolin, MD;  [facility];  Service: Endoscopy;  Laterality: N/A;  10:00am  . CYST EXCISION     right eye  . ESOPHAGOGASTRODUODENOSCOPY N/A 01/24/2020   Procedure: ESOPHAGOGASTRODUODENOSCOPY (EGD);  Surgeon: Daneil Dolin, MD;  [facility];  Service: Endoscopy;  Laterality: N/A;  2:00pm  . EYE SURGERY    . HAMMER TOE SURGERY Bilateral   . NASAL/SINUS ENDOSCOPY    . TUBAL LIGATION    . tubal reversal  1991    Current Outpatient Medications  Medication Sig Dispense Refill  . acetaminophen (TYLENOL) 500 MG tablet Take 1,000 mg by mouth as needed for moderate pain or headache.    . Bromfenac Sodium 0.07 % SOLN Apply 2 drops to eye 2 (two) times daily.    . Calcium Carb-Cholecalciferol (CALCIUM 600+D3 PO) Take 2 tablets by mouth every morning.     . Carboxymethylcellulose Sodium (REFRESH TEARS OP) Place 1 application into both eyes as  needed (dry eyes).     Marland Kitchen docusate sodium (COLACE) 100 MG capsule Take 200 mg by mouth at bedtime.    Marland Kitchen estradiol (EVAMIST) 1.53 MG/SPRAY transdermal spray Place 1 spray onto the skin at bedtime.    . Fremanezumab-vfrm (AJOVY) 225 MG/1.5ML SOSY Inject into the skin every 30 (thirty) days.    . hydrocortisone (ANUSOL-HC) 2.5 % rectal cream Place 1 application rectally 2 (two) times daily as needed for hemorrhoids or anal itching. 30 g 1  . Hypertonic Nasal Wash (SINUS RINSE NA) Place 1 each into  the nose 2 (two) times daily.    Marland Kitchen levothyroxine (SYNTHROID, LEVOTHROID) 50 MCG tablet Take 50 mcg by mouth daily before breakfast.    . linaclotide (LINZESS) 72 MCG capsule Take 1 capsule (72 mcg total) by mouth daily before breakfast. 30 capsule 11  . miconazole (MONISTAT 7) 2 % vaginal cream Place 1 Applicatorful vaginally 2 (two) times daily.    Marland Kitchen omeprazole (PRILOSEC) 40 MG capsule Take 1 capsule (40 mg total) by mouth 2 (two) times daily. 180 capsule 3  . ondansetron (ZOFRAN) 4 MG tablet Take 1 tablet (4 mg total) by mouth every 8 (eight) hours as needed for nausea or vomiting. (Patient taking differently: Take 4 mg by mouth as needed for nausea or vomiting.) 30 tablet 1  . predniSONE (DELTASONE) 5 MG tablet Take 5 mg by mouth daily with breakfast.    . Probiotic Product (PROBIOTIC DAILY) CAPS Take 1 capsule by mouth daily.    . progesterone (PROMETRIUM) 100 MG capsule Take 100 mg by mouth at bedtime.     Marland Kitchen venlafaxine XR (EFFEXOR-XR) 75 MG 24 hr capsule Take 150 mg by mouth at bedtime.    . Wheat Dextrin (BENEFIBER DRINK MIX PO) Take by mouth. 2 Tbsp daily    . diphenhydrAMINE (BENADRYL) 25 MG tablet Take 25 mg by mouth every 6 (six) hours as needed.    Marland Kitchen estradiol (ESTRACE) 1 MG tablet Take 1 mg by mouth at bedtime. (Patient not taking: Reported on 12/22/2020)     No current facility-administered medications for this visit.    Allergies as of 12/22/2020 - Review Complete 12/22/2020  Allergen Reaction Noted  . Zithromax [azithromycin] Hives and Rash 08/02/2017  . Lifitegrast Itching 05/25/2018    Family History  Problem Relation Age of Onset  . Other Mother   . Other Father   . Colon cancer Neg Hx     Social History   Socioeconomic History  . Marital status: Married    Spouse name: Not on file  . Number of children: Not on file  . Years of education: Not on file  . Highest education level: Not on file  Occupational History  . Not on file  Tobacco Use  . Smoking status:  Never Smoker  . Smokeless tobacco: Never Used  Vaping Use  . Vaping Use: Never used  Substance and Sexual Activity  . Alcohol use: No  . Drug use: No  . Sexual activity: Never  Other Topics Concern  . Not on file  Social History Narrative  . Not on file   Social Determinants of Health   Financial Resource Strain: Not on file  Food Insecurity: Not on file  Transportation Shields: Not on file  Physical Activity: Not on file  Stress: Not on file  Social Connections: Not on file    Review of Systems: Gen: Denies fever, chills, anorexia. Denies fatigue, weakness, weight loss.  CV: Denies chest pain, palpitations,  syncope, peripheral edema, and claudication. Resp: Denies dyspnea at rest, cough, wheezing, coughing up blood, and pleurisy. GI: see HPI Derm: Denies rash, itching, dry skin Psych: Denies depression, anxiety, memory loss, confusion. No homicidal or suicidal ideation.  Heme: Denies bruising, bleeding, and enlarged lymph nodes.  Physical Exam: BP (!) 145/90   Pulse (!) 114   Temp (!) 97.3 F (36.3 C) (Temporal)   Ht 5\' 4"  (1.626 m)   Wt 147 lb 9.6 oz (67 kg)   BMI 25.34 kg/m  General:   Alert and oriented. No distress noted. Pleasant and cooperative.  Head:  Normocephalic and atraumatic. Eyes:  Conjuctiva clear without scleral icterus. Mouth:  Oral mucosa pink and moist. Good dentition. No lesions. Abdomen:  +BS, soft, non-tender and non-distended. No rebound or guarding. No HSM or masses noted. Msk:  Symmetrical without gross deformities. Normal posture. Extremities:  Without edema. Neurologic:  Alert and  oriented x4 Psych:  Alert and cooperative. Normal mood and affect.  ASSESSMENT: Brittany Shields is a 70 y.o. female presenting today with a history of pancolonic diverticulosis, Grade 2 hemorrhoids, s/p banding of left lateral hemorrhoid 07/10/20. She developed acute lower GI bleeding on 07/26/20 and presented to the ED at [facility]. Unclear if diverticular origin  or post-banding bleed. She denied any aspirin, Ibuprofen, on no anticoagulants or anti-platelets. She has been on low-dose prednisone for many years. I suspect she had a delayed bleed after sloughing of the scab that would occur around the 10-14 day mark. Unable to rule out diverticular origin. We have elected not to pursue additional banding at this time and treat conservatively.  Shields more effective bowel regimen. Will trial Linzess 145 mcg daily as lower dosage not as effective. With increased gas, will decrease Benefiber to just once per day.   She will call with progress report in 1-2 weeks.   Return in 6 months  Brittany Needs, PhD, [facility] Gastroenterology

## 2020-12-22 NOTE — Patient Instructions (Signed)
Let's decrease Benefiber to only once per day.  Stop Linzess 72 mcg for now. I have given you samples of Linzess 145 mcg daily. Let's try this and let me know how you are doing in about 1-2 weeks!  We will see you again in 6 months!  I enjoyed seeing you again today! As you know, I value our relationship and want to provide genuine, compassionate, and quality care. I welcome your feedback. If you receive a survey regarding your visit,  I greatly appreciate you taking time to fill this out. See you next time!  Annitta Needs, PhD, ANP-BC [facility] Gastroenterology

## 2020-12-28 ENCOUNTER — Telehealth: Payer: Self-pay

## 2020-12-28 MED ORDER — LINACLOTIDE 145 MCG PO CAPS: 145.0000 ug | ORAL_CAPSULE | Freq: Every day | ORAL | 3 refills | Status: DC

## 2020-12-28 NOTE — Telephone Encounter (Signed)
Pt called to let Roseanne Kaufman, NP know that the Linzess 145 mcg is working well for her. Pt would like an RX sent to [facility].

## 2020-12-28 NOTE — Addendum Note (Signed)
Addended by: Annitta Needs on: 12/28/2020 01:56 PM   Modules accepted: Orders

## 2020-12-28 NOTE — Telephone Encounter (Signed)
Completed.

## 2021-01-27 DIAGNOSIS — H04123 Dry eye syndrome of bilateral lacrimal glands: Secondary | ICD-10-CM | POA: Diagnosis not present

## 2021-01-27 DIAGNOSIS — H0288B Meibomian gland dysfunction left eye, upper and lower eyelids: Secondary | ICD-10-CM | POA: Diagnosis not present

## 2021-01-27 DIAGNOSIS — H18419 Arcus senilis, unspecified eye: Secondary | ICD-10-CM | POA: Diagnosis not present

## 2021-01-27 DIAGNOSIS — H0288A Meibomian gland dysfunction right eye, upper and lower eyelids: Secondary | ICD-10-CM | POA: Diagnosis not present

## 2021-01-27 DIAGNOSIS — Z961 Presence of intraocular lens: Secondary | ICD-10-CM | POA: Diagnosis not present

## 2021-02-08 DIAGNOSIS — N951 Menopausal and female climacteric states: Secondary | ICD-10-CM | POA: Diagnosis not present

## 2021-03-18 DIAGNOSIS — Z1231 Encounter for screening mammogram for malignant neoplasm of breast: Secondary | ICD-10-CM | POA: Diagnosis not present

## 2021-03-24 DIAGNOSIS — R928 Other abnormal and inconclusive findings on diagnostic imaging of breast: Secondary | ICD-10-CM | POA: Diagnosis not present

## 2021-03-24 DIAGNOSIS — N6489 Other specified disorders of breast: Secondary | ICD-10-CM | POA: Diagnosis not present

## 2021-04-02 DIAGNOSIS — E782 Mixed hyperlipidemia: Secondary | ICD-10-CM | POA: Diagnosis not present

## 2021-04-02 DIAGNOSIS — E7849 Other hyperlipidemia: Secondary | ICD-10-CM | POA: Diagnosis not present

## 2021-04-02 DIAGNOSIS — N183 Chronic kidney disease, stage 3 unspecified: Secondary | ICD-10-CM | POA: Diagnosis not present

## 2021-04-02 DIAGNOSIS — Z131 Encounter for screening for diabetes mellitus: Secondary | ICD-10-CM | POA: Diagnosis not present

## 2021-04-02 DIAGNOSIS — E039 Hypothyroidism, unspecified: Secondary | ICD-10-CM | POA: Diagnosis not present

## 2021-04-05 DIAGNOSIS — M810 Age-related osteoporosis without current pathological fracture: Secondary | ICD-10-CM | POA: Diagnosis not present

## 2021-04-05 DIAGNOSIS — Z780 Asymptomatic menopausal state: Secondary | ICD-10-CM | POA: Diagnosis not present

## 2021-04-08 DIAGNOSIS — E039 Hypothyroidism, unspecified: Secondary | ICD-10-CM | POA: Diagnosis not present

## 2021-04-08 DIAGNOSIS — M3130 Wegener's granulomatosis without renal involvement: Secondary | ICD-10-CM | POA: Diagnosis not present

## 2021-04-08 DIAGNOSIS — Z1331 Encounter for screening for depression: Secondary | ICD-10-CM | POA: Diagnosis not present

## 2021-04-08 DIAGNOSIS — M25561 Pain in right knee: Secondary | ICD-10-CM | POA: Diagnosis not present

## 2021-04-08 DIAGNOSIS — Z23 Encounter for immunization: Secondary | ICD-10-CM | POA: Diagnosis not present

## 2021-04-08 DIAGNOSIS — N950 Postmenopausal bleeding: Secondary | ICD-10-CM | POA: Diagnosis not present

## 2021-04-08 DIAGNOSIS — Z1389 Encounter for screening for other disorder: Secondary | ICD-10-CM | POA: Diagnosis not present

## 2021-04-08 DIAGNOSIS — E7849 Other hyperlipidemia: Secondary | ICD-10-CM | POA: Diagnosis not present

## 2021-04-08 DIAGNOSIS — M797 Fibromyalgia: Secondary | ICD-10-CM | POA: Diagnosis not present

## 2021-04-15 DIAGNOSIS — N950 Postmenopausal bleeding: Secondary | ICD-10-CM | POA: Diagnosis not present

## 2021-04-15 DIAGNOSIS — D251 Intramural leiomyoma of uterus: Secondary | ICD-10-CM | POA: Diagnosis not present

## 2021-04-19 DIAGNOSIS — D251 Intramural leiomyoma of uterus: Secondary | ICD-10-CM | POA: Diagnosis not present

## 2021-04-19 DIAGNOSIS — N950 Postmenopausal bleeding: Secondary | ICD-10-CM | POA: Diagnosis not present

## 2021-04-29 ENCOUNTER — Encounter (INDEPENDENT_AMBULATORY_CARE_PROVIDER_SITE_OTHER): Payer: Self-pay | Admitting: Ophthalmology

## 2021-04-29 ENCOUNTER — Other Ambulatory Visit: Payer: Self-pay

## 2021-04-29 ENCOUNTER — Ambulatory Visit (INDEPENDENT_AMBULATORY_CARE_PROVIDER_SITE_OTHER): Payer: Medicare Other | Admitting: Ophthalmology

## 2021-04-29 DIAGNOSIS — H35372 Puckering of macula, left eye: Secondary | ICD-10-CM

## 2021-04-29 DIAGNOSIS — Z961 Presence of intraocular lens: Secondary | ICD-10-CM

## 2021-04-29 DIAGNOSIS — H04123 Dry eye syndrome of bilateral lacrimal glands: Secondary | ICD-10-CM | POA: Diagnosis not present

## 2021-04-29 DIAGNOSIS — H348112 Central retinal vein occlusion, right eye, stable: Secondary | ICD-10-CM | POA: Diagnosis not present

## 2021-04-29 NOTE — Assessment & Plan Note (Signed)
Stable OU 

## 2021-04-29 NOTE — Assessment & Plan Note (Signed)
Minor today on OCT, with no foveal distortion will observe

## 2021-04-29 NOTE — Assessment & Plan Note (Signed)
Treated as such in the past because of a corneal degenerative issue according to Dr. Susa Simmonds per patient report

## 2021-04-29 NOTE — Progress Notes (Signed)
04/29/2021     CHIEF COMPLAINT Patient presents for Retina Follow Up (1 Year F/U OU//Pt c/o continued blurred VA OD. Pt c/o dryness OU, but sts artificial tears do not help. VA stable OS.)   HISTORY OF PRESENT ILLNESS: Brittany Shields is a 70 y.o. female who presents to the clinic today for:   HPI     Retina Follow Up           Diagnosis: CRVO/BRVO   Laterality: right eye   Onset: 1 year ago   Severity: mild   Duration: 1 year   Course: stable   Comments: 1 Year F/U OU  Pt c/o continued blurred VA OD. Pt c/o dryness OU, but sts artificial tears do not help. VA stable OS.       Last edited by Milly Jakob, [place] on 04/29/2021  1:36 PM.      Referring physician: Caryl Bis, MD [place],  [place] [id number]  HISTORICAL INFORMATION:   Selected notes from the MEDICAL RECORD NUMBER       CURRENT MEDICATIONS: Current Outpatient Medications (Ophthalmic Drugs)  Medication Sig   Bromfenac Sodium 0.07 % SOLN Apply 2 drops to eye 2 (two) times daily.   Carboxymethylcellulose Sodium (REFRESH TEARS OP) Place 1 application into both eyes as needed (dry eyes).    No current facility-administered medications for this visit. (Ophthalmic Drugs)   Current Outpatient Medications (Other)  Medication Sig   acetaminophen (TYLENOL) 500 MG tablet Take 1,000 mg by mouth as needed for moderate pain or headache.   Calcium Carb-Cholecalciferol (CALCIUM 600+D3 PO) Take 2 tablets by mouth every morning.    diphenhydrAMINE (BENADRYL) 25 MG tablet Take 25 mg by mouth every 6 (six) hours as needed.   docusate sodium (COLACE) 100 MG capsule Take 200 mg by mouth at bedtime.   estradiol (EVAMIST) 1.53 MG/SPRAY transdermal spray Place 1 spray onto the skin at bedtime.   Fremanezumab-vfrm (AJOVY) 225 MG/1.5ML SOSY Inject into the skin every 30 (thirty) days.   hydrocortisone (ANUSOL-HC) 2.5 % rectal cream Place 1 application rectally 2 (two) times daily as needed for hemorrhoids or anal  itching.   Hypertonic Nasal Wash (SINUS RINSE NA) Place 1 each into the nose 2 (two) times daily.   levothyroxine (SYNTHROID, LEVOTHROID) 50 MCG tablet Take 50 mcg by mouth daily before breakfast.   linaclotide (LINZESS) 145 MCG CAPS capsule Take 1 capsule (145 mcg total) by mouth daily before breakfast.   miconazole (MONISTAT 7) 2 % vaginal cream Place 1 Applicatorful vaginally 2 (two) times daily.   omeprazole (PRILOSEC) 40 MG capsule Take 1 capsule (40 mg total) by mouth 2 (two) times daily.   ondansetron (ZOFRAN) 4 MG tablet Take 1 tablet (4 mg total) by mouth every 8 (eight) hours as needed for nausea or vomiting. (Patient taking differently: Take 4 mg by mouth as needed for nausea or vomiting.)   predniSONE (DELTASONE) 5 MG tablet Take 5 mg by mouth daily with breakfast.   Probiotic Product (PROBIOTIC DAILY) CAPS Take 1 capsule by mouth daily.   progesterone (PROMETRIUM) 100 MG capsule Take 100 mg by mouth at bedtime.    venlafaxine XR (EFFEXOR-XR) 75 MG 24 hr capsule Take 150 mg by mouth at bedtime.   Wheat Dextrin (BENEFIBER DRINK MIX PO) Take by mouth. 2 Tbsp daily   No current facility-administered medications for this visit. (Other)      REVIEW OF SYSTEMS:    ALLERGIES Allergies  Allergen Reactions  Zithromax [Azithromycin] Hives and Rash   Lifitegrast Itching    Burning pain Shirley Friar    PAST MEDICAL HISTORY Past Medical History:  Diagnosis Date   Arthritis    Chronic sinusitis    Generalized headaches    GERD (gastroesophageal reflux disease)    Hypothyroidism    Past Surgical History:  Procedure Laterality Date   ANTERIOR CERVICAL DECOMP/DISCECTOMY FUSION N/A 08/28/2017   Procedure: ANTERIOR CERVICAL DECOMPRESSION AND FUSION CERVICAL FIVE-SIX,CERVICAL SIX-SEVEN;  Surgeon: Earnie Larsson, MD;  Location: [place];  Service: Neurosurgery;  Laterality: N/A;  anterior   BIOPSY  01/24/2020   Procedure: BIOPSY;  Surgeon: Daneil Dolin, MD;  [facility];   Service: Endoscopy;;  gastric polyp; gastric mucosa   CARPAL TUNNEL RELEASE Right 2001   CATARACT EXTRACTION W/PHACO Right 08/08/2017   Procedure: CATARACT EXTRACTION PHACO AND INTRAOCULAR LENS PLACEMENT ([place]);  Surgeon: Rutherford Guys, MD;  [facility];  Service: Ophthalmology;  Laterality: Right;  CDE: 4.18   CATARACT EXTRACTION W/PHACO Left 11/10/2017   Procedure: CATARACT EXTRACTION PHACO AND INTRAOCULAR LENS PLACEMENT (IOC);  Surgeon: Rutherford Guys, MD;  [facility];  Service: Ophthalmology;  Laterality: Left;  CDE: 3.27   CHOLECYSTECTOMY     COLONOSCOPY     COLONOSCOPY N/A 04/03/2020   Procedure: COLONOSCOPY;  Surgeon: Daneil Dolin, MD;  [facility];  Service: Endoscopy;  Laterality: N/A;  10:00am   CYST EXCISION     right eye   ESOPHAGOGASTRODUODENOSCOPY N/A 01/24/2020   Procedure: ESOPHAGOGASTRODUODENOSCOPY (EGD);  Surgeon: Daneil Dolin, MD;  [facility];  Service: Endoscopy;  Laterality: N/A;  2:00pm   EYE SURGERY     HAMMER TOE SURGERY Bilateral    NASAL/SINUS ENDOSCOPY     TUBAL LIGATION     tubal reversal  1991    FAMILY HISTORY Family History  Problem Relation Age of Onset   Other Mother    Other Father    Colon cancer Neg Hx     SOCIAL HISTORY Social History   Tobacco Use   Smoking status: Never   Smokeless tobacco: Never  Vaping Use   Vaping Use: Never used  Substance Use Topics   Alcohol use: No   Drug use: No         OPHTHALMIC EXAM:  Base Eye Exam     Visual Acuity (ETDRS)       Right Left   Dist cc 20/50 +1 20/30 +1   Dist ph cc 20/40 +2 20/25 -2    Correction: Glasses         Tonometry (Tonopen, 1:41 PM)       Right Left   Pressure 13 11         Pupils       Pupils Dark Light Shape React APD   Right PERRL 6 6 Round Minimal None   Left PERRL 6 6 Round Minimal None         Visual Fields (Counting fingers)       Left Right    Full Full         Extraocular Movement       Right  Left    Full Full         Neuro/Psych     Oriented x3: Yes   Mood/Affect: Normal         Dilation     Both eyes: 1.0% Mydriacyl, 2.5% Phenylephrine @ 1:41 PM  Slit Lamp and Fundus Exam     External Exam       Right Left   External Normal Normal         Slit Lamp Exam       Right Left   Lids/Lashes Normal Normal   Conjunctiva/Sclera White and quiet White and quiet   Cornea Clear Clear   Anterior Chamber Deep and quiet Deep and quiet   Iris Round and reactive Round and reactive   Lens Centered posterior chamber intraocular lens Centered posterior chamber intraocular lens   Anterior Vitreous Normal Normal         Fundus Exam       Right Left   Posterior Vitreous Normal Normal   Disc Normal Normal   C/D Ratio 0.15 0.15   Macula Normal Normal   Vessels Old CRVO, OD, COMPENSATED,,,    Normal   Periphery Normal Normal            IMAGING AND PROCEDURES  Imaging and Procedures for 04/29/21  OCT, Retina - OU - Both Eyes       Right Eye Quality was good. Scan locations included subfoveal. Central Foveal Thickness: 250. Progression has been stable. Findings include abnormal foveal contour, outer retinal atrophy.   Left Eye Quality was good. Scan locations included subfoveal. Central Foveal Thickness: 302. Progression has been stable. Findings include abnormal foveal contour, epiretinal membrane.   Notes OS with no foveal distortion  OD with outer retinal disturbance and a parafoveal location overall margins this is stable with no signs of active disease.             ASSESSMENT/PLAN:  Macular pucker, left eye Minor today on OCT, with no foveal distortion will observe  Central retinal vein occlusion of right eye History of stabilized CRV O no active CME  Pseudophakia of both eyes Stable OU  Dry eyes, bilateral Treated as such in the past because of a corneal degenerative issue according to Dr. Susa Simmonds per patient report      ICD-10-CM   1. Stable central retinal vein occlusion of right eye  H34.8112 OCT, Retina - OU - Both Eyes    2. Macular pucker, left eye  H35.372     3. Pseudophakia of both eyes  Z96.1     4. Dry eyes, bilateral  H04.123       1.  No active maculopathy.  I explained the patient that epiretinal membrane left eye is minor no impact on acuity thus observation alone is warranted for the left eye  2.  He with old CRV O stabilized, very minor CME noted on OCT but not medically thickened no impact on acuity thus observe  3.  Ophthalmic Meds Ordered this visit:  No orders of the defined types were placed in this encounter.      Return in about 1 year (around 04/29/2022) for DILATE OU, COLOR FP, OCT.  There are no Patient Instructions on file for this visit.   Explained the diagnoses, plan, and follow up with the patient and they expressed understanding.  Patient expressed understanding of the importance of proper follow up care.   Clent Demark Ananda Sitzer M.D. Diseases & Surgery of the Retina and Vitreous [facility]ina [facility] [place] 04/29/21     Abbreviations: M myopia (nearsighted); A astigmatism; H hyperopia (farsighted); P presbyopia; Mrx spectacle prescription;  CTL contact lenses; OD right eye; OS left eye; OU both eyes  XT exotropia; ET esotropia; PEK punctate epithelial keratitis; PEE  punctate epithelial erosions; DES dry eye syndrome; MGD meibomian gland dysfunction; ATs artificial tears; PFAT's preservative free artificial tears; [place] nuclear sclerotic cataract; PSC posterior subcapsular cataract; ERM epi-retinal membrane; PVD posterior vitreous detachment; RD retinal detachment; DM diabetes mellitus; DR diabetic retinopathy; NPDR non-proliferative diabetic retinopathy; PDR proliferative diabetic retinopathy; CSME clinically significant macular edema; DME diabetic macular edema; dbh dot blot hemorrhages; CWS cotton wool spot; POAG primary open angle glaucoma; C/D cup-to-disc ratio;  HVF humphrey visual field; GVF goldmann visual field; OCT optical coherence tomography; IOP intraocular pressure; BRVO Branch retinal vein occlusion; CRVO central retinal vein occlusion; CRAO central retinal artery occlusion; BRAO branch retinal artery occlusion; RT retinal tear; SB scleral buckle; PPV pars plana vitrectomy; VH Vitreous hemorrhage; PRP panretinal laser photocoagulation; IVK intravitreal kenalog; VMT vitreomacular traction; MH Macular hole;  NVD neovascularization of the disc; NVE neovascularization elsewhere; AREDS age related eye disease study; ARMD age related macular degeneration; POAG primary open angle glaucoma; EBMD epithelial/anterior basement membrane dystrophy; ACIOL anterior chamber intraocular lens; IOL intraocular lens; PCIOL posterior chamber intraocular lens; Phaco/IOL phacoemulsification with intraocular lens placement; [place] photorefractive keratectomy; LASIK laser assisted in situ keratomileusis; HTN hypertension; DM diabetes mellitus; COPD chronic obstructive pulmonary disease

## 2021-04-29 NOTE — Assessment & Plan Note (Signed)
History of stabilized CRV O no active CME

## 2021-05-18 ENCOUNTER — Other Ambulatory Visit: Payer: Self-pay | Admitting: Nurse Practitioner

## 2021-05-18 DIAGNOSIS — K219 Gastro-esophageal reflux disease without esophagitis: Secondary | ICD-10-CM

## 2021-05-21 DIAGNOSIS — Z20822 Contact with and (suspected) exposure to covid-19: Secondary | ICD-10-CM | POA: Diagnosis not present

## 2021-06-13 DIAGNOSIS — M1711 Unilateral primary osteoarthritis, right knee: Secondary | ICD-10-CM | POA: Diagnosis not present

## 2021-06-13 DIAGNOSIS — M797 Fibromyalgia: Secondary | ICD-10-CM | POA: Diagnosis not present

## 2021-06-13 DIAGNOSIS — E039 Hypothyroidism, unspecified: Secondary | ICD-10-CM | POA: Diagnosis not present

## 2021-06-13 DIAGNOSIS — N183 Chronic kidney disease, stage 3 unspecified: Secondary | ICD-10-CM | POA: Diagnosis not present

## 2021-06-15 DIAGNOSIS — N951 Menopausal and female climacteric states: Secondary | ICD-10-CM | POA: Diagnosis not present

## 2021-06-15 DIAGNOSIS — D251 Intramural leiomyoma of uterus: Secondary | ICD-10-CM | POA: Diagnosis not present

## 2021-06-22 ENCOUNTER — Other Ambulatory Visit: Payer: Self-pay

## 2021-06-22 ENCOUNTER — Ambulatory Visit (INDEPENDENT_AMBULATORY_CARE_PROVIDER_SITE_OTHER): Payer: Medicare Other | Admitting: Gastroenterology

## 2021-06-22 ENCOUNTER — Encounter: Payer: Self-pay | Admitting: Gastroenterology

## 2021-06-22 VITALS — BP 153/92 | HR 106 | Temp 97.3°F | Ht 63.0 in | Wt 154.8 lb

## 2021-06-22 DIAGNOSIS — K5900 Constipation, unspecified: Secondary | ICD-10-CM

## 2021-06-22 DIAGNOSIS — K641 Second degree hemorrhoids: Secondary | ICD-10-CM

## 2021-06-22 MED ORDER — LINACLOTIDE 145 MCG PO CAPS: 145.0000 ug | ORAL_CAPSULE | Freq: Every day | ORAL | 3 refills | Status: DC

## 2021-06-22 NOTE — Patient Instructions (Signed)
Let's try benefiber every other day. Let me know how this works for you!  I have refilled the Linzess. Just use the Anusol as needed for rectal bleeding.   We will see you in 6-8 months!  I enjoyed seeing you again today! As you know, I value our relationship and want to provide genuine, compassionate, and quality care. I welcome your feedback. If you receive a survey regarding your visit,  I greatly appreciate you taking time to fill this out. See you next time!  Annitta Needs, PhD, ANP-BC [facility] Gastroenterology

## 2021-06-22 NOTE — Progress Notes (Signed)
Referring Provider: Caryl Bis, MD Primary Care Physician:  Caryl Bis, MD Primary GI: Dr. Gala Romney    Chief Complaint  Patient presents with   Hemorrhoids    Occ bleeding    HPI:   Zakiah Poorman is a 70 y.o. female presenting today with a history of pancolonic diverticulosis, Grade 2 hemorrhoids, s/p banding of left lateral hemorrhoid 07/10/20. She presented to the ED at [facility] 07/26/20 with acute onset large volume rectal bleeding with clots. In review of her labs, it does show a decrease in Hgb from her baseline. She denied any aspirin, Ibuprofen, on no anticoagulants or anti-platelets. She is on low-dose prednisone for many years. I suspect she had a delayed bleed after sloughing of the scab that would occur around the 10-14 day mark. Unable to rule out diverticular origin.  Presenting today in follow-up for hemorrhoids and constipation. She is hesitant to pursue any additional banding. Bleeding is rare but still occurs. Stomach rumbles a lot. Taking Benefiber 1 tablespoon daily and 145 mcg daily. [place] working well. Feels like stomach is overactive. BLoating and gas at times.       Past Medical History:  Diagnosis Date   Arthritis    Chronic sinusitis    Generalized headaches    GERD (gastroesophageal reflux disease)    Hypothyroidism     Past Surgical History:  Procedure Laterality Date   ANTERIOR CERVICAL DECOMP/DISCECTOMY FUSION N/A 08/28/2017   Procedure: ANTERIOR CERVICAL DECOMPRESSION AND FUSION CERVICAL FIVE-SIX,CERVICAL SIX-SEVEN;  Surgeon: Earnie Larsson, MD;  Location: [place];  Service: Neurosurgery;  Laterality: N/A;  anterior   BIOPSY  01/24/2020   Procedure: BIOPSY;  Surgeon: Daneil Dolin, MD;  [facility];  Service: Endoscopy;;  gastric polyp; gastric mucosa   CARPAL TUNNEL RELEASE Right 2001   CATARACT EXTRACTION W/PHACO Right 08/08/2017   Procedure: CATARACT EXTRACTION PHACO AND INTRAOCULAR LENS PLACEMENT ([place]);  Surgeon: Rutherford Guys, MD;  [facility]: [facility];  Service: Ophthalmology;  Laterality: Right;  CDE: 4.18   CATARACT EXTRACTION W/PHACO Left 11/10/2017   Procedure: CATARACT EXTRACTION PHACO AND INTRAOCULAR LENS PLACEMENT ([facility]);  Surgeon: Rutherford Guys, MD;  Location: [facility];  Service: Ophthalmology;  Laterality: Left;  CDE: 3.27   CHOLECYSTECTOMY     COLONOSCOPY     COLONOSCOPY N/A 04/03/2020   Procedure: COLONOSCOPY;  Surgeon: Daneil Dolin, MD;  [facility];  Service: Endoscopy;  Laterality: N/A;  10:00am   CYST EXCISION     right eye   ESOPHAGOGASTRODUODENOSCOPY N/A 01/24/2020   Procedure: ESOPHAGOGASTRODUODENOSCOPY (EGD);  Surgeon: Daneil Dolin, MD;  [facility];  Service: Endoscopy;  Laterality: N/A;  2:00pm   EYE SURGERY     HAMMER TOE SURGERY Bilateral    NASAL/SINUS ENDOSCOPY     TUBAL LIGATION     tubal reversal  1991    Current Outpatient Medications  Medication Sig Dispense Refill   acetaminophen (TYLENOL) 500 MG tablet Take 1,000 mg by mouth as needed for moderate pain or headache.     Calcium Carb-Cholecalciferol (CALCIUM 600+D3 PO) Take 2 tablets by mouth every morning.      Carboxymethylcellulose Sodium (REFRESH TEARS OP) Place 1 application into both eyes as needed (dry eyes).      docusate sodium (COLACE) 100 MG capsule Take 200 mg by mouth at bedtime.     hydrocortisone (ANUSOL-HC) 2.5 % rectal cream Place 1 application rectally 2 (two) times daily as needed for hemorrhoids or anal  itching. 30 g 1   Hypertonic Nasal Wash (SINUS RINSE NA) Place 1 each into the nose 2 (two) times daily.     levothyroxine (SYNTHROID, LEVOTHROID) 50 MCG tablet Take 50 mcg by mouth daily before breakfast.     linaclotide (LINZESS) 145 MCG CAPS capsule Take 1 capsule (145 mcg total) by mouth daily before breakfast. 90 capsule 3   miconazole (MONISTAT 7) 2 % vaginal cream Place 1 Applicatorful vaginally 2 (two) times daily.     omeprazole (PRILOSEC) 40 MG capsule TAKE 1 CAPSULE TWICE A  DAY 180 capsule 3   ondansetron (ZOFRAN) 4 MG tablet Take 1 tablet (4 mg total) by mouth every 8 (eight) hours as needed for nausea or vomiting. (Patient taking differently: Take 4 mg by mouth as needed for nausea or vomiting.) 30 tablet 1   predniSONE (DELTASONE) 5 MG tablet Take 5 mg by mouth daily with breakfast.     Probiotic Product (PROBIOTIC DAILY) CAPS Take 1 capsule by mouth daily.     progesterone (PROMETRIUM) 100 MG capsule Take 100 mg by mouth at bedtime.      venlafaxine XR (EFFEXOR-XR) 75 MG 24 hr capsule Take 150 mg by mouth at bedtime.     Wheat Dextrin (BENEFIBER DRINK MIX PO) Take by mouth. 1 Tbsp daily     Bromfenac Sodium 0.07 % SOLN Apply 2 drops to eye 2 (two) times daily. (Patient not taking: Reported on 06/22/2021)     diphenhydrAMINE (BENADRYL) 25 MG tablet Take 25 mg by mouth every 6 (six) hours as needed. (Patient not taking: Reported on 06/22/2021)     estradiol (EVAMIST) 1.53 MG/SPRAY transdermal spray Place 1 spray onto the skin at bedtime. (Patient not taking: Reported on 06/22/2021)     Fremanezumab-vfrm (AJOVY) 225 MG/1.5ML SOSY Inject into the skin every 30 (thirty) days. (Patient not taking: Reported on 06/22/2021)     No current facility-administered medications for this visit.    Allergies as of 06/22/2021 - Review Complete 06/22/2021  Allergen Reaction Noted   Zithromax [azithromycin] Hives and Rash 08/02/2017   Lifitegrast Itching 05/25/2018    Family History  Problem Relation Age of Onset   Other Mother    Other Father    Colon cancer Neg Hx     Social History   Socioeconomic History   Marital status: Married    Spouse name: Not on file   Number of children: Not on file   Years of education: Not on file   Highest education level: Not on file  Occupational History   Not on file  Tobacco Use   Smoking status: Never   Smokeless tobacco: Never  Vaping Use   Vaping Use: Never used  Substance and Sexual Activity   Alcohol use: No   Drug use: No    Sexual activity: Never  Other Topics Concern   Not on file  Social History Narrative   Not on file   Social Determinants of Health   Financial Resource Strain: Not on file  Food Insecurity: Not on file  Transportation Needs: Not on file  Physical Activity: Not on file  Stress: Not on file  Social Connections: Not on file    Review of Systems: Gen: Denies fever, chills, anorexia. Denies fatigue, weakness, weight loss.  CV: Denies chest pain, palpitations, syncope, peripheral edema, and claudication. Resp: Denies dyspnea at rest, cough, wheezing, coughing up blood, and pleurisy. GI: see HPI Derm: Denies rash, itching, dry skin Psych: Denies depression, anxiety, memory loss,  confusion. No homicidal or suicidal ideation.  Heme: Denies bruising, bleeding, and enlarged lymph nodes.  Physical Exam: BP (!) 153/92   Pulse (!) 106   Temp (!) 97.3 F (36.3 C) (Temporal)   Ht '5\' 3"'$  (1.6 m)   Wt 154 lb 12.8 oz (70.2 kg)   BMI 27.42 kg/m  General:   Alert and oriented. No distress noted. Pleasant and cooperative.  Head:  Normocephalic and atraumatic. Eyes:  Conjuctiva clear without scleral icterus. Mouth:  mask in place Abdomen:  +BS, soft, non-tender and non-distended. No rebound or guarding. No HSM or masses noted. Msk:  Symmetrical without gross deformities. Normal posture. Extremities:  Without edema. Neurologic:  Alert and  oriented x4 Psych:  Alert and cooperative. Normal mood and affect.  ASSESSMENT/PLAN:  Burnett Womac is a 70 y.o. female presenting today with a history of pancolonic diverticulosis, Grade 2 hemorrhoids, s/p banding of left lateral hemorrhoid 07/10/20. She presented to the ED at [facility] 07/26/20 with acute onset large volume rectal bleeding with clots. In review of her labs, it does show a decrease in Hgb from her baseline. She denied any aspirin, Ibuprofen, on no anticoagulants or anti-platelets. She is on low-dose prednisone for many years. I suspect she  had a delayed bleed after sloughing of the scab that would occur around the 10-14 day mark. Unable to rule out diverticular origin.  Declining further attempts at banding. She has occasional bleeding but not significant, so I feel it is best to continue conservative measures. We will titrate fiber down to every other day as she notes some bloating, gas, "overactive" gut feeling. Will continue Linzess 145 mcg daily. I have asked her to limit Anusol to only as needed, as she is currently using daily. Return in 6-8 months.   Annitta Needs, PhD, ANP-BC [facility]

## 2021-07-01 ENCOUNTER — Other Ambulatory Visit: Payer: Self-pay | Admitting: Obstetrics & Gynecology

## 2021-07-01 DIAGNOSIS — D250 Submucous leiomyoma of uterus: Secondary | ICD-10-CM

## 2021-07-14 ENCOUNTER — Ambulatory Visit
Admission: RE | Admit: 2021-07-14 | Discharge: 2021-07-14 | Disposition: A | Discharge status: Home / Self Care | Payer: Medicare Other | Source: Ambulatory Visit | Attending: Obstetrics & Gynecology | Admitting: Obstetrics & Gynecology

## 2021-07-14 ENCOUNTER — Encounter: Payer: Self-pay | Admitting: *Deleted

## 2021-07-14 ENCOUNTER — Other Ambulatory Visit: Payer: Self-pay

## 2021-07-14 DIAGNOSIS — D250 Submucous leiomyoma of uterus: Secondary | ICD-10-CM

## 2021-07-14 DIAGNOSIS — D259 Leiomyoma of uterus, unspecified: Secondary | ICD-10-CM | POA: Diagnosis not present

## 2021-07-14 HISTORY — PX: IR RADIOLOGIST EVAL & MGMT: IMG5224

## 2021-07-14 NOTE — Consult Note (Signed)
Chief Complaint:  Uterine fibroids  Referring Physician(s): Riddick,Gwendolyn Lavonne  History of Present Illness: Brittany Shields is a 70 y.o. female with chronic daily symptomatic hot flashes.  Approximately 8 months ago, she was started on hormone replacement therapy.  This was in attempt to improve her hot flashes.  The HRT was increased after approximately 3 to 4 months of treatment in April.  Following the increased dose, she began having daily vaginal bleeding and was concerned her period was returning.  As a result, the hormone replacement therapy was stopped.  The vaginal bleeding has also stopped.  Included in the work-up, the patient has had a normal Pap smear.  In late May 2022, the endometrial biopsy demonstrated inactive endometrium with stromal breakdown no hyperplasia or malignancy.  Following this an ultrasound was performed which demonstrated uterine size of 9 cm with at least 3 small fibroids largest measuring 2.6 cm in greatest dimension.  These are heterogeneous by ultrasound and may have internal calcification.  She is referred today for uterine fibroid embolization assessment.  Past Medical History:  Diagnosis Date   Arthritis    Chronic sinusitis    Generalized headaches    GERD (gastroesophageal reflux disease)    Hypothyroidism     Past Surgical History:  Procedure Laterality Date   ANTERIOR CERVICAL DECOMP/DISCECTOMY FUSION N/A 08/28/2017   Procedure: ANTERIOR CERVICAL DECOMPRESSION AND FUSION CERVICAL FIVE-SIX,CERVICAL SIX-SEVEN;  Surgeon: Earnie Larsson, MD;  Location: [place];  Service: Neurosurgery;  Laterality: N/A;  anterior   BIOPSY  01/24/2020   Procedure: BIOPSY;  Surgeon: Daneil Dolin, MD;  [facility];  Service: Endoscopy;;  gastric polyp; gastric mucosa   CARPAL TUNNEL RELEASE Right 2001   CATARACT EXTRACTION W/PHACO Right 08/08/2017   Procedure: CATARACT EXTRACTION PHACO AND INTRAOCULAR LENS PLACEMENT ([place]);  Surgeon: Rutherford Guys, MD;  [facility];  Service: Ophthalmology;  Laterality: Right;  CDE: 4.18   CATARACT EXTRACTION W/PHACO Left 11/10/2017   Procedure: CATARACT EXTRACTION PHACO AND INTRAOCULAR LENS PLACEMENT ([facility]);  Surgeon: Rutherford Guys, MD;  [facility];  Service: Ophthalmology;  Laterality: Left;  CDE: 3.27   CHOLECYSTECTOMY     COLONOSCOPY     COLONOSCOPY N/A 04/03/2020   Procedure: COLONOSCOPY;  Surgeon: Daneil Dolin, MD;  [facility];  Service: Endoscopy;  Laterality: N/A;  10:00am   CYST EXCISION     right eye   ESOPHAGOGASTRODUODENOSCOPY N/A 01/24/2020   Procedure: ESOPHAGOGASTRODUODENOSCOPY (EGD);  Surgeon: Daneil Dolin, MD;  [facility];  Service: Endoscopy;  Laterality: N/A;  2:00pm   EYE SURGERY     HAMMER TOE SURGERY Bilateral    IR RADIOLOGIST EVAL & MGMT  07/14/2021   NASAL/SINUS ENDOSCOPY     TUBAL LIGATION     tubal reversal  1991    Allergies: Zithromax [azithromycin] and Lifitegrast  Medications: Prior to Admission medications   Medication Sig Start Date End Date Taking? Authorizing Provider  acetaminophen (TYLENOL) 500 MG tablet Take 1,000 mg by mouth as needed for moderate pain or headache.    [provider]  Calcium Carb-Cholecalciferol (CALCIUM 600+D3 PO) Take 2 tablets by mouth every morning.     [provider]  Carboxymethylcellulose Sodium (REFRESH TEARS OP) Place 1 application into both eyes as needed (dry eyes).     [provider]  docusate sodium (COLACE) 100 MG capsule Take 200 mg by mouth at bedtime.    [provider]  hydrocortisone (ANUSOL-HC) 2.5 % rectal cream Place 1 application rectally 2 (two) times daily as needed for hemorrhoids or anal itching. 12/22/20   Annitta Needs, NP  Hypertonic Nasal Wash (SINUS RINSE NA) Place 1 each into the nose 2 (two) times daily.    [provider]  levothyroxine (SYNTHROID, LEVOTHROID) 50 MCG tablet Take 50 mcg by mouth daily before breakfast.     [provider]  linaclotide (LINZESS) 145 MCG CAPS capsule Take 1 capsule (145 mcg total) by mouth daily before breakfast. 06/22/21   Annitta Needs, NP  miconazole (MONISTAT 7) 2 % vaginal cream Place 1 Applicatorful vaginally 2 (two) times daily.    [provider]  omeprazole (PRILOSEC) 40 MG capsule TAKE 1 CAPSULE TWICE A DAY 05/18/21   Annitta Needs, NP  ondansetron (ZOFRAN) 4 MG tablet Take 1 tablet (4 mg total) by mouth every 8 (eight) hours as needed for nausea or vomiting. Patient taking differently: Take 4 mg by mouth as needed for nausea or vomiting. 11/21/19   Carlis Stable, NP  predniSONE (DELTASONE) 5 MG tablet Take 5 mg by mouth daily with breakfast.    [provider]  Probiotic Product (PROBIOTIC DAILY) CAPS Take 1 capsule by mouth daily.    [provider]  venlafaxine XR (EFFEXOR-XR) 75 MG 24 hr capsule Take 150 mg by mouth at bedtime.    [provider]  Wheat Dextrin (BENEFIBER DRINK MIX PO) Take by mouth. 1 Tbsp daily    [provider]     Family History  Problem Relation Age of Onset   Other Mother    Other Father    Colon cancer Neg Hx     Social History   Socioeconomic History   Marital status: Married    Spouse name: Not on file   Number of children: Not on file   Years of education: Not on file   Highest education level: Not on file  Occupational History   Not on file  Tobacco Use   Smoking status: Never   Smokeless tobacco: Never  Vaping Use   Vaping Use: Never used  Substance and Sexual Activity   Alcohol use: No   Drug use: No   Sexual activity: Never  Other Topics Concern   Not on file  Social History Narrative   Not on file   Social Determinants of Health   Financial Resource Strain: Not on file  Food Insecurity: Not on file  Transportation Needs: Not on file  Physical Activity: Not on file  Stress: Not on file  Social Connections: Not on file     Review of Systems: A 12 point ROS  discussed and pertinent positives are indicated in the HPI above.  All other systems are negative.  Review of Systems  Vital Signs: BP (!) 159/84 (BP Location: Left Arm)   Pulse 98   SpO2 95%   Physical Exam, deferred today  Imaging: IR Radiologist Eval & Mgmt  Result Date: 07/14/2021 Please refer to notes tab for details about interventional procedure. (Op Note)   Labs:  CBC: No results for input(s): WBC, HGB, HCT, PLT in the last 8760 hours.  COAGS: No results for input(s): INR, APTT in the last 8760 hours.  BMP: No results for input(s): NA, K, CL, CO2, GLUCOSE, BUN, CALCIUM, CREATININE, GFRNONAA, GFRAA in the last 8760 hours.  Invalid input(s): CMP  LIVER FUNCTION TESTS: No results for input(s): BILITOT, AST, ALT, ALKPHOS, PROT, ALBUMIN in the  last 8760 hours.    Assessment and Plan:  70 year old postmenopausal female with episodic vaginal bleeding brought on by HRT for symptomatic hot flashes.  After discontinuing the HRT, the vaginal bleeding has resolved.  Endometrial biopsy has been negative for malignancy.  Ultrasound demonstrates normal-sized uterus with very small fibroids no larger than 2.6 cm.  At this age, being postmenopausal, and the small size, fibroids are likely degenerated and not vascularized.  Therefore, I do not think uterine fibroid embolization has a role in this scenario and would not recommend UFE.  Plan: Recommend outpatient GYN follow-up to consider hysterectomy for a more definitive treatment.  Thank you for this interesting consult.  I greatly enjoyed meeting Annalysa Billingsly and look forward to participating in their care.  A copy of this report was sent to the requesting provider on this date.  Electronically Signed: Greggory Keen 07/14/2021, 1:47 PM   I spent a total of  60 Minutes   in face to face in clinical consultation, greater than 50% of which was counseling/coordinating care for this patient with postmenopausal bleeding

## 2021-07-15 ENCOUNTER — Telehealth: Payer: Self-pay | Admitting: Internal Medicine

## 2021-07-15 DIAGNOSIS — K625 Hemorrhage of anus and rectum: Secondary | ICD-10-CM

## 2021-07-15 MED ORDER — HYDROCORTISONE (PERIANAL) 2.5 % EX CREA: 1.0000 "application " | TOPICAL_CREAM | Freq: Two times a day (BID) | CUTANEOUS | 1 refills | Status: DC | PRN

## 2021-07-15 NOTE — Telephone Encounter (Signed)
Completed.

## 2021-07-15 NOTE — Telephone Encounter (Signed)
Pt called to say that [facility] told her that they have faxed [place] twice for her Anusol Cream refill and hasn't heard back from [place]. Please advise. [phone]

## 2021-07-15 NOTE — Telephone Encounter (Signed)
Pt was made aware.  

## 2021-07-15 NOTE — Telephone Encounter (Signed)
Pt is requesting a refill on her Anusol Cream to be sent to [facility].

## 2021-08-04 DIAGNOSIS — N951 Menopausal and female climacteric states: Secondary | ICD-10-CM | POA: Diagnosis not present

## 2021-08-04 DIAGNOSIS — D251 Intramural leiomyoma of uterus: Secondary | ICD-10-CM | POA: Diagnosis not present

## 2021-08-12 DIAGNOSIS — N183 Chronic kidney disease, stage 3 unspecified: Secondary | ICD-10-CM | POA: Diagnosis not present

## 2021-08-12 DIAGNOSIS — E039 Hypothyroidism, unspecified: Secondary | ICD-10-CM | POA: Diagnosis not present

## 2021-08-12 DIAGNOSIS — E7849 Other hyperlipidemia: Secondary | ICD-10-CM | POA: Diagnosis not present

## 2021-08-12 DIAGNOSIS — K2100 Gastro-esophageal reflux disease with esophagitis, without bleeding: Secondary | ICD-10-CM | POA: Diagnosis not present

## 2021-08-12 DIAGNOSIS — E782 Mixed hyperlipidemia: Secondary | ICD-10-CM | POA: Diagnosis not present

## 2021-08-17 DIAGNOSIS — M797 Fibromyalgia: Secondary | ICD-10-CM | POA: Diagnosis not present

## 2021-08-17 DIAGNOSIS — M3130 Wegener's granulomatosis without renal involvement: Secondary | ICD-10-CM | POA: Diagnosis not present

## 2021-08-17 DIAGNOSIS — E7849 Other hyperlipidemia: Secondary | ICD-10-CM | POA: Diagnosis not present

## 2021-08-17 DIAGNOSIS — E039 Hypothyroidism, unspecified: Secondary | ICD-10-CM | POA: Diagnosis not present

## 2021-08-17 DIAGNOSIS — K2100 Gastro-esophageal reflux disease with esophagitis, without bleeding: Secondary | ICD-10-CM | POA: Diagnosis not present

## 2021-08-17 DIAGNOSIS — Z0001 Encounter for general adult medical examination with abnormal findings: Secondary | ICD-10-CM | POA: Diagnosis not present

## 2021-08-17 DIAGNOSIS — G43909 Migraine, unspecified, not intractable, without status migrainosus: Secondary | ICD-10-CM | POA: Diagnosis not present

## 2021-08-17 DIAGNOSIS — Z23 Encounter for immunization: Secondary | ICD-10-CM | POA: Diagnosis not present

## 2021-08-19 DIAGNOSIS — M791 Myalgia, unspecified site: Secondary | ICD-10-CM | POA: Diagnosis not present

## 2021-08-19 DIAGNOSIS — E039 Hypothyroidism, unspecified: Secondary | ICD-10-CM | POA: Diagnosis not present

## 2021-08-19 DIAGNOSIS — R42 Dizziness and giddiness: Secondary | ICD-10-CM | POA: Diagnosis not present

## 2021-08-19 DIAGNOSIS — T50B95A Adverse effect of other viral vaccines, initial encounter: Secondary | ICD-10-CM | POA: Diagnosis not present

## 2021-08-19 DIAGNOSIS — R531 Weakness: Secondary | ICD-10-CM | POA: Diagnosis not present

## 2021-08-19 DIAGNOSIS — Z79890 Hormone replacement therapy (postmenopausal): Secondary | ICD-10-CM | POA: Diagnosis not present

## 2021-08-19 DIAGNOSIS — R112 Nausea with vomiting, unspecified: Secondary | ICD-10-CM | POA: Diagnosis not present

## 2021-08-19 DIAGNOSIS — F321 Major depressive disorder, single episode, moderate: Secondary | ICD-10-CM | POA: Diagnosis not present

## 2021-08-19 DIAGNOSIS — Z881 Allergy status to other antibiotic agents status: Secondary | ICD-10-CM | POA: Diagnosis not present

## 2021-08-19 DIAGNOSIS — Z20822 Contact with and (suspected) exposure to covid-19: Secondary | ICD-10-CM | POA: Diagnosis not present

## 2021-08-19 DIAGNOSIS — Z79899 Other long term (current) drug therapy: Secondary | ICD-10-CM | POA: Diagnosis not present

## 2021-08-19 DIAGNOSIS — B349 Viral infection, unspecified: Secondary | ICD-10-CM | POA: Diagnosis not present

## 2021-08-23 DIAGNOSIS — Z20828 Contact with and (suspected) exposure to other viral communicable diseases: Secondary | ICD-10-CM | POA: Diagnosis not present

## 2021-08-23 DIAGNOSIS — R112 Nausea with vomiting, unspecified: Secondary | ICD-10-CM | POA: Diagnosis not present

## 2021-08-23 DIAGNOSIS — R748 Abnormal levels of other serum enzymes: Secondary | ICD-10-CM | POA: Diagnosis not present

## 2021-08-23 DIAGNOSIS — R1013 Epigastric pain: Secondary | ICD-10-CM | POA: Diagnosis not present

## 2021-08-25 DIAGNOSIS — H0288B Meibomian gland dysfunction left eye, upper and lower eyelids: Secondary | ICD-10-CM | POA: Diagnosis not present

## 2021-08-25 DIAGNOSIS — Z961 Presence of intraocular lens: Secondary | ICD-10-CM | POA: Diagnosis not present

## 2021-08-25 DIAGNOSIS — H0288A Meibomian gland dysfunction right eye, upper and lower eyelids: Secondary | ICD-10-CM | POA: Diagnosis not present

## 2021-08-25 DIAGNOSIS — H04123 Dry eye syndrome of bilateral lacrimal glands: Secondary | ICD-10-CM | POA: Diagnosis not present

## 2021-08-25 DIAGNOSIS — H18419 Arcus senilis, unspecified eye: Secondary | ICD-10-CM | POA: Diagnosis not present

## 2021-09-02 DIAGNOSIS — R1013 Epigastric pain: Secondary | ICD-10-CM | POA: Diagnosis not present

## 2021-09-02 DIAGNOSIS — R112 Nausea with vomiting, unspecified: Secondary | ICD-10-CM | POA: Diagnosis not present

## 2021-09-02 DIAGNOSIS — Z6826 Body mass index (BMI) 26.0-26.9, adult: Secondary | ICD-10-CM | POA: Diagnosis not present

## 2021-09-07 DIAGNOSIS — N281 Cyst of kidney, acquired: Secondary | ICD-10-CM | POA: Diagnosis not present

## 2021-09-07 DIAGNOSIS — Z9049 Acquired absence of other specified parts of digestive tract: Secondary | ICD-10-CM | POA: Diagnosis not present

## 2021-09-17 DIAGNOSIS — Z6824 Body mass index (BMI) 24.0-24.9, adult: Secondary | ICD-10-CM | POA: Diagnosis not present

## 2021-09-17 DIAGNOSIS — R1013 Epigastric pain: Secondary | ICD-10-CM | POA: Diagnosis not present

## 2021-09-17 DIAGNOSIS — R112 Nausea with vomiting, unspecified: Secondary | ICD-10-CM | POA: Diagnosis not present

## 2021-09-17 DIAGNOSIS — R634 Abnormal weight loss: Secondary | ICD-10-CM | POA: Diagnosis not present

## 2021-09-23 DIAGNOSIS — Z20828 Contact with and (suspected) exposure to other viral communicable diseases: Secondary | ICD-10-CM | POA: Diagnosis not present

## 2021-09-24 DIAGNOSIS — N281 Cyst of kidney, acquired: Secondary | ICD-10-CM | POA: Diagnosis not present

## 2021-09-24 DIAGNOSIS — N200 Calculus of kidney: Secondary | ICD-10-CM | POA: Diagnosis not present

## 2021-09-24 DIAGNOSIS — Z9049 Acquired absence of other specified parts of digestive tract: Secondary | ICD-10-CM | POA: Diagnosis not present

## 2021-09-24 DIAGNOSIS — R634 Abnormal weight loss: Secondary | ICD-10-CM | POA: Diagnosis not present

## 2021-09-24 DIAGNOSIS — R1110 Vomiting, unspecified: Secondary | ICD-10-CM | POA: Diagnosis not present

## 2021-09-24 DIAGNOSIS — I700 Atherosclerosis of aorta: Secondary | ICD-10-CM | POA: Diagnosis not present

## 2021-09-24 DIAGNOSIS — K760 Fatty (change of) liver, not elsewhere classified: Secondary | ICD-10-CM | POA: Diagnosis not present

## 2021-09-29 DIAGNOSIS — N6489 Other specified disorders of breast: Secondary | ICD-10-CM | POA: Diagnosis not present

## 2021-09-29 DIAGNOSIS — R922 Inconclusive mammogram: Secondary | ICD-10-CM | POA: Diagnosis not present

## 2021-10-08 DIAGNOSIS — E611 Iron deficiency: Secondary | ICD-10-CM | POA: Diagnosis not present

## 2021-10-08 DIAGNOSIS — Z6825 Body mass index (BMI) 25.0-25.9, adult: Secondary | ICD-10-CM | POA: Diagnosis not present

## 2021-10-08 DIAGNOSIS — E538 Deficiency of other specified B group vitamins: Secondary | ICD-10-CM | POA: Diagnosis not present

## 2021-10-08 DIAGNOSIS — D509 Iron deficiency anemia, unspecified: Secondary | ICD-10-CM | POA: Diagnosis not present

## 2021-10-08 DIAGNOSIS — G629 Polyneuropathy, unspecified: Secondary | ICD-10-CM | POA: Diagnosis not present

## 2021-10-11 DIAGNOSIS — E538 Deficiency of other specified B group vitamins: Secondary | ICD-10-CM | POA: Diagnosis not present

## 2021-10-13 DIAGNOSIS — M1711 Unilateral primary osteoarthritis, right knee: Secondary | ICD-10-CM | POA: Diagnosis not present

## 2021-10-13 DIAGNOSIS — M797 Fibromyalgia: Secondary | ICD-10-CM | POA: Diagnosis not present

## 2021-10-13 DIAGNOSIS — N183 Chronic kidney disease, stage 3 unspecified: Secondary | ICD-10-CM | POA: Diagnosis not present

## 2021-10-13 DIAGNOSIS — E039 Hypothyroidism, unspecified: Secondary | ICD-10-CM | POA: Diagnosis not present

## 2021-10-18 DIAGNOSIS — E538 Deficiency of other specified B group vitamins: Secondary | ICD-10-CM | POA: Diagnosis not present

## 2021-10-19 DIAGNOSIS — J328 Other chronic sinusitis: Secondary | ICD-10-CM | POA: Diagnosis not present

## 2021-10-19 DIAGNOSIS — Z79899 Other long term (current) drug therapy: Secondary | ICD-10-CM | POA: Diagnosis not present

## 2021-10-19 DIAGNOSIS — J310 Chronic rhinitis: Secondary | ICD-10-CM | POA: Diagnosis not present

## 2021-10-19 DIAGNOSIS — Z9889 Other specified postprocedural states: Secondary | ICD-10-CM | POA: Diagnosis not present

## 2021-10-19 DIAGNOSIS — Z881 Allergy status to other antibiotic agents status: Secondary | ICD-10-CM | POA: Diagnosis not present

## 2021-10-19 DIAGNOSIS — R0982 Postnasal drip: Secondary | ICD-10-CM | POA: Diagnosis not present

## 2021-10-19 DIAGNOSIS — K219 Gastro-esophageal reflux disease without esophagitis: Secondary | ICD-10-CM | POA: Diagnosis not present

## 2021-10-19 DIAGNOSIS — Z888 Allergy status to other drugs, medicaments and biological substances status: Secondary | ICD-10-CM | POA: Diagnosis not present

## 2021-10-19 DIAGNOSIS — J329 Chronic sinusitis, unspecified: Secondary | ICD-10-CM | POA: Diagnosis not present

## 2021-10-19 DIAGNOSIS — Z7951 Long term (current) use of inhaled steroids: Secondary | ICD-10-CM | POA: Diagnosis not present

## 2021-10-25 DIAGNOSIS — E538 Deficiency of other specified B group vitamins: Secondary | ICD-10-CM | POA: Diagnosis not present

## 2021-11-01 DIAGNOSIS — E538 Deficiency of other specified B group vitamins: Secondary | ICD-10-CM | POA: Diagnosis not present

## 2021-11-15 DIAGNOSIS — R300 Dysuria: Secondary | ICD-10-CM | POA: Diagnosis not present

## 2021-11-15 DIAGNOSIS — Z6826 Body mass index (BMI) 26.0-26.9, adult: Secondary | ICD-10-CM | POA: Diagnosis not present

## 2021-11-15 DIAGNOSIS — M7060 Trochanteric bursitis, unspecified hip: Secondary | ICD-10-CM | POA: Diagnosis not present

## 2021-11-15 DIAGNOSIS — M170 Bilateral primary osteoarthritis of knee: Secondary | ICD-10-CM | POA: Diagnosis not present

## 2021-11-20 DIAGNOSIS — Z6825 Body mass index (BMI) 25.0-25.9, adult: Secondary | ICD-10-CM | POA: Diagnosis not present

## 2021-11-20 DIAGNOSIS — R300 Dysuria: Secondary | ICD-10-CM | POA: Diagnosis not present

## 2021-11-20 DIAGNOSIS — M1711 Unilateral primary osteoarthritis, right knee: Secondary | ICD-10-CM | POA: Diagnosis not present

## 2021-11-20 DIAGNOSIS — Z20828 Contact with and (suspected) exposure to other viral communicable diseases: Secondary | ICD-10-CM | POA: Diagnosis not present

## 2021-11-20 DIAGNOSIS — J0101 Acute recurrent maxillary sinusitis: Secondary | ICD-10-CM | POA: Diagnosis not present

## 2021-11-20 DIAGNOSIS — R059 Cough, unspecified: Secondary | ICD-10-CM | POA: Diagnosis not present

## 2021-11-23 DIAGNOSIS — Z1612 Extended spectrum beta lactamase (ESBL) resistance: Secondary | ICD-10-CM | POA: Diagnosis not present

## 2021-11-23 DIAGNOSIS — J069 Acute upper respiratory infection, unspecified: Secondary | ICD-10-CM | POA: Diagnosis not present

## 2021-11-23 DIAGNOSIS — R300 Dysuria: Secondary | ICD-10-CM | POA: Diagnosis not present

## 2021-11-23 DIAGNOSIS — Z20828 Contact with and (suspected) exposure to other viral communicable diseases: Secondary | ICD-10-CM | POA: Diagnosis not present

## 2021-11-23 DIAGNOSIS — A499 Bacterial infection, unspecified: Secondary | ICD-10-CM | POA: Diagnosis not present

## 2021-12-02 DIAGNOSIS — E538 Deficiency of other specified B group vitamins: Secondary | ICD-10-CM | POA: Diagnosis not present

## 2021-12-13 ENCOUNTER — Telehealth: Payer: Self-pay

## 2021-12-13 DIAGNOSIS — D529 Folate deficiency anemia, unspecified: Secondary | ICD-10-CM | POA: Diagnosis not present

## 2021-12-13 DIAGNOSIS — E7849 Other hyperlipidemia: Secondary | ICD-10-CM | POA: Diagnosis not present

## 2021-12-13 DIAGNOSIS — D649 Anemia, unspecified: Secondary | ICD-10-CM | POA: Diagnosis not present

## 2021-12-13 DIAGNOSIS — K2100 Gastro-esophageal reflux disease with esophagitis, without bleeding: Secondary | ICD-10-CM | POA: Diagnosis not present

## 2021-12-13 DIAGNOSIS — N183 Chronic kidney disease, stage 3 unspecified: Secondary | ICD-10-CM | POA: Diagnosis not present

## 2021-12-13 DIAGNOSIS — E782 Mixed hyperlipidemia: Secondary | ICD-10-CM | POA: Diagnosis not present

## 2021-12-13 DIAGNOSIS — D519 Vitamin B12 deficiency anemia, unspecified: Secondary | ICD-10-CM | POA: Diagnosis not present

## 2021-12-13 DIAGNOSIS — E039 Hypothyroidism, unspecified: Secondary | ICD-10-CM | POA: Diagnosis not present

## 2021-12-13 NOTE — Telephone Encounter (Signed)
Will have Roseanne Kaufman sign off on before faxing.

## 2021-12-13 NOTE — Telephone Encounter (Signed)
Pt approved for her Linzess. It is effective now until 12/13/2022. Will phone and advise the pt of this to advise her pharmacy to run it again because she had 2 different pharmacies and I didn't know which one she wanted it to go to. Will give to Manuela Schwartz to scan in.

## 2021-12-13 NOTE — Telephone Encounter (Signed)
PA completed with [employer] PA request form for Linaclotide (Linzess)

## 2021-12-16 DIAGNOSIS — E039 Hypothyroidism, unspecified: Secondary | ICD-10-CM | POA: Diagnosis not present

## 2021-12-16 DIAGNOSIS — R059 Cough, unspecified: Secondary | ICD-10-CM | POA: Diagnosis not present

## 2021-12-16 DIAGNOSIS — M3130 Wegener's granulomatosis without renal involvement: Secondary | ICD-10-CM | POA: Diagnosis not present

## 2021-12-16 DIAGNOSIS — B370 Candidal stomatitis: Secondary | ICD-10-CM | POA: Diagnosis not present

## 2021-12-16 DIAGNOSIS — M797 Fibromyalgia: Secondary | ICD-10-CM | POA: Diagnosis not present

## 2021-12-16 DIAGNOSIS — Z6826 Body mass index (BMI) 26.0-26.9, adult: Secondary | ICD-10-CM | POA: Diagnosis not present

## 2021-12-16 DIAGNOSIS — E7849 Other hyperlipidemia: Secondary | ICD-10-CM | POA: Diagnosis not present

## 2021-12-16 DIAGNOSIS — G43909 Migraine, unspecified, not intractable, without status migrainosus: Secondary | ICD-10-CM | POA: Diagnosis not present

## 2022-01-03 DIAGNOSIS — E538 Deficiency of other specified B group vitamins: Secondary | ICD-10-CM | POA: Diagnosis not present

## 2022-01-12 DIAGNOSIS — Z20822 Contact with and (suspected) exposure to covid-19: Secondary | ICD-10-CM | POA: Diagnosis not present

## 2022-01-19 ENCOUNTER — Other Ambulatory Visit: Payer: Self-pay

## 2022-01-19 ENCOUNTER — Ambulatory Visit (INDEPENDENT_AMBULATORY_CARE_PROVIDER_SITE_OTHER): Payer: Medicare Other | Admitting: Gastroenterology

## 2022-01-19 ENCOUNTER — Encounter: Payer: Self-pay | Admitting: Gastroenterology

## 2022-01-19 VITALS — BP 124/76 | HR 97 | Temp 97.3°F | Ht 63.0 in | Wt 155.8 lb

## 2022-01-19 DIAGNOSIS — K219 Gastro-esophageal reflux disease without esophagitis: Secondary | ICD-10-CM | POA: Diagnosis not present

## 2022-01-19 DIAGNOSIS — K641 Second degree hemorrhoids: Secondary | ICD-10-CM

## 2022-01-19 DIAGNOSIS — K625 Hemorrhage of anus and rectum: Secondary | ICD-10-CM | POA: Diagnosis not present

## 2022-01-19 MED ORDER — LINACLOTIDE 145 MCG PO CAPS: 145.0000 ug | ORAL_CAPSULE | Freq: Every day | ORAL | 3 refills | Status: DC

## 2022-01-19 MED ORDER — HYDROCORTISONE (PERIANAL) 2.5 % EX CREA: 1.0000 "application " | TOPICAL_CREAM | Freq: Two times a day (BID) | CUTANEOUS | 3 refills | Status: DC | PRN

## 2022-01-19 MED ORDER — OMEPRAZOLE 40 MG PO CPDR: 40.0000 mg | DELAYED_RELEASE_CAPSULE | Freq: Two times a day (BID) | ORAL | 3 refills | Status: DC

## 2022-01-19 MED ORDER — RIFAXIMIN 550 MG PO TABS: 550.0000 mg | ORAL_TABLET | Freq: Three times a day (TID) | ORAL | 0 refills | Status: AC

## 2022-01-19 NOTE — Progress Notes (Signed)
Gastroenterology Office Note     Primary Care Physician:  Caryl Bis, MD  Primary Gastroenterologist: Dr. Gala Romney    Chief Complaint   Chief Complaint  Patient presents with   Hemorrhoids    Follow up, a little bit of bleeding seen today     History of Present Illness   Brittany Shields is a 71 y.o. female presenting today in follow-up with a history of pancolonic diverticulosis, Grade 2 hemorrhoids, s/p banding of left lateral hemorrhoid 07/10/20. She presented to the ED at [facility] 07/26/20 with acute onset large volume rectal bleeding with clots. In review of her labs, it does show a decrease in Hgb from her baseline. She denied any aspirin, Ibuprofen, on no anticoagulants or anti-platelets. She is on low-dose prednisone for many years. I suspect she had a delayed bleed after sloughing of the scab that would occur around the 10-14 day mark. Unable to rule out diverticular origin. Here for routine visit.  Fiber every other day recommended due to gas at last visit. Linzess 145 mcg daily.   Hasn't noticed an improvement in gas and bloating with fiber every other day. BM every day. No straining. Only scant blood at times. No pressure. Her main concern is of abdominal bloating and significant gas. No diarrhea.   PPI BID works well for GERD. No dysphagia.       Past Medical History:  Diagnosis Date   Arthritis    Chronic sinusitis    Generalized headaches    GERD (gastroesophageal reflux disease)    Hypothyroidism     Past Surgical History:  Procedure Laterality Date   ANTERIOR CERVICAL DECOMP/DISCECTOMY FUSION N/A 08/28/2017   Procedure: ANTERIOR CERVICAL DECOMPRESSION AND FUSION CERVICAL FIVE-SIX,CERVICAL SIX-SEVEN;  Surgeon: Earnie Larsson, MD;  Location: [place];  Service: Neurosurgery;  Laterality: N/A;  anterior   BIOPSY  01/24/2020   Procedure: BIOPSY;  Surgeon: Daneil Dolin, MD;  [facility];  Service: Endoscopy;;  gastric polyp; gastric mucosa    CARPAL TUNNEL RELEASE Right 2001   CATARACT EXTRACTION W/PHACO Right 08/08/2017   Procedure: CATARACT EXTRACTION PHACO AND INTRAOCULAR LENS PLACEMENT ([place]);  Surgeon: Rutherford Guys, MD;  [facility];  Service: Ophthalmology;  Laterality: Right;  CDE: 4.18   CATARACT EXTRACTION W/PHACO Left 11/10/2017   Procedure: CATARACT EXTRACTION PHACO AND INTRAOCULAR LENS PLACEMENT ([facility]);  Surgeon: Rutherford Guys, MD;  [facility];  Service: Ophthalmology;  Laterality: Left;  CDE: 3.27   CHOLECYSTECTOMY     COLONOSCOPY     COLONOSCOPY N/A 04/03/2020   Procedure: COLONOSCOPY;  Surgeon: Daneil Dolin, MD;  [facility];  Service: Endoscopy;  Laterality: N/A;  10:00am   CYST EXCISION     right eye   ESOPHAGOGASTRODUODENOSCOPY N/A 01/24/2020   Procedure: ESOPHAGOGASTRODUODENOSCOPY (EGD);  Surgeon: Daneil Dolin, MD;  [facility];  Service: Endoscopy;  Laterality: N/A;  2:00pm   EYE SURGERY     HAMMER TOE SURGERY Bilateral    IR RADIOLOGIST EVAL & MGMT  07/14/2021   NASAL/SINUS ENDOSCOPY     TUBAL LIGATION     tubal reversal  1991    Current Outpatient Medications  Medication Sig Dispense Refill   acetaminophen (TYLENOL) 500 MG tablet Take 1,000 mg by mouth as needed for moderate pain or headache.     Calcium Carb-Cholecalciferol (CALCIUM 600+D3 PO) Take 2 tablets by mouth every morning.      Carboxymethylcellulose Sodium (REFRESH TEARS OP) Place  1 application into both eyes as needed (dry eyes).      Cyanocobalamin (B-12 COMPLIANCE INJECTION) 1000 MCG/ML KIT Inject 1,000 mcg as directed every 30 (thirty) days.     docusate sodium (COLACE) 100 MG capsule Take 200 mg by mouth at bedtime.     ferrous sulfate 324 MG TBEC Take 324 mg by mouth.     hydrocortisone (ANUSOL-HC) 2.5 % rectal cream Place 1 application rectally 2 (two) times daily as needed for hemorrhoids or anal itching. 30 g 1   Hypertonic Nasal Wash (SINUS RINSE NA) Place 1 each into the nose 2 (two) times  daily.     levothyroxine (SYNTHROID, LEVOTHROID) 50 MCG tablet Take 50 mcg by mouth daily before breakfast.     linaclotide (LINZESS) 145 MCG CAPS capsule Take 1 capsule (145 mcg total) by mouth daily before breakfast. 90 capsule 3   miconazole (MONISTAT 7) 2 % vaginal cream Place 1 Applicatorful vaginally 2 (two) times daily.     omeprazole (PRILOSEC) 40 MG capsule TAKE 1 CAPSULE TWICE A DAY 180 capsule 3   ondansetron (ZOFRAN) 4 MG tablet Take 1 tablet (4 mg total) by mouth every 8 (eight) hours as needed for nausea or vomiting. (Patient taking differently: Take 4 mg by mouth as needed for nausea or vomiting.) 30 tablet 1   predniSONE (DELTASONE) 5 MG tablet Take 5 mg by mouth daily with breakfast.     Probiotic Product (PROBIOTIC DAILY) CAPS Take 1 capsule by mouth daily.     venlafaxine XR (EFFEXOR-XR) 75 MG 24 hr capsule Take 150 mg by mouth at bedtime.     Wheat Dextrin (BENEFIBER DRINK MIX PO) Take by mouth. 1 Tbsp daily     No current facility-administered medications for this visit.    Allergies as of 01/19/2022 - Review Complete 01/19/2022  Allergen Reaction Noted   Zithromax [azithromycin] Hives and Rash 08/02/2017   Lifitegrast Itching 05/25/2018    Family History  Problem Relation Age of Onset   Other Mother    Other Father    Colon cancer Neg Hx     Social History   Socioeconomic History   Marital status: Married    Spouse name: Not on file   Number of children: Not on file   Years of education: Not on file   Highest education level: Not on file  Occupational History   Not on file  Tobacco Use   Smoking status: Never   Smokeless tobacco: Never  Vaping Use   Vaping Use: Never used  Substance and Sexual Activity   Alcohol use: No   Drug use: No   Sexual activity: Never  Other Topics Concern   Not on file  Social History Narrative   Not on file   Social Determinants of Health   Financial Resource Strain: Not on file  Food Insecurity: Not on file   Transportation Needs: Not on file  Physical Activity: Not on file  Stress: Not on file  Social Connections: Not on file  Intimate Partner Violence: Not on file     Review of Systems   Gen: Denies any fever, chills, fatigue, weight loss, lack of appetite.  CV: Denies chest pain, heart palpitations, peripheral edema, syncope.  Resp: Denies shortness of breath at rest or with exertion. Denies wheezing or cough.  GI: see HPI GU : Denies urinary burning, urinary frequency, urinary hesitancy MS: Denies joint pain, muscle weakness, cramps, or limitation of movement.  Derm: Denies rash, itching, dry skin  Psych: Denies depression, anxiety, memory loss, and confusion Heme: see HPI   Physical Exam   BP 124/76 (BP Location: Right Arm, Patient Position: Sitting, Cuff Size: Normal)    Pulse 97    Temp (!) 97.3 F (36.3 C) (Temporal)    Ht '5\' 3"'  (1.6 m)    Wt 155 lb 12.8 oz (70.7 kg)    SpO2 97%    BMI 27.60 kg/m  General:   Alert and oriented. Pleasant and cooperative. Well-nourished and well-developed.  Head:  Normocephalic and atraumatic. Eyes:  Without icterus Abdomen:  +BS, soft, non-tender and non-distended. No HSM noted. No guarding or rebound. No masses appreciated.  Rectal:  Deferred  Msk:  Symmetrical without gross deformities. Normal posture. Extremities:  Without edema. Neurologic:  Alert and  oriented x4;  grossly normal neurologically. Skin:  Intact without significant lesions or rashes. Psych:  Alert and cooperative. Normal mood and affect.   Assessment   Brittany Shields is a 71 y.o. female presenting today in follow-up with a history of GERD, constipation, gas/bloating, pancolonic diverticulosis, Grade 2 hemorrhoids, s/p banding of left lateral hemorrhoid 07/10/20 and subsequent large volume bleeding close to the 10-14 day mark post-banding. Unable to rule out diverticular origin but more likely bleed post-banding.    Hemorrhoids: scant bleeding. Continuing conservative  measures. Continue fiber.   Constipation: doing well on Linzess 145 mcg daily.  Abdominal bloating/gas: no improvement with decreasing fiber and despite good bowel regimen. Query bacterial overgrowth. Will trial Xifaxan TID For 14 weeks.   GERD: doing well on PPI BID. Unable to decrease to daily at this point.    PLAN    Conservative measures for hemorrhoids PPI BID Continue fiber Xifaxan TID for 14 days 6 month follow-up   Annitta Needs, PhD, ANP-BC [facility]

## 2022-01-19 NOTE — Patient Instructions (Signed)
I have sent in Xifaxan to take three times a day for 14 days. This is for bloating/gas. Let me know how this works for you! ? ?We will see you in 6 months! ? ?I enjoyed seeing you again today! As you know, I value our relationship and want to provide genuine, compassionate, and quality care. I welcome your feedback. If you receive a survey regarding your visit,  I greatly appreciate you taking time to fill this out. See you next time! ? ?Annitta Needs, PhD, ANP-BC ?[facility]  ? ?

## 2022-01-25 ENCOUNTER — Telehealth: Payer: Self-pay | Admitting: Internal Medicine

## 2022-01-25 NOTE — Telephone Encounter (Signed)
[phone] please call patient, Vicente Males was supposed to send a prescription in for her to [facility] and they said they were waiting on the doctor to sign off on it.  Please advise  ?

## 2022-01-25 NOTE — Telephone Encounter (Signed)
Phoned and advised the pt that we are waiting to hear from her insurance company regarding her PA. The pt expressed understanding. ?

## 2022-01-26 ENCOUNTER — Telehealth: Payer: Self-pay

## 2022-01-26 NOTE — Telephone Encounter (Signed)
The PA for Xifaxan has been denied by the patient's insurance. They will only pay for it if the patient has IBS-diarrhea.  ?

## 2022-01-26 NOTE — Telephone Encounter (Signed)
Pt was made aware to come by office to pick up samples.  ?

## 2022-01-26 NOTE — Telephone Encounter (Signed)
Do we have any samples here? Would need #[phone].  ?

## 2022-01-31 DIAGNOSIS — E538 Deficiency of other specified B group vitamins: Secondary | ICD-10-CM | POA: Diagnosis not present

## 2022-02-04 DIAGNOSIS — N951 Menopausal and female climacteric states: Secondary | ICD-10-CM | POA: Diagnosis not present

## 2022-02-04 DIAGNOSIS — Z8669 Personal history of other diseases of the nervous system and sense organs: Secondary | ICD-10-CM | POA: Diagnosis not present

## 2022-02-04 DIAGNOSIS — Z881 Allergy status to other antibiotic agents status: Secondary | ICD-10-CM | POA: Diagnosis not present

## 2022-02-04 DIAGNOSIS — Z888 Allergy status to other drugs, medicaments and biological substances status: Secondary | ICD-10-CM | POA: Diagnosis not present

## 2022-03-01 DIAGNOSIS — Z20822 Contact with and (suspected) exposure to covid-19: Secondary | ICD-10-CM | POA: Diagnosis not present

## 2022-03-04 DIAGNOSIS — E538 Deficiency of other specified B group vitamins: Secondary | ICD-10-CM | POA: Diagnosis not present

## 2022-03-15 DIAGNOSIS — N183 Chronic kidney disease, stage 3 unspecified: Secondary | ICD-10-CM | POA: Diagnosis not present

## 2022-03-15 DIAGNOSIS — E7849 Other hyperlipidemia: Secondary | ICD-10-CM | POA: Diagnosis not present

## 2022-03-15 DIAGNOSIS — E782 Mixed hyperlipidemia: Secondary | ICD-10-CM | POA: Diagnosis not present

## 2022-03-18 DIAGNOSIS — R059 Cough, unspecified: Secondary | ICD-10-CM | POA: Diagnosis not present

## 2022-03-18 DIAGNOSIS — R011 Cardiac murmur, unspecified: Secondary | ICD-10-CM | POA: Diagnosis not present

## 2022-03-18 DIAGNOSIS — E7849 Other hyperlipidemia: Secondary | ICD-10-CM | POA: Diagnosis not present

## 2022-03-18 DIAGNOSIS — M797 Fibromyalgia: Secondary | ICD-10-CM | POA: Diagnosis not present

## 2022-03-18 DIAGNOSIS — G43909 Migraine, unspecified, not intractable, without status migrainosus: Secondary | ICD-10-CM | POA: Diagnosis not present

## 2022-03-18 DIAGNOSIS — M3130 Wegener's granulomatosis without renal involvement: Secondary | ICD-10-CM | POA: Diagnosis not present

## 2022-03-18 DIAGNOSIS — M19041 Primary osteoarthritis, right hand: Secondary | ICD-10-CM | POA: Diagnosis not present

## 2022-03-18 DIAGNOSIS — M19042 Primary osteoarthritis, left hand: Secondary | ICD-10-CM | POA: Diagnosis not present

## 2022-03-25 DIAGNOSIS — Z79899 Other long term (current) drug therapy: Secondary | ICD-10-CM | POA: Diagnosis not present

## 2022-03-25 DIAGNOSIS — Z7952 Long term (current) use of systemic steroids: Secondary | ICD-10-CM | POA: Diagnosis not present

## 2022-03-25 DIAGNOSIS — N951 Menopausal and female climacteric states: Secondary | ICD-10-CM | POA: Diagnosis not present

## 2022-03-25 DIAGNOSIS — H348192 Central retinal vein occlusion, unspecified eye, stable: Secondary | ICD-10-CM | POA: Diagnosis not present

## 2022-03-28 DIAGNOSIS — I082 Rheumatic disorders of both aortic and tricuspid valves: Secondary | ICD-10-CM | POA: Diagnosis not present

## 2022-03-28 DIAGNOSIS — R011 Cardiac murmur, unspecified: Secondary | ICD-10-CM | POA: Diagnosis not present

## 2022-03-30 DIAGNOSIS — R928 Other abnormal and inconclusive findings on diagnostic imaging of breast: Secondary | ICD-10-CM | POA: Diagnosis not present

## 2022-04-04 DIAGNOSIS — E538 Deficiency of other specified B group vitamins: Secondary | ICD-10-CM | POA: Diagnosis not present

## 2022-05-04 DIAGNOSIS — E538 Deficiency of other specified B group vitamins: Secondary | ICD-10-CM | POA: Diagnosis not present

## 2022-05-05 ENCOUNTER — Encounter (INDEPENDENT_AMBULATORY_CARE_PROVIDER_SITE_OTHER): Payer: Self-pay | Admitting: Ophthalmology

## 2022-05-05 ENCOUNTER — Ambulatory Visit (INDEPENDENT_AMBULATORY_CARE_PROVIDER_SITE_OTHER): Payer: Medicare Other | Admitting: Ophthalmology

## 2022-05-05 DIAGNOSIS — H348112 Central retinal vein occlusion, right eye, stable: Secondary | ICD-10-CM

## 2022-05-05 DIAGNOSIS — H43812 Vitreous degeneration, left eye: Secondary | ICD-10-CM

## 2022-05-05 DIAGNOSIS — H35372 Puckering of macula, left eye: Secondary | ICD-10-CM | POA: Diagnosis not present

## 2022-05-05 DIAGNOSIS — H04123 Dry eye syndrome of bilateral lacrimal glands: Secondary | ICD-10-CM | POA: Diagnosis not present

## 2022-05-05 NOTE — Progress Notes (Signed)
05/05/2022     CHIEF COMPLAINT Patient presents for  Chief Complaint  Patient presents with   Retina Follow Up      HISTORY OF PRESENT ILLNESS: Brittany Shields is a 71 y.o. female who presents to the clinic today for:   HPI     Retina Follow Up           Diagnosis: CRVO/BRVO   Laterality: right eye   Onset: 1 year ago         Comments   1 yr fu OU OCT FP. Patient states vision is stable and unchanged since last visit. Denies any new floaters or FOL. Denies hospitalizations or surgeries in the past year.        Last edited by Laurin Coder on 05/05/2022  1:22 PM.      Referring physician: Caryl Bis, MD [place],  [place] [address]  HISTORICAL INFORMATION:   Selected notes from the MEDICAL RECORD NUMBER       CURRENT MEDICATIONS: Current Outpatient Medications (Ophthalmic Drugs)  Medication Sig   Carboxymethylcellulose Sodium (REFRESH TEARS OP) Place 1 application into both eyes as needed (dry eyes).    No current facility-administered medications for this visit. (Ophthalmic Drugs)   Current Outpatient Medications (Other)  Medication Sig   acetaminophen (TYLENOL) 500 MG tablet Take 1,000 mg by mouth as needed for moderate pain or headache.   Calcium Carb-Cholecalciferol (CALCIUM 600+D3 PO) Take 2 tablets by mouth every morning.    Cyanocobalamin (B-12 COMPLIANCE INJECTION) 1000 MCG/ML KIT Inject 1,000 mcg as directed every 30 (thirty) days.   docusate sodium (COLACE) 100 MG capsule Take 200 mg by mouth at bedtime.   ferrous sulfate 324 MG TBEC Take 324 mg by mouth.   hydrocortisone (ANUSOL-HC) 2.5 % rectal cream Place 1 application. rectally 2 (two) times daily as needed for hemorrhoids or anal itching.   Hypertonic Nasal Wash (SINUS RINSE NA) Place 1 each into the nose 2 (two) times daily.   levothyroxine (SYNTHROID, LEVOTHROID) 50 MCG tablet Take 50 mcg by mouth daily before breakfast.   linaclotide (LINZESS) 145 MCG CAPS capsule Take 1  capsule (145 mcg total) by mouth daily before breakfast.   miconazole (MONISTAT 7) 2 % vaginal cream Place 1 Applicatorful vaginally 2 (two) times daily.   omeprazole (PRILOSEC) 40 MG capsule Take 1 capsule (40 mg total) by mouth 2 (two) times daily.   ondansetron (ZOFRAN) 4 MG tablet Take 1 tablet (4 mg total) by mouth every 8 (eight) hours as needed for nausea or vomiting. (Patient taking differently: Take 4 mg by mouth as needed for nausea or vomiting.)   predniSONE (DELTASONE) 5 MG tablet Take 5 mg by mouth daily with breakfast.   pregabalin (LYRICA) 200 MG capsule Take 200 mg by mouth daily.   Probiotic Product (PROBIOTIC DAILY) CAPS Take 1 capsule by mouth daily.   venlafaxine XR (EFFEXOR-XR) 75 MG 24 hr capsule Take 150 mg by mouth at bedtime.   Wheat Dextrin (BENEFIBER DRINK MIX PO) Take by mouth. 1 Tbsp daily   No current facility-administered medications for this visit. (Other)      REVIEW OF SYSTEMS: ROS   Negative for: Constitutional, Gastrointestinal, Neurological, Skin, Genitourinary, Musculoskeletal, HENT, Endocrine, Cardiovascular, Eyes, Respiratory, Psychiatric, Allergic/Imm, Heme/Lymph Last edited by Hurman Horn, MD on 05/05/2022  1:54 PM.       ALLERGIES Allergies  Allergen Reactions   Zithromax [Azithromycin] Hives and Rash   Lifitegrast Itching  Burning pain Shirley Friar    PAST MEDICAL HISTORY Past Medical History:  Diagnosis Date   Arthritis    Chronic sinusitis    Generalized headaches    GERD (gastroesophageal reflux disease)    Hypothyroidism    Past Surgical History:  Procedure Laterality Date   ANTERIOR CERVICAL DECOMP/DISCECTOMY FUSION N/A 08/28/2017   Procedure: ANTERIOR CERVICAL DECOMPRESSION AND FUSION CERVICAL FIVE-SIX,CERVICAL SIX-SEVEN;  Surgeon: Earnie Larsson, MD;  Location: [place];  Service: Neurosurgery;  Laterality: N/A;  anterior   BIOPSY  01/24/2020   Procedure: BIOPSY;  Surgeon: Daneil Dolin, MD;  [facility];  Service:  Endoscopy;;  gastric polyp; gastric mucosa   CARPAL TUNNEL RELEASE Right 2001   CATARACT EXTRACTION W/PHACO Right 08/08/2017   Procedure: CATARACT EXTRACTION PHACO AND INTRAOCULAR LENS PLACEMENT ([place]);  Surgeon: Rutherford Guys, MD;  [facility];  Service: Ophthalmology;  Laterality: Right;  CDE: 4.18   CATARACT EXTRACTION W/PHACO Left 11/10/2017   Procedure: CATARACT EXTRACTION PHACO AND INTRAOCULAR LENS PLACEMENT ([facility]);  Surgeon: Rutherford Guys, MD;  [facility];  Service: Ophthalmology;  Laterality: Left;  CDE: 3.27   CHOLECYSTECTOMY     COLONOSCOPY     COLONOSCOPY N/A 04/03/2020   Procedure: COLONOSCOPY;  Surgeon: Daneil Dolin, MD;  [facility];  Service: Endoscopy;  Laterality: N/A;  10:00am   CYST EXCISION     right eye   ESOPHAGOGASTRODUODENOSCOPY N/A 01/24/2020   Procedure: ESOPHAGOGASTRODUODENOSCOPY (EGD);  Surgeon: Daneil Dolin, MD;  [facility];  Service: Endoscopy;  Laterality: N/A;  2:00pm   EYE SURGERY     HAMMER TOE SURGERY Bilateral    IR RADIOLOGIST EVAL & MGMT  07/14/2021   NASAL/SINUS ENDOSCOPY     TUBAL LIGATION     tubal reversal  1991    FAMILY HISTORY Family History  Problem Relation Age of Onset   Other Mother    Other Father    Colon cancer Neg Hx     SOCIAL HISTORY Social History   Tobacco Use   Smoking status: Never   Smokeless tobacco: Never  Vaping Use   Vaping Use: Never used  Substance Use Topics   Alcohol use: No   Drug use: No         OPHTHALMIC EXAM:  Base Eye Exam     Visual Acuity (ETDRS)       Right Left   Dist cc 20/30 -2 20/30 -1+2   Dist ph cc NI NI    Correction: Glasses         Tonometry (Tonopen, 1:26 PM)       Right Left   Pressure 9 8         Pupils       Pupils Dark Light React APD   Right PERRL 6 5 Brisk None   Left PERRL 6 5 Brisk None         Visual Fields (Counting fingers)       Left Right    Full Full         Extraocular Movement       Right Left     Full Full         Neuro/Psych     Oriented x3: Yes   Mood/Affect: Normal         Dilation     Both eyes: 1.0% Mydriacyl, 2.5% Phenylephrine @ 1:26 PM           Slit Lamp and Fundus Exam  External Exam       Right Left   External Normal Normal         Slit Lamp Exam       Right Left   Lids/Lashes Normal Normal   Conjunctiva/Sclera White and quiet White and quiet   Cornea Clear Clear   Anterior Chamber Deep and quiet Deep and quiet   Iris Round and reactive Round and reactive   Lens Centered posterior chamber intraocular lens Centered posterior chamber intraocular lens   Anterior Vitreous Normal Normal         Fundus Exam       Right Left   Posterior Vitreous Normal Normal   Disc Normal Normal   C/D Ratio 0.15 0.15   Macula Normal Macular reflex of ERM but no topo distortion   Vessels Old CRVO, OD, COMPENSATED,,,    Normal   Periphery Normal Normal            IMAGING AND PROCEDURES  Imaging and Procedures for 05/05/22  OCT, Retina - OU - Both Eyes       Right Eye Quality was good. Scan locations included subfoveal. Central Foveal Thickness: 235. Progression has been stable. Findings include abnormal foveal contour, outer retinal atrophy.   Left Eye Quality was good. Scan locations included subfoveal. Central Foveal Thickness: 264. Progression has been stable. Findings include abnormal foveal contour, epiretinal membrane.   Notes OS with no foveal distortion  OD with outer retinal disturbance and a parafoveal location overall margins this is stable with no signs of active disease.      Color Fundus Photography Optos - OU - Both Eyes       Right Eye Progression has been stable. Disc findings include pallor. Macula : normal observations. Periphery : normal observations.   Left Eye Progression has improved. Disc findings include normal observations. Periphery : normal observations.   Notes Retinal vasculature now with normal  caliber veins OD slight pallor temporally on the nerve OS with mild cellophane maculopathy appearance temporal to the fovea with no topographic distortion.  Posterior vitreous detachment is visualized.              ASSESSMENT/PLAN:  Posterior vitreous detachment of left eye No holes or tears  Dry eyes, bilateral Condition improved no longer uses artificial tears  Central retinal vein occlusion of right eye History of CRVO with AI ON features but nonetheless responsive to therapy in the past with antivegF and stable acuity  Macular pucker, left eye Minor, no topo distortion observe     ICD-10-CM   1. Stable central retinal vein occlusion of right eye  H34.8112 OCT, Retina - OU - Both Eyes    Color Fundus Photography Optos - OU - Both Eyes    2. Posterior vitreous detachment of left eye  H43.812     3. Dry eyes, bilateral  H04.123     4. Macular pucker, left eye  H35.372       1.  OU stable,  2.  No specific therapy for the retina conditions required at this time with no impact on acuity  3.  Follow-up Dr. Susa Simmonds as scheduled  Ophthalmic Meds Ordered this visit:  No orders of the defined types were placed in this encounter.      Return in about 1 year (around 05/06/2023) for DILATE OU, COLOR FP, OCT.  There are no Patient Instructions on file for this visit.   Explained the diagnoses, plan, and follow up with the patient  and they expressed understanding.  Patient expressed understanding of the importance of proper follow up care.   Clent Demark Rankin M.D. Diseases & Surgery of the Retina and Vitreous [facility] [place] 05/05/22     Abbreviations: M myopia (nearsighted); A astigmatism; H hyperopia (farsighted); P presbyopia; Mrx spectacle prescription;  CTL contact lenses; OD right eye; OS left eye; OU both eyes  XT exotropia; ET esotropia; PEK punctate epithelial keratitis; PEE punctate epithelial erosions; DES dry eye syndrome; MGD meibomian  gland dysfunction; ATs artificial tears; PFAT's preservative free artificial tears; Teller nuclear sclerotic cataract; PSC posterior subcapsular cataract; ERM epi-retinal membrane; PVD posterior vitreous detachment; RD retinal detachment; DM diabetes mellitus; DR diabetic retinopathy; NPDR non-proliferative diabetic retinopathy; PDR proliferative diabetic retinopathy; CSME clinically significant macular edema; DME diabetic macular edema; dbh dot blot hemorrhages; CWS cotton wool spot; POAG primary open angle glaucoma; C/D cup-to-disc ratio; HVF humphrey visual field; GVF goldmann visual field; OCT optical coherence tomography; IOP intraocular pressure; BRVO Branch retinal vein occlusion; CRVO central retinal vein occlusion; CRAO central retinal artery occlusion; BRAO branch retinal artery occlusion; RT retinal tear; SB scleral buckle; PPV pars plana vitrectomy; VH Vitreous hemorrhage; PRP panretinal laser photocoagulation; IVK intravitreal kenalog; VMT vitreomacular traction; MH Macular hole;  NVD neovascularization of the disc; NVE neovascularization elsewhere; AREDS age related eye disease study; ARMD age related macular degeneration; POAG primary open angle glaucoma; EBMD epithelial/anterior basement membrane dystrophy; ACIOL anterior chamber intraocular lens; IOL intraocular lens; PCIOL posterior chamber intraocular lens; Phaco/IOL phacoemulsification with intraocular lens placement; [place] photorefractive keratectomy; LASIK laser assisted in situ keratomileusis; HTN hypertension; DM diabetes mellitus; COPD chronic obstructive pulmonary disease

## 2022-05-05 NOTE — Assessment & Plan Note (Signed)
Condition improved no longer uses artificial tears

## 2022-05-05 NOTE — Assessment & Plan Note (Signed)
Minor, no topo distortion observe

## 2022-06-01 DIAGNOSIS — H04123 Dry eye syndrome of bilateral lacrimal glands: Secondary | ICD-10-CM | POA: Diagnosis not present

## 2022-06-01 DIAGNOSIS — H18419 Arcus senilis, unspecified eye: Secondary | ICD-10-CM | POA: Diagnosis not present

## 2022-06-01 DIAGNOSIS — Z961 Presence of intraocular lens: Secondary | ICD-10-CM | POA: Diagnosis not present

## 2022-06-03 DIAGNOSIS — E538 Deficiency of other specified B group vitamins: Secondary | ICD-10-CM | POA: Diagnosis not present

## 2022-06-29 DIAGNOSIS — N951 Menopausal and female climacteric states: Secondary | ICD-10-CM | POA: Diagnosis not present

## 2022-06-29 DIAGNOSIS — R232 Flushing: Secondary | ICD-10-CM | POA: Diagnosis not present

## 2022-07-01 DIAGNOSIS — E538 Deficiency of other specified B group vitamins: Secondary | ICD-10-CM | POA: Diagnosis not present

## 2022-07-20 DIAGNOSIS — E039 Hypothyroidism, unspecified: Secondary | ICD-10-CM | POA: Diagnosis not present

## 2022-07-20 DIAGNOSIS — K2100 Gastro-esophageal reflux disease with esophagitis, without bleeding: Secondary | ICD-10-CM | POA: Diagnosis not present

## 2022-07-20 DIAGNOSIS — N183 Chronic kidney disease, stage 3 unspecified: Secondary | ICD-10-CM | POA: Diagnosis not present

## 2022-07-20 DIAGNOSIS — E7849 Other hyperlipidemia: Secondary | ICD-10-CM | POA: Diagnosis not present

## 2022-07-20 DIAGNOSIS — R011 Cardiac murmur, unspecified: Secondary | ICD-10-CM | POA: Diagnosis not present

## 2022-07-25 DIAGNOSIS — M797 Fibromyalgia: Secondary | ICD-10-CM | POA: Diagnosis not present

## 2022-07-25 DIAGNOSIS — Z23 Encounter for immunization: Secondary | ICD-10-CM | POA: Diagnosis not present

## 2022-07-25 DIAGNOSIS — R011 Cardiac murmur, unspecified: Secondary | ICD-10-CM | POA: Diagnosis not present

## 2022-07-25 DIAGNOSIS — M5030 Other cervical disc degeneration, unspecified cervical region: Secondary | ICD-10-CM | POA: Diagnosis not present

## 2022-07-25 DIAGNOSIS — M1990 Unspecified osteoarthritis, unspecified site: Secondary | ICD-10-CM | POA: Diagnosis not present

## 2022-07-25 DIAGNOSIS — M79671 Pain in right foot: Secondary | ICD-10-CM | POA: Diagnosis not present

## 2022-07-25 DIAGNOSIS — M79674 Pain in right toe(s): Secondary | ICD-10-CM | POA: Diagnosis not present

## 2022-07-25 DIAGNOSIS — E039 Hypothyroidism, unspecified: Secondary | ICD-10-CM | POA: Diagnosis not present

## 2022-07-25 DIAGNOSIS — M3130 Wegener's granulomatosis without renal involvement: Secondary | ICD-10-CM | POA: Diagnosis not present

## 2022-07-25 DIAGNOSIS — M79675 Pain in left toe(s): Secondary | ICD-10-CM | POA: Diagnosis not present

## 2022-07-25 DIAGNOSIS — Z6826 Body mass index (BMI) 26.0-26.9, adult: Secondary | ICD-10-CM | POA: Diagnosis not present

## 2022-07-25 DIAGNOSIS — M25579 Pain in unspecified ankle and joints of unspecified foot: Secondary | ICD-10-CM | POA: Diagnosis not present

## 2022-07-25 DIAGNOSIS — G43909 Migraine, unspecified, not intractable, without status migrainosus: Secondary | ICD-10-CM | POA: Diagnosis not present

## 2022-07-25 DIAGNOSIS — E7849 Other hyperlipidemia: Secondary | ICD-10-CM | POA: Diagnosis not present

## 2022-07-25 DIAGNOSIS — M79672 Pain in left foot: Secondary | ICD-10-CM | POA: Diagnosis not present

## 2022-07-25 DIAGNOSIS — R059 Cough, unspecified: Secondary | ICD-10-CM | POA: Diagnosis not present

## 2022-07-25 DIAGNOSIS — M180 Bilateral primary osteoarthritis of first carpometacarpal joints: Secondary | ICD-10-CM | POA: Diagnosis not present

## 2022-07-25 DIAGNOSIS — K2100 Gastro-esophageal reflux disease with esophagitis, without bleeding: Secondary | ICD-10-CM | POA: Diagnosis not present

## 2022-07-26 ENCOUNTER — Telehealth: Payer: Self-pay

## 2022-07-26 ENCOUNTER — Encounter: Payer: Self-pay | Admitting: Gastroenterology

## 2022-07-26 ENCOUNTER — Telehealth (INDEPENDENT_AMBULATORY_CARE_PROVIDER_SITE_OTHER): Payer: Medicare Other | Admitting: Gastroenterology

## 2022-07-26 VITALS — Ht 63.5 in | Wt 155.0 lb

## 2022-07-26 DIAGNOSIS — K5900 Constipation, unspecified: Secondary | ICD-10-CM

## 2022-07-26 DIAGNOSIS — K2100 Gastro-esophageal reflux disease with esophagitis, without bleeding: Secondary | ICD-10-CM | POA: Diagnosis not present

## 2022-07-26 NOTE — Telephone Encounter (Signed)
Brittany Shields, you are scheduled for a virtual visit with your provider today.  Just as we do with appointments in the office, we must obtain your consent to participate.  Your consent will be active for this visit and any virtual visit you may have with one of our providers in the next 365 days.  If you have a MyChart account, I can also send a copy of this consent to you electronically.  All virtual visits are billed to your insurance company just like a traditional visit in the office.  As this is a virtual visit, video technology does not allow for your provider to perform a traditional examination.  This may limit your provider's ability to fully assess your condition.  If your provider identifies any concerns that need to be evaluated in person or the need to arrange testing such as labs, EKG, etc, we will make arrangements to do so.  Although advances in technology are sophisticated, we cannot ensure that it will always work on either your end or our end.  If the connection with a video visit is poor, we may have to switch to a telephone visit.  With either a video or telephone visit, we are not always able to ensure that we have a secure connection.   I need to obtain your verbal consent now.   Are you willing to proceed with your visit today? Yes.

## 2022-07-26 NOTE — Patient Instructions (Signed)
Let's not take Linzess or Colace today and tomorrow. Restart on 9.14.  Please call me with how you are doing on the lower dosage of Linzess! We may need to change this!  I will see you in 6 months!  I enjoyed seeing you again today! As you know, I value our relationship and want to provide genuine, compassionate, and quality care. I welcome your feedback. If you receive a survey regarding your visit,  I greatly appreciate you taking time to fill this out. See you next time!  Annitta Needs, PhD, ANP-BC [facility]

## 2022-07-26 NOTE — Progress Notes (Signed)
Primary Care Physician:  Caryl Bis, MD  Primary GI: Dr. Gala Romney   Patient Location: Home   Provider Location: [facility] office   Reason for Visit: Follow-up   Persons present on the virtual encounter, with roles: Patient and NP   Total time (minutes) spent on medical discussion: 10 minutes   Due to COVID-19, visit was conducted using virtual method.  Visit was requested by patient.  Virtual Visit via MyChart Video Note Due to COVID-19, visit is conducted virtually and was requested by patient.   I connected with Brittany Shields on 07/26/22 at  1:30 PM EDT by video and verified that I am speaking with the correct person using two identifiers.   I discussed the limitations, risks, security and privacy concerns of performing an evaluation and management service by video and the availability of in person appointments. I also discussed with the patient that there may be a patient responsible charge related to this service. The patient expressed understanding and agreed to proceed.  Chief Complaint  Patient presents with   Follow-up    Going well.      History of Present Illness: Brittany Shields is a 71 y.o. female presenting today in follow-up with a history of pancolonic diverticulosis, GERD, constipation, Grade 2 hemorrhoids, s/p banding of left lateral hemorrhoid 07/10/20. She presented to the ED at [facility] 07/26/20 with acute onset large volume rectal bleeding with clots. In review of her labs, it does show a decrease in Hgb from her baseline. She denied any aspirin, Ibuprofen, on no anticoagulants or anti-platelets. She is on low-dose prednisone for many years. I suspect she had a delayed bleed after sloughing of the scab that would occur around the 10-14 day mark. Unable to rule out diverticular origin. Here for routine visit.   Meds changed around and feels queasy today. Had flu shot today.   Linzess 145 mcg daily causing too frequent bowel movements for several months. Very  frequent bowel movements on this recently. Her PCP recommended Linzess 72 mcg. She hasn't started this yet. She will be picking up Linzess 72 mcg today. No Linzess today. She was instructed to take every other day by PCP. Still taking stool softener. Fiber in the past caused lots of gas. Stopped taking fiber as this caused lots of gas.   On Nexium BID now instead of omeprazole.  Past Medical History:  Diagnosis Date   Arthritis    Chronic sinusitis    Generalized headaches    GERD (gastroesophageal reflux disease)    Hypothyroidism      Past Surgical History:  Procedure Laterality Date   ANTERIOR CERVICAL DECOMP/DISCECTOMY FUSION N/A 08/28/2017   Procedure: ANTERIOR CERVICAL DECOMPRESSION AND FUSION CERVICAL FIVE-SIX,CERVICAL SIX-SEVEN;  Surgeon: Earnie Larsson, MD;  Location: [place];  Service: Neurosurgery;  Laterality: N/A;  anterior   BIOPSY  01/24/2020   Procedure: BIOPSY;  Surgeon: Daneil Dolin, MD;  [facility];  Service: Endoscopy;;  gastric polyp; gastric mucosa   CARPAL TUNNEL RELEASE Right 2001   CATARACT EXTRACTION W/PHACO Right 08/08/2017   Procedure: CATARACT EXTRACTION PHACO AND INTRAOCULAR LENS PLACEMENT ([place]);  Surgeon: Rutherford Guys, MD;  [facility];  Service: Ophthalmology;  Laterality: Right;  CDE: 4.18   CATARACT EXTRACTION W/PHACO Left 11/10/2017   Procedure: CATARACT EXTRACTION PHACO AND INTRAOCULAR LENS PLACEMENT ([facility]);  Surgeon: Rutherford Guys, MD;  [facility]: [facility];  Service: Ophthalmology;  Laterality: Left;  CDE: 3.27   CHOLECYSTECTOMY     COLONOSCOPY  COLONOSCOPY N/A 04/03/2020   Procedure: COLONOSCOPY;  Surgeon: Daneil Dolin, MD;  [facility];  Service: Endoscopy;  Laterality: N/A;  10:00am   CYST EXCISION     right eye   ESOPHAGOGASTRODUODENOSCOPY N/A 01/24/2020   Procedure: ESOPHAGOGASTRODUODENOSCOPY (EGD);  Surgeon: Daneil Dolin, MD;  [facility];  Service: Endoscopy;  Laterality: N/A;  2:00pm   EYE SURGERY      HAMMER TOE SURGERY Bilateral    IR RADIOLOGIST EVAL & MGMT  07/14/2021   NASAL/SINUS ENDOSCOPY     TUBAL LIGATION     tubal reversal  1991     Current Meds  Medication Sig   acetaminophen (TYLENOL) 500 MG tablet Take 1,000 mg by mouth as needed for moderate pain or headache.   Calcium Carb-Cholecalciferol (CALCIUM 600+D3 PO) Take 2 tablets by mouth every morning.    Carboxymethylcellulose Sodium (REFRESH TEARS OP) Place 1 application into both eyes as needed (dry eyes).    CATAPRES-TTS-1 0.1 MG/24HR patch Place onto the skin.   Cyanocobalamin (B-12 COMPLIANCE INJECTION) 1000 MCG/ML KIT Inject 1,000 mcg as directed every 30 (thirty) days.   docusate sodium (COLACE) 100 MG capsule Take 200 mg by mouth at bedtime.   esomeprazole (NEXIUM) 40 MG capsule Take 40 mg by mouth 2 (two) times daily.   ferrous sulfate 324 MG TBEC Take 324 mg by mouth.   fluticasone (FLONASE) 50 MCG/ACT nasal spray Place 1 spray into both nostrils 2 (two) times daily.   hydrocortisone (ANUSOL-HC) 2.5 % rectal cream Place 1 application. rectally 2 (two) times daily as needed for hemorrhoids or anal itching.   Hypertonic Nasal Wash (SINUS RINSE NA) Place 1 each into the nose 2 (two) times daily.   levothyroxine (SYNTHROID, LEVOTHROID) 50 MCG tablet Take 50 mcg by mouth daily before breakfast.   linaclotide (LINZESS) 145 MCG CAPS capsule Take 1 capsule (145 mcg total) by mouth daily before breakfast. (Patient taking differently: Take 72 mcg by mouth daily before breakfast.)   miconazole (MONISTAT 7) 2 % vaginal cream Place 1 Applicatorful vaginally 2 (two) times daily.   PARoxetine (PAXIL) 20 MG tablet Take 20 mg by mouth daily.   predniSONE (DELTASONE) 2.5 MG tablet Take 2.5 mg by mouth daily with breakfast.   pregabalin (LYRICA) 100 MG capsule Take 100 mg by mouth daily.   Probiotic Product (PROBIOTIC DAILY) CAPS Take 1 capsule by mouth daily.   triamcinolone cream (KENALOG) 0.1 %      Family History  Problem  Relation Age of Onset   Other Mother    Other Father    Colon cancer Neg Hx     Social History   Socioeconomic History   Marital status: Married    Spouse name: Not on file   Number of children: Not on file   Years of education: Not on file   Highest education level: Not on file  Occupational History   Not on file  Tobacco Use   Smoking status: Never   Smokeless tobacco: Never  Vaping Use   Vaping Use: Never used  Substance and Sexual Activity   Alcohol use: No   Drug use: No   Sexual activity: Never  Other Topics Concern   Not on file  Social History Narrative   Not on file   Social Determinants of Health   Financial Resource Strain: Not on file  Food Insecurity: Not on file  Transportation Needs: Not on file  Physical Activity: Not on file  Stress:  Not on file  Social Connections: Not on file       Review of Systems: Gen: Denies fever, chills, anorexia. Denies fatigue, weakness, weight loss.  CV: Denies chest pain, palpitations, syncope, peripheral edema, and claudication. Resp: Denies dyspnea at rest, cough, wheezing, coughing up blood, and pleurisy. GI: see HPI Derm: Denies rash, itching, dry skin Psych: Denies depression, anxiety, memory loss, confusion. No homicidal or suicidal ideation.  Heme: Denies bruising, bleeding, and enlarged lymph nodes.  Observations/Objective: No distress. Unable to perform physical exam due to video encounter.   Assessment and Plan: 71 y.o. female presenting today in follow-up with a history of pancolonic diverticulosis, GERD, constipation, Grade 2 hemorrhoids, s/p banding of left lateral hemorrhoid 07/10/20. She presented to the ED at [facility] 07/26/20 with acute onset large volume rectal bleeding with clots. In review of her labs, it does show a decrease in Hgb from her baseline. She denied any aspirin, Ibuprofen, on no anticoagulants or anti-platelets. She is on low-dose prednisone for many years. I suspect she had a delayed  bleed after sloughing of the scab that would occur around the 10-14 day mark. Unable to rule out diverticular origin. Here for routine visit.  Constipation: now with more frequent stools on Linzess 145 mcg daily for several months. I agree with decreasing to Linzess 72 mcg daily. I have asked her to stop stool softener and Linzess today and tomorrow. Resume on Thursday with just Linzess 72 mcg daily. We may need to add back in stool softener.   GERD: continue Nexium BID.    Follow Up Instructions: See AVS   I discussed the assessment and treatment plan with the patient. The patient was provided an opportunity to ask questions and all were answered. The patient agreed with the plan and demonstrated an understanding of the instructions.   The patient was advised to call back or seek an in-person evaluation if the symptoms worsen or if the condition fails to improve as anticipated.  I provided 10 minutes of face-to-face time during this MyChart Video encounter.  Annitta Needs, PhD, ANP-BC [facility] Gastroenterology

## 2022-08-02 DIAGNOSIS — E538 Deficiency of other specified B group vitamins: Secondary | ICD-10-CM | POA: Diagnosis not present

## 2022-08-02 DIAGNOSIS — R030 Elevated blood-pressure reading, without diagnosis of hypertension: Secondary | ICD-10-CM | POA: Diagnosis not present

## 2022-08-04 DIAGNOSIS — G4711 Idiopathic hypersomnia with long sleep time: Secondary | ICD-10-CM | POA: Diagnosis not present

## 2022-08-16 DIAGNOSIS — D72829 Elevated white blood cell count, unspecified: Secondary | ICD-10-CM | POA: Diagnosis not present

## 2022-08-16 DIAGNOSIS — K2100 Gastro-esophageal reflux disease with esophagitis, without bleeding: Secondary | ICD-10-CM | POA: Diagnosis not present

## 2022-08-16 DIAGNOSIS — N183 Chronic kidney disease, stage 3 unspecified: Secondary | ICD-10-CM | POA: Diagnosis not present

## 2022-08-16 DIAGNOSIS — E782 Mixed hyperlipidemia: Secondary | ICD-10-CM | POA: Diagnosis not present

## 2022-08-16 DIAGNOSIS — E039 Hypothyroidism, unspecified: Secondary | ICD-10-CM | POA: Diagnosis not present

## 2022-08-16 DIAGNOSIS — M797 Fibromyalgia: Secondary | ICD-10-CM | POA: Diagnosis not present

## 2022-08-22 DIAGNOSIS — M79671 Pain in right foot: Secondary | ICD-10-CM | POA: Diagnosis not present

## 2022-08-22 DIAGNOSIS — M1990 Unspecified osteoarthritis, unspecified site: Secondary | ICD-10-CM | POA: Diagnosis not present

## 2022-08-22 DIAGNOSIS — M25579 Pain in unspecified ankle and joints of unspecified foot: Secondary | ICD-10-CM | POA: Diagnosis not present

## 2022-08-22 DIAGNOSIS — M79672 Pain in left foot: Secondary | ICD-10-CM | POA: Diagnosis not present

## 2022-08-23 DIAGNOSIS — E039 Hypothyroidism, unspecified: Secondary | ICD-10-CM | POA: Diagnosis not present

## 2022-08-23 DIAGNOSIS — M180 Bilateral primary osteoarthritis of first carpometacarpal joints: Secondary | ICD-10-CM | POA: Diagnosis not present

## 2022-08-23 DIAGNOSIS — E7849 Other hyperlipidemia: Secondary | ICD-10-CM | POA: Diagnosis not present

## 2022-08-23 DIAGNOSIS — J301 Allergic rhinitis due to pollen: Secondary | ICD-10-CM | POA: Diagnosis not present

## 2022-08-23 DIAGNOSIS — K2100 Gastro-esophageal reflux disease with esophagitis, without bleeding: Secondary | ICD-10-CM | POA: Diagnosis not present

## 2022-08-23 DIAGNOSIS — M3130 Wegener's granulomatosis without renal involvement: Secondary | ICD-10-CM | POA: Diagnosis not present

## 2022-08-23 DIAGNOSIS — G43909 Migraine, unspecified, not intractable, without status migrainosus: Secondary | ICD-10-CM | POA: Diagnosis not present

## 2022-08-23 DIAGNOSIS — R011 Cardiac murmur, unspecified: Secondary | ICD-10-CM | POA: Diagnosis not present

## 2022-08-23 DIAGNOSIS — R059 Cough, unspecified: Secondary | ICD-10-CM | POA: Diagnosis not present

## 2022-08-23 DIAGNOSIS — Z0001 Encounter for general adult medical examination with abnormal findings: Secondary | ICD-10-CM | POA: Diagnosis not present

## 2022-08-23 DIAGNOSIS — M797 Fibromyalgia: Secondary | ICD-10-CM | POA: Diagnosis not present

## 2022-08-23 DIAGNOSIS — M5030 Other cervical disc degeneration, unspecified cervical region: Secondary | ICD-10-CM | POA: Diagnosis not present

## 2022-09-05 DIAGNOSIS — E538 Deficiency of other specified B group vitamins: Secondary | ICD-10-CM | POA: Diagnosis not present

## 2022-09-05 DIAGNOSIS — R030 Elevated blood-pressure reading, without diagnosis of hypertension: Secondary | ICD-10-CM | POA: Diagnosis not present

## 2022-09-06 DIAGNOSIS — Z23 Encounter for immunization: Secondary | ICD-10-CM | POA: Diagnosis not present

## 2022-09-12 DIAGNOSIS — N951 Menopausal and female climacteric states: Secondary | ICD-10-CM | POA: Diagnosis not present

## 2022-09-19 DIAGNOSIS — J0100 Acute maxillary sinusitis, unspecified: Secondary | ICD-10-CM | POA: Diagnosis not present

## 2022-09-19 DIAGNOSIS — J209 Acute bronchitis, unspecified: Secondary | ICD-10-CM | POA: Diagnosis not present

## 2022-09-19 DIAGNOSIS — Z6826 Body mass index (BMI) 26.0-26.9, adult: Secondary | ICD-10-CM | POA: Diagnosis not present

## 2022-09-19 DIAGNOSIS — Z20828 Contact with and (suspected) exposure to other viral communicable diseases: Secondary | ICD-10-CM | POA: Diagnosis not present

## 2022-09-19 DIAGNOSIS — R030 Elevated blood-pressure reading, without diagnosis of hypertension: Secondary | ICD-10-CM | POA: Diagnosis not present

## 2022-09-28 DIAGNOSIS — J209 Acute bronchitis, unspecified: Secondary | ICD-10-CM | POA: Diagnosis not present

## 2022-09-28 DIAGNOSIS — J0101 Acute recurrent maxillary sinusitis: Secondary | ICD-10-CM | POA: Diagnosis not present

## 2022-09-28 DIAGNOSIS — R030 Elevated blood-pressure reading, without diagnosis of hypertension: Secondary | ICD-10-CM | POA: Diagnosis not present

## 2022-10-07 DIAGNOSIS — J9801 Acute bronchospasm: Secondary | ICD-10-CM | POA: Diagnosis not present

## 2022-10-07 DIAGNOSIS — R059 Cough, unspecified: Secondary | ICD-10-CM | POA: Diagnosis not present

## 2022-10-07 DIAGNOSIS — Z6825 Body mass index (BMI) 25.0-25.9, adult: Secondary | ICD-10-CM | POA: Diagnosis not present

## 2022-10-07 DIAGNOSIS — R030 Elevated blood-pressure reading, without diagnosis of hypertension: Secondary | ICD-10-CM | POA: Diagnosis not present

## 2022-10-07 DIAGNOSIS — E538 Deficiency of other specified B group vitamins: Secondary | ICD-10-CM | POA: Diagnosis not present

## 2022-10-18 DIAGNOSIS — Z79899 Other long term (current) drug therapy: Secondary | ICD-10-CM | POA: Diagnosis not present

## 2022-10-18 DIAGNOSIS — J310 Chronic rhinitis: Secondary | ICD-10-CM | POA: Diagnosis not present

## 2022-10-18 DIAGNOSIS — J328 Other chronic sinusitis: Secondary | ICD-10-CM | POA: Diagnosis not present

## 2022-10-18 DIAGNOSIS — K219 Gastro-esophageal reflux disease without esophagitis: Secondary | ICD-10-CM | POA: Diagnosis not present

## 2022-10-18 DIAGNOSIS — J3489 Other specified disorders of nose and nasal sinuses: Secondary | ICD-10-CM | POA: Diagnosis not present

## 2022-10-31 DIAGNOSIS — M778 Other enthesopathies, not elsewhere classified: Secondary | ICD-10-CM | POA: Diagnosis not present

## 2022-10-31 DIAGNOSIS — M79674 Pain in right toe(s): Secondary | ICD-10-CM | POA: Diagnosis not present

## 2022-10-31 DIAGNOSIS — L110 Acquired keratosis follicularis: Secondary | ICD-10-CM | POA: Diagnosis not present

## 2022-10-31 DIAGNOSIS — M79671 Pain in right foot: Secondary | ICD-10-CM | POA: Diagnosis not present

## 2022-10-31 DIAGNOSIS — M79672 Pain in left foot: Secondary | ICD-10-CM | POA: Diagnosis not present

## 2022-10-31 DIAGNOSIS — M79675 Pain in left toe(s): Secondary | ICD-10-CM | POA: Diagnosis not present

## 2022-11-03 DIAGNOSIS — J4540 Moderate persistent asthma, uncomplicated: Secondary | ICD-10-CM | POA: Diagnosis not present

## 2022-11-03 DIAGNOSIS — Z6825 Body mass index (BMI) 25.0-25.9, adult: Secondary | ICD-10-CM | POA: Diagnosis not present

## 2022-11-03 DIAGNOSIS — R030 Elevated blood-pressure reading, without diagnosis of hypertension: Secondary | ICD-10-CM | POA: Diagnosis not present

## 2022-11-10 DIAGNOSIS — E538 Deficiency of other specified B group vitamins: Secondary | ICD-10-CM | POA: Diagnosis not present

## 2022-11-10 DIAGNOSIS — R030 Elevated blood-pressure reading, without diagnosis of hypertension: Secondary | ICD-10-CM | POA: Diagnosis not present

## 2022-12-12 DIAGNOSIS — R030 Elevated blood-pressure reading, without diagnosis of hypertension: Secondary | ICD-10-CM | POA: Diagnosis not present

## 2022-12-12 DIAGNOSIS — E538 Deficiency of other specified B group vitamins: Secondary | ICD-10-CM | POA: Diagnosis not present

## 2022-12-20 ENCOUNTER — Encounter: Payer: Self-pay | Admitting: Gastroenterology

## 2022-12-21 DIAGNOSIS — E039 Hypothyroidism, unspecified: Secondary | ICD-10-CM | POA: Diagnosis not present

## 2022-12-21 DIAGNOSIS — K2100 Gastro-esophageal reflux disease with esophagitis, without bleeding: Secondary | ICD-10-CM | POA: Diagnosis not present

## 2022-12-21 DIAGNOSIS — N183 Chronic kidney disease, stage 3 unspecified: Secondary | ICD-10-CM | POA: Diagnosis not present

## 2022-12-21 DIAGNOSIS — E7849 Other hyperlipidemia: Secondary | ICD-10-CM | POA: Diagnosis not present

## 2022-12-28 DIAGNOSIS — N951 Menopausal and female climacteric states: Secondary | ICD-10-CM | POA: Diagnosis not present

## 2022-12-28 DIAGNOSIS — Z79899 Other long term (current) drug therapy: Secondary | ICD-10-CM | POA: Diagnosis not present

## 2022-12-29 DIAGNOSIS — R030 Elevated blood-pressure reading, without diagnosis of hypertension: Secondary | ICD-10-CM | POA: Diagnosis not present

## 2022-12-29 DIAGNOSIS — M180 Bilateral primary osteoarthritis of first carpometacarpal joints: Secondary | ICD-10-CM | POA: Diagnosis not present

## 2022-12-29 DIAGNOSIS — M3130 Wegener's granulomatosis without renal involvement: Secondary | ICD-10-CM | POA: Diagnosis not present

## 2022-12-29 DIAGNOSIS — Z6825 Body mass index (BMI) 25.0-25.9, adult: Secondary | ICD-10-CM | POA: Diagnosis not present

## 2022-12-29 DIAGNOSIS — G43909 Migraine, unspecified, not intractable, without status migrainosus: Secondary | ICD-10-CM | POA: Diagnosis not present

## 2022-12-29 DIAGNOSIS — M5030 Other cervical disc degeneration, unspecified cervical region: Secondary | ICD-10-CM | POA: Diagnosis not present

## 2022-12-29 DIAGNOSIS — Z23 Encounter for immunization: Secondary | ICD-10-CM | POA: Diagnosis not present

## 2022-12-29 DIAGNOSIS — K2100 Gastro-esophageal reflux disease with esophagitis, without bleeding: Secondary | ICD-10-CM | POA: Diagnosis not present

## 2022-12-29 DIAGNOSIS — M797 Fibromyalgia: Secondary | ICD-10-CM | POA: Diagnosis not present

## 2022-12-29 DIAGNOSIS — E7849 Other hyperlipidemia: Secondary | ICD-10-CM | POA: Diagnosis not present

## 2022-12-29 DIAGNOSIS — E039 Hypothyroidism, unspecified: Secondary | ICD-10-CM | POA: Diagnosis not present

## 2022-12-29 DIAGNOSIS — R011 Cardiac murmur, unspecified: Secondary | ICD-10-CM | POA: Diagnosis not present

## 2023-01-09 DIAGNOSIS — R030 Elevated blood-pressure reading, without diagnosis of hypertension: Secondary | ICD-10-CM | POA: Diagnosis not present

## 2023-01-09 DIAGNOSIS — E538 Deficiency of other specified B group vitamins: Secondary | ICD-10-CM | POA: Diagnosis not present

## 2023-01-10 DIAGNOSIS — M797 Fibromyalgia: Secondary | ICD-10-CM | POA: Diagnosis not present

## 2023-01-10 DIAGNOSIS — Z6825 Body mass index (BMI) 25.0-25.9, adult: Secondary | ICD-10-CM | POA: Diagnosis not present

## 2023-01-10 DIAGNOSIS — R030 Elevated blood-pressure reading, without diagnosis of hypertension: Secondary | ICD-10-CM | POA: Diagnosis not present

## 2023-01-17 ENCOUNTER — Ambulatory Visit (INDEPENDENT_AMBULATORY_CARE_PROVIDER_SITE_OTHER): Payer: Medicare Other | Admitting: Gastroenterology

## 2023-01-17 ENCOUNTER — Encounter: Payer: Self-pay | Admitting: Gastroenterology

## 2023-01-17 VITALS — BP 121/80 | HR 99 | Temp 97.9°F | Ht 63.0 in | Wt 146.8 lb

## 2023-01-17 DIAGNOSIS — K219 Gastro-esophageal reflux disease without esophagitis: Secondary | ICD-10-CM | POA: Diagnosis not present

## 2023-01-17 DIAGNOSIS — K5900 Constipation, unspecified: Secondary | ICD-10-CM

## 2023-01-17 NOTE — Patient Instructions (Signed)
Let's try Amitiza at twice a day for the next 1-2 weeks. We will see if you system can get used to it.  If not, I have given you samples of IBSrela. If Amitiza is not helpful, we will start this new sample. You will take it 5-10 minutes before breakfast and dinner. Let me know how it works for you if you end up taking it!  We will see you in 6 months!  Happy belated birthday!  I enjoyed seeing you again today! At our first visit, I mentioned how I value our relationship and want to provide genuine, compassionate, and quality care. You may receive a survey regarding your visit with me, and I welcome your feedback! Thanks so much for taking the time to complete this. I look forward to seeing you again.   Annitta Needs, PhD, ANP-BC [facility] Gastroenterology

## 2023-01-17 NOTE — Progress Notes (Signed)
Gastroenterology Office Note     Primary Care Physician:  Caryl Bis, MD  Primary Gastroenterologist: Dr. Gala Romney    Chief Complaint   Chief Complaint  Patient presents with   Follow-up    Follow up on GERD     History of Present Illness   Brittany Shields is a 72 y.o. female presenting today in follow-up with a history of  pancolonic diverticulosis, GERD, constipation, Grade 2 hemorrhoids s/p banding in 2021 with likely post-banding bleed, returning in follow-up. Last seen in Sept 2023.    Constipation: linzess not covered. Just started low volume bleeding again today. Abdomen will cramp prior to BM at times. Sometimes goes several times a day, sometimes not at all. Taking Amitiza just once daily.   GERD: Nexium BID. She has been unable to decrease to once daily due to breakthrough GERD.       Past Medical History:  Diagnosis Date   Arthritis    Chronic sinusitis    Generalized headaches    GERD (gastroesophageal reflux disease)    Hypothyroidism     Past Surgical History:  Procedure Laterality Date   ANTERIOR CERVICAL DECOMP/DISCECTOMY FUSION N/A 08/28/2017   Procedure: ANTERIOR CERVICAL DECOMPRESSION AND FUSION CERVICAL FIVE-SIX,CERVICAL SIX-SEVEN;  Surgeon: Earnie Larsson, MD;  Location: [place];  Service: Neurosurgery;  Laterality: N/A;  anterior   BIOPSY  01/24/2020   Procedure: BIOPSY;  Surgeon: Daneil Dolin, MD;  [facility];  Service: Endoscopy;;  gastric polyp; gastric mucosa   CARPAL TUNNEL RELEASE Right 2001   CATARACT EXTRACTION W/PHACO Right 08/08/2017   Procedure: CATARACT EXTRACTION PHACO AND INTRAOCULAR LENS PLACEMENT ([place]);  Surgeon: Rutherford Guys, MD;  [facility];  Service: Ophthalmology;  Laterality: Right;  CDE: 4.18   CATARACT EXTRACTION W/PHACO Left 11/10/2017   Procedure: CATARACT EXTRACTION PHACO AND INTRAOCULAR LENS PLACEMENT ([facility]);  Surgeon: Rutherford Guys, MD;  [facility];  Service: Ophthalmology;  Laterality:  Left;  CDE: 3.27   CHOLECYSTECTOMY     COLONOSCOPY     COLONOSCOPY N/A 04/03/2020   Procedure: COLONOSCOPY;  Surgeon: Daneil Dolin, MD;  [facility];  Service: Endoscopy;  Laterality: N/A;  10:00am   CYST EXCISION     right eye   ESOPHAGOGASTRODUODENOSCOPY N/A 01/24/2020   Procedure: ESOPHAGOGASTRODUODENOSCOPY ([facility]);  Surgeon: Daneil Dolin, MD;  [facility];  Service: Endoscopy;  Laterality: N/A;  2:00pm   EYE SURGERY     HAMMER TOE SURGERY Bilateral    IR RADIOLOGIST EVAL & MGMT  07/14/2021   NASAL/SINUS ENDOSCOPY     TUBAL LIGATION     tubal reversal  1991    Current Outpatient Medications  Medication Sig Dispense Refill   acetaminophen (TYLENOL) 500 MG tablet Take 1,000 mg by mouth as needed for moderate pain or headache.     Calcium Carb-Cholecalciferol (CALCIUM 600+D3 PO) Take 2 tablets by mouth every morning.      Cyanocobalamin (B-12 COMPLIANCE INJECTION) 1000 MCG/ML KIT Inject 1,000 mcg as directed every 30 (thirty) days.     docusate sodium (COLACE) 100 MG capsule Take 200 mg by mouth at bedtime.     esomeprazole (NEXIUM) 40 MG capsule Take 40 mg by mouth 2 (two) times daily.     Fezolinetant (VEOZAH) 45 MG TABS Take by mouth.     fluticasone (FLONASE) 50 MCG/ACT nasal spray Place 1 spray into both nostrils 2 (two) times daily.     hydrocortisone (ANUSOL-HC) 2.5 % rectal  cream Place 1 application. rectally 2 (two) times daily as needed for hemorrhoids or anal itching. 90 g 3   Hypertonic Nasal Wash (SINUS RINSE NA) Place 1 each into the nose 2 (two) times daily.     levothyroxine (SYNTHROID, LEVOTHROID) 50 MCG tablet Take 50 mcg by mouth daily before breakfast.     PARoxetine (PAXIL) 20 MG tablet Take 20 mg by mouth daily.     predniSONE (DELTASONE) 2.5 MG tablet Take 2.5 mg by mouth daily with breakfast.     pregabalin (LYRICA) 100 MG capsule Take 100 mg by mouth daily.     Probiotic Product (PROBIOTIC DAILY) CAPS Take 1 capsule by mouth daily.      Carboxymethylcellulose Sodium (REFRESH TEARS OP) Place 1 application into both eyes as needed (dry eyes).  (Patient not taking: Reported on 01/17/2023)     CATAPRES-TTS-1 0.1 MG/24HR patch Place onto the skin.     ferrous sulfate 324 MG TBEC Take 324 mg by mouth.     linaclotide (LINZESS) 145 MCG CAPS capsule Take 1 capsule (145 mcg total) by mouth daily before breakfast. (Patient taking differently: Take 72 mcg by mouth daily before breakfast.) 90 capsule 3   miconazole (MONISTAT 7) 2 % vaginal cream Place 1 Applicatorful vaginally 2 (two) times daily. (Patient not taking: Reported on 01/17/2023)     triamcinolone cream (KENALOG) 0.1 %  (Patient not taking: Reported on 01/17/2023)     No current facility-administered medications for this visit.    Allergies as of 01/17/2023 - Review Complete 01/17/2023  Allergen Reaction Noted   Zithromax [azithromycin] Hives and Rash 08/02/2017   Lifitegrast Itching 05/25/2018    Family History  Problem Relation Age of Onset   Other Mother    Other Father    Colon cancer Neg Hx     Social History   Socioeconomic History   Marital status: Married    Spouse name: Not on file   Number of children: Not on file   Years of education: Not on file   Highest education level: Not on file  Occupational History   Not on file  Tobacco Use   Smoking status: Never   Smokeless tobacco: Never  Vaping Use   Vaping Use: Never used  Substance and Sexual Activity   Alcohol use: No   Drug use: No   Sexual activity: Never  Other Topics Concern   Not on file  Social History Narrative   Not on file   Social Determinants of Health   Financial Resource Strain: Not on file  Food Insecurity: Not on file  Transportation Needs: Not on file  Physical Activity: Not on file  Stress: Not on file  Social Connections: Not on file  Intimate Partner Violence: Not on file     Review of Systems   Gen: Denies any fever, chills, fatigue, weight loss, lack of  appetite.  CV: Denies chest pain, heart palpitations, peripheral edema, syncope.  Resp: Denies shortness of breath at rest or with exertion. Denies wheezing or cough.  GI: Denies dysphagia or odynophagia. Denies jaundice, hematemesis, fecal incontinence. GU : Denies urinary burning, urinary frequency, urinary hesitancy MS: Denies joint pain, muscle weakness, cramps, or limitation of movement.  Derm: Denies rash, itching, dry skin Psych: Denies depression, anxiety, memory loss, and confusion Heme: Denies bruising, bleeding, and enlarged lymph nodes.   Physical Exam   BP 121/80   Pulse 99   Temp 97.9 F (36.6 C)   Ht '5\' 3"'$  (1.6  m)   Wt 146 lb 12.8 oz (66.6 kg)   BMI 26.00 kg/m  General:   Alert and oriented. Pleasant and cooperative. Well-nourished and well-developed.  Head:  Normocephalic and atraumatic. Eyes:  Without icterus Abdomen:  +BS, soft, non-tender and non-distended. No HSM noted. No guarding or rebound. No masses appreciated.  Rectal:  Deferred  Msk:  Symmetrical without gross deformities. Normal posture. Extremities:  Without edema. Neurologic:  Alert and  oriented x4;  grossly normal neurologically. Skin:  Intact without significant lesions or rashes. Psych:  Alert and cooperative. Normal mood and affect.   Assessment   Brittany Shields is a 72 y.o. female presenting today in follow-up with a history of  pancolonic diverticulosis, GERD, constipation, Grade 2 hemorrhoids s/p banding in 2021 with likely post-banding bleed, returning in follow-up.   GERD symptoms still required BID Nexium; she has been unable to decrease to once daily. No alarm signs/symptoms.  Constipation has been difficult to manage. Linzess no longer covered by insurance. Amitiza 24 mcg daily is not quite as effective; however, sometimes she has frequent stools and some days not at all. I query if dealing with an overflow at times. We will do BID for at least 1-2 weeks and then may need to change  agents.   PLAN    Amitiza 24 mcg po BID for at least 1-2 weeks If no improvement, will trial IBSrela, taking 5-10 minutes before eating, BID 6 month follow-up Continue Nexium BID   Annitta Needs, PhD, [facility] Gastroenterology

## 2023-01-25 DIAGNOSIS — Z01419 Encounter for gynecological examination (general) (routine) without abnormal findings: Secondary | ICD-10-CM | POA: Diagnosis not present

## 2023-02-06 DIAGNOSIS — R030 Elevated blood-pressure reading, without diagnosis of hypertension: Secondary | ICD-10-CM | POA: Diagnosis not present

## 2023-02-06 DIAGNOSIS — E538 Deficiency of other specified B group vitamins: Secondary | ICD-10-CM | POA: Diagnosis not present

## 2023-03-08 DIAGNOSIS — E538 Deficiency of other specified B group vitamins: Secondary | ICD-10-CM | POA: Diagnosis not present

## 2023-03-08 DIAGNOSIS — R030 Elevated blood-pressure reading, without diagnosis of hypertension: Secondary | ICD-10-CM | POA: Diagnosis not present

## 2023-04-05 DIAGNOSIS — R92323 Mammographic fibroglandular density, bilateral breasts: Secondary | ICD-10-CM | POA: Diagnosis not present

## 2023-04-05 DIAGNOSIS — N6489 Other specified disorders of breast: Secondary | ICD-10-CM | POA: Diagnosis not present

## 2023-04-07 DIAGNOSIS — R030 Elevated blood-pressure reading, without diagnosis of hypertension: Secondary | ICD-10-CM | POA: Diagnosis not present

## 2023-04-07 DIAGNOSIS — E538 Deficiency of other specified B group vitamins: Secondary | ICD-10-CM | POA: Diagnosis not present

## 2023-04-25 DIAGNOSIS — K2100 Gastro-esophageal reflux disease with esophagitis, without bleeding: Secondary | ICD-10-CM | POA: Diagnosis not present

## 2023-04-25 DIAGNOSIS — N183 Chronic kidney disease, stage 3 unspecified: Secondary | ICD-10-CM | POA: Diagnosis not present

## 2023-04-25 DIAGNOSIS — E7849 Other hyperlipidemia: Secondary | ICD-10-CM | POA: Diagnosis not present

## 2023-05-02 DIAGNOSIS — Z6824 Body mass index (BMI) 24.0-24.9, adult: Secondary | ICD-10-CM | POA: Diagnosis not present

## 2023-05-02 DIAGNOSIS — M797 Fibromyalgia: Secondary | ICD-10-CM | POA: Diagnosis not present

## 2023-05-02 DIAGNOSIS — F331 Major depressive disorder, recurrent, moderate: Secondary | ICD-10-CM | POA: Diagnosis not present

## 2023-05-02 DIAGNOSIS — G43909 Migraine, unspecified, not intractable, without status migrainosus: Secondary | ICD-10-CM | POA: Diagnosis not present

## 2023-05-02 DIAGNOSIS — M3130 Wegener's granulomatosis without renal involvement: Secondary | ICD-10-CM | POA: Diagnosis not present

## 2023-05-02 DIAGNOSIS — R011 Cardiac murmur, unspecified: Secondary | ICD-10-CM | POA: Diagnosis not present

## 2023-05-02 DIAGNOSIS — E039 Hypothyroidism, unspecified: Secondary | ICD-10-CM | POA: Diagnosis not present

## 2023-05-02 DIAGNOSIS — R030 Elevated blood-pressure reading, without diagnosis of hypertension: Secondary | ICD-10-CM | POA: Diagnosis not present

## 2023-05-02 DIAGNOSIS — K2100 Gastro-esophageal reflux disease with esophagitis, without bleeding: Secondary | ICD-10-CM | POA: Diagnosis not present

## 2023-05-02 DIAGNOSIS — M180 Bilateral primary osteoarthritis of first carpometacarpal joints: Secondary | ICD-10-CM | POA: Diagnosis not present

## 2023-05-02 DIAGNOSIS — I350 Nonrheumatic aortic (valve) stenosis: Secondary | ICD-10-CM | POA: Diagnosis not present

## 2023-05-02 DIAGNOSIS — E7849 Other hyperlipidemia: Secondary | ICD-10-CM | POA: Diagnosis not present

## 2023-05-08 ENCOUNTER — Encounter (INDEPENDENT_AMBULATORY_CARE_PROVIDER_SITE_OTHER): Payer: Medicare Other | Admitting: Ophthalmology

## 2023-05-08 ENCOUNTER — Encounter (INDEPENDENT_AMBULATORY_CARE_PROVIDER_SITE_OTHER): Payer: Self-pay

## 2023-05-08 DIAGNOSIS — H3561 Retinal hemorrhage, right eye: Secondary | ICD-10-CM | POA: Diagnosis not present

## 2023-05-08 DIAGNOSIS — H348112 Central retinal vein occlusion, right eye, stable: Secondary | ICD-10-CM | POA: Diagnosis not present

## 2023-05-08 DIAGNOSIS — H43812 Vitreous degeneration, left eye: Secondary | ICD-10-CM | POA: Diagnosis not present

## 2023-05-08 DIAGNOSIS — H04123 Dry eye syndrome of bilateral lacrimal glands: Secondary | ICD-10-CM | POA: Diagnosis not present

## 2023-05-08 DIAGNOSIS — H35372 Puckering of macula, left eye: Secondary | ICD-10-CM | POA: Diagnosis not present

## 2023-05-08 DIAGNOSIS — H18463 Peripheral corneal degeneration, bilateral: Secondary | ICD-10-CM | POA: Diagnosis not present

## 2023-05-09 DIAGNOSIS — E538 Deficiency of other specified B group vitamins: Secondary | ICD-10-CM | POA: Diagnosis not present

## 2023-05-24 DIAGNOSIS — H532 Diplopia: Secondary | ICD-10-CM | POA: Diagnosis not present

## 2023-05-24 DIAGNOSIS — H02886 Meibomian gland dysfunction of left eye, unspecified eyelid: Secondary | ICD-10-CM | POA: Diagnosis not present

## 2023-05-24 DIAGNOSIS — H1849 Other corneal degeneration: Secondary | ICD-10-CM | POA: Diagnosis not present

## 2023-05-24 DIAGNOSIS — H04123 Dry eye syndrome of bilateral lacrimal glands: Secondary | ICD-10-CM | POA: Diagnosis not present

## 2023-05-24 DIAGNOSIS — Z961 Presence of intraocular lens: Secondary | ICD-10-CM | POA: Diagnosis not present

## 2023-05-24 DIAGNOSIS — H02883 Meibomian gland dysfunction of right eye, unspecified eyelid: Secondary | ICD-10-CM | POA: Diagnosis not present

## 2023-06-06 ENCOUNTER — Encounter (HOSPITAL_BASED_OUTPATIENT_CLINIC_OR_DEPARTMENT_OTHER): Payer: Self-pay | Admitting: Pulmonary Disease

## 2023-06-06 ENCOUNTER — Ambulatory Visit (INDEPENDENT_AMBULATORY_CARE_PROVIDER_SITE_OTHER): Payer: Medicare Other | Admitting: Pulmonary Disease

## 2023-06-06 VITALS — BP 120/68 | HR 94 | Resp 24 | Ht 63.0 in | Wt 147.0 lb

## 2023-06-06 DIAGNOSIS — Z789 Other specified health status: Secondary | ICD-10-CM | POA: Diagnosis not present

## 2023-06-06 DIAGNOSIS — G4733 Obstructive sleep apnea (adult) (pediatric): Secondary | ICD-10-CM | POA: Diagnosis not present

## 2023-06-06 NOTE — Progress Notes (Addendum)
[place] [facility], Critical Care, and Sleep Medicine  Chief Complaint  Patient presents with   New Patient (Initial Visit)    Having issues w/ snoring, pt is using  c-pap  that hurts her nose that she not able to wear though the night.     Past Surgical History:  She  has a past surgical history that includes Tubal ligation; tubal reversal (1991); Carpal tunnel release (Right, 2001); Cholecystectomy; Nasal/sinus endoscopy; Hammer toe surgery (Bilateral); Cyst excision; Cataract extraction w/PHACO (Right, 08/08/2017); Colonoscopy; Eye surgery; Anterior cervical decomp/discectomy fusion (N/A, 08/28/2017); Cataract extraction w/PHACO (Left, 11/10/2017); Esophagogastroduodenoscopy (N/A, 01/24/2020); biopsy (01/24/2020); Colonoscopy (N/A, 04/03/2020); and IR Radiologist Eval & Mgmt (07/14/2021).  Past Medical History:  Arthritis, Headaches, Chronic sinusitis, GERD, Hypothyroidism, LPR, Dry eye, Retinal vein occlusion, Meibomian gland disease, Sensori-neuronal hearing loss, Murmur  Constitutional:  BP 120/68   Pulse 94   Resp (!) 24   Ht 5\' 3"  (1.6 m)   Wt 147 lb (66.7 kg)   SpO2 97%   BMI 26.04 kg/m   Brief Summary:  Brittany Shields is a 72 y.o. female with obstructive sleep apnea.      Subjective:   She is here with her husband.  She had a sleep study in 2013 at [facility].  She then had another home sleep study through her PCP in Summer of 2023.  She was started on CPAP.  She has trouble using CPAP masks.  She has tried multiple types of masks.  She is followed by ENT for chronic sinusitis, and had several sinus procedures.  Wearing CPAP masks causes sinus irritation.  She has a history of TMJ and has an oral appliance.  She has an adjustable bed and sleeps with her upper body elevated.  She feels her sleep is best when she is sleeping in a recliner.  When she lays on her back in bed she snores and stops breathing.  This doesn't happen as much when she is on her side, but then  her hips start hurting.  She goes to sleep at 11 pm.  She falls asleep quickly when she doesn't use CPAP, but struggles to fall asleep when using CPAP.  She wakes up 2 or 3 times to use the bathroom.  She gets out of bed at 7 am.  She feels okay in the morning, but feels better if she doesn't use her CPAP.  She denies morning headache.  She does not use anything to help her fall sleep or stay awake.  She denies sleep walking, or nightmares.  There is no history of restless legs.  She denies sleep hallucinations, sleep paralysis, or cataplexy.  The Epworth score is 9 out of 24.   Physical Exam:   Appearance - well kempt   ENMT - no sinus tenderness, no oral exudate, no LAN, Mallampati 3 airway, no stridor, overbite, click with jaw movement  Respiratory - equal breath sounds bilaterally, no wheezing or rales  CV - s1s2 regular rate and rhythm, 2/6 SM  Ext - no clubbing, no edema  Skin - no rashes  Psych - normal mood and affect   Sleep Tests:    Social History:  She  reports that she has never smoked. She has never used smokeless tobacco. She reports that she does not drink alcohol and does not use drugs.  Family History:  Her family history includes Other in her father and mother.    Discussion:  She has history of obstructive sleep apnea.  She  also has chronic sinusitis and TMJ.  She has not been able to tolerated CPAP due to issues with mask fit and her sinuses.  It is uncertain whether she could be a candidate for a mandibular advancement device.  She would be interested in a hypoglossal nerve stimulator if she is a candidate.  Assessment/Plan:   Obstructive sleep apnea. - discussed how sleep apnea can impact her health - driving precautions reviewed - discussed positional therapy - intolerant of CPAP - asked her to check with her dentist if she might be a candidate for a mandibular advancement device - will get a copy of her home sleep study from the Summer of 2023;  explained she would need to have moderate to severe obstructive sleep apnea to be a candidate for further assessment regarding a hypoglossal nerve stimulator  Chronic sinusitis. - followed by Dr. Rosine Door with ENT at [facility] - remains on prednisone, nasal irrigation and nasal gel  Laryngopharyngeal reflux. - remains on nexium  Murmur. - she will follow up with her PCP to repeat Echo  Time Spent Involved in Patient Care on Day of Examination:  38 minutes  Follow up:   Patient Instructions  Will get a copy of your sleep study from 2023 and call you with plan after reviewing this.  Follow up in 3 months.  Medication List:   Allergies as of 06/06/2023       Reactions   Zithromax [azithromycin] Hives, Rash   Lifitegrast Itching   Burning pain *Xiidra        Medication List        Accurate as of June 06, 2023  3:02 PM. If you have any questions, ask your nurse or doctor.          acetaminophen 500 MG tablet Commonly known as: TYLENOL Take 1,000 mg by mouth as needed for moderate pain or headache.   B-12 Compliance Injection 1000 MCG/ML Kit Generic drug: Cyanocobalamin Inject 1,000 mcg as directed every 30 (thirty) days.   CALCIUM 600+D3 PO Take 2 tablets by mouth every morning.   docusate sodium 100 MG capsule Commonly known as: COLACE Take 200 mg by mouth at bedtime.   esomeprazole 40 MG capsule Commonly known as: NEXIUM Take 40 mg by mouth 2 (two) times daily.   fluticasone 50 MCG/ACT nasal spray Commonly known as: FLONASE Place 1 spray into both nostrils 2 (two) times daily.   hydrocortisone 2.5 % rectal cream Commonly known as: ANUSOL-HC Place 1 application. rectally 2 (two) times daily as needed for hemorrhoids or anal itching.   levothyroxine 50 MCG tablet Commonly known as: SYNTHROID Take 50 mcg by mouth daily before breakfast.   lubiprostone 24 MCG capsule Commonly known as: AMITIZA Take 24 mcg by mouth 2 (two) times daily  with a meal.   PARoxetine 20 MG tablet Commonly known as: PAXIL Take 20 mg by mouth daily.   predniSONE 2.5 MG tablet Commonly known as: DELTASONE Take 2.5 mg by mouth daily with breakfast.   pregabalin 100 MG capsule Commonly known as: LYRICA Take 100 mg by mouth daily.   Probiotic Daily Caps Take 1 capsule by mouth daily.   SINUS RINSE NA Place 1 each into the nose 2 (two) times daily.   Veozah 45 MG Tabs Generic drug: Fezolinetant Take by mouth.        Signature:  Coralyn Helling, MD [facility] Pulmonary/Critical Care Pager - [phone] 06/06/2023, 3:02 PM

## 2023-06-06 NOTE — Patient Instructions (Signed)
Will get a copy of your sleep study from 2023 and call you with plan after reviewing this.  Follow up in 3 months.

## 2023-06-08 DIAGNOSIS — E538 Deficiency of other specified B group vitamins: Secondary | ICD-10-CM | POA: Diagnosis not present

## 2023-06-08 DIAGNOSIS — R030 Elevated blood-pressure reading, without diagnosis of hypertension: Secondary | ICD-10-CM | POA: Diagnosis not present

## 2023-06-09 ENCOUNTER — Telehealth: Payer: Self-pay | Admitting: Pulmonary Disease

## 2023-06-09 DIAGNOSIS — Z789 Other specified health status: Secondary | ICD-10-CM

## 2023-06-09 DIAGNOSIS — G4733 Obstructive sleep apnea (adult) (pediatric): Secondary | ICD-10-CM

## 2023-06-09 NOTE — Telephone Encounter (Signed)
Spoke with pt and notified of results per Dr. Craige Cotta. Pt verbalized understanding and denied any questions. She wanted referral for [facility] and this was done  Nothing further needed

## 2023-06-09 NOTE — Telephone Encounter (Signed)
HST 08/01/22 >> AHI 16.5, SpO2 low 82%   Please inform her that her sleep study was reviewed and shows moderate obstructive sleep apnea.  If she is still interested in the [place] device, then please put in a referral to ENT.

## 2023-07-07 DIAGNOSIS — E538 Deficiency of other specified B group vitamins: Secondary | ICD-10-CM | POA: Diagnosis not present

## 2023-07-07 DIAGNOSIS — R030 Elevated blood-pressure reading, without diagnosis of hypertension: Secondary | ICD-10-CM | POA: Diagnosis not present

## 2023-07-12 DIAGNOSIS — Z5189 Encounter for other specified aftercare: Secondary | ICD-10-CM | POA: Diagnosis not present

## 2023-07-12 DIAGNOSIS — R232 Flushing: Secondary | ICD-10-CM | POA: Diagnosis not present

## 2023-07-12 DIAGNOSIS — N951 Menopausal and female climacteric states: Secondary | ICD-10-CM | POA: Diagnosis not present

## 2023-07-18 ENCOUNTER — Ambulatory Visit (INDEPENDENT_AMBULATORY_CARE_PROVIDER_SITE_OTHER): Payer: Medicare Other | Admitting: Gastroenterology

## 2023-07-18 ENCOUNTER — Encounter: Payer: Self-pay | Admitting: Gastroenterology

## 2023-07-18 VITALS — BP 139/87 | HR 102 | Temp 97.3°F | Ht 63.0 in | Wt 150.2 lb

## 2023-07-18 DIAGNOSIS — K5900 Constipation, unspecified: Secondary | ICD-10-CM | POA: Diagnosis not present

## 2023-07-18 DIAGNOSIS — K219 Gastro-esophageal reflux disease without esophagitis: Secondary | ICD-10-CM | POA: Diagnosis not present

## 2023-07-18 DIAGNOSIS — K625 Hemorrhage of anus and rectum: Secondary | ICD-10-CM | POA: Diagnosis not present

## 2023-07-18 MED ORDER — HYDROCORTISONE (PERIANAL) 2.5 % EX CREA: 1.0000 | TOPICAL_CREAM | Freq: Two times a day (BID) | CUTANEOUS | 3 refills | Status: AC | PRN

## 2023-07-18 NOTE — Patient Instructions (Signed)
I am discussing with Dr. Levon Hedger the reflux procedure to see if you would be a good candidate!  Otherwise, we will see you 1 year!  I have refilled the hemorrhoid cream for you. Try to use sparingly, as it can thin the tissue.   I enjoyed seeing you again today! I value our relationship and want to provide genuine, compassionate, and quality care. You may receive a survey regarding your visit with me, and I welcome your feedback! Thanks so much for taking the time to complete this. I look forward to seeing you again.      Gelene Mink, PhD, ANP-BC [facility]

## 2023-07-18 NOTE — Progress Notes (Signed)
Gastroenterology Office Note     Primary Care Physician:  Richardean Chimera, MD  Primary Gastroenterologist: Dr. Jena Gauss    Chief Complaint   Chief Complaint  Patient presents with   Follow-up    Patient here today for a follow up on her gerd. She is taking esomeprazole 40 mg bid and this controls her symptoms. She is also says she gets occasional constipation she is taking amitiza 24 mcg bid, she is not currently having any issues with this.     History of Present Illness   Brittany Shields is a 72 y.o. female presenting today with history of arthritis, hypothyroidism, OSA, TMJ, LPR, GERD, constipation, Grade 2 hemorrhoids s/p banding in 2021 with likely post-banding bleed, returning in follow-up.  Hemorrhoids continue to bother her occasionally. Rare scant bleeding. She would prefer not to attempt banding again. Using anusol cream as needed. Amitiza 24 mcg po BID working well for constipation. Rarely may have more frequent stools but for most part does very well. She is unable to wean to once a day Nexium. Continues on this BID. She is interested in TIF procedure.    Colonoscopy May 2021: pancolonic diverticulosis, non-bleeding external and internal hemorrhoids.  EGD March 2021: normal esophagus, multiple gastric polyps s/p biopsy, gastric erosions, normal duodenum. (Fundic gland polyps, negative H.pylori).    Past Medical History:  Diagnosis Date   Arthritis    Chronic sinusitis    Dry eye    Generalized headaches    GERD (gastroesophageal reflux disease)    Hypothyroidism    Laryngopharyngeal reflux (LPR)    Meibomian gland disease    Murmur    OSA (obstructive sleep apnea)    Retinal vein occlusion    Sensorineural hearing loss    TMJ arthralgia     Past Surgical History:  Procedure Laterality Date   ANTERIOR CERVICAL DECOMP/DISCECTOMY FUSION N/A 08/28/2017   Procedure: ANTERIOR CERVICAL DECOMPRESSION AND FUSION CERVICAL FIVE-SIX,CERVICAL SIX-SEVEN;  Surgeon:  Julio Sicks, MD;  [facility]: [facility] OR;  Service: Neurosurgery;  Laterality: N/A;  anterior   BIOPSY  01/24/2020   Procedure: BIOPSY;  Surgeon: Corbin Ade, MD;  [facility];  Service: Endoscopy;;  gastric polyp; gastric mucosa   CARPAL TUNNEL RELEASE Right 2001   CATARACT EXTRACTION W/PHACO Right 08/08/2017   Procedure: CATARACT EXTRACTION PHACO AND INTRAOCULAR LENS PLACEMENT (IOC);  Surgeon: Jethro Bolus, MD;  Location: [facility] ORS;  Service: Ophthalmology;  Laterality: Right;  CDE: 4.18   CATARACT EXTRACTION W/PHACO Left 11/10/2017   Procedure: CATARACT EXTRACTION PHACO AND INTRAOCULAR LENS PLACEMENT (IOC);  Surgeon: Jethro Bolus, MD;  Location: [facility] ORS;  Service: Ophthalmology;  Laterality: Left;  CDE: 3.27   CHOLECYSTECTOMY     COLONOSCOPY     COLONOSCOPY N/A 04/03/2020   Procedure: COLONOSCOPY;  Surgeon: Corbin Ade, MD;  [facility];  Service: Endoscopy;  Laterality: N/A;  10:00am   CYST EXCISION     right eye   ESOPHAGOGASTRODUODENOSCOPY N/A 01/24/2020   Procedure: ESOPHAGOGASTRODUODENOSCOPY (EGD);  Surgeon: Corbin Ade, MD;  [facility];  Service: Endoscopy;  Laterality: N/A;  2:00pm   EYE SURGERY     HAMMER TOE SURGERY Bilateral    IR RADIOLOGIST EVAL & MGMT  07/14/2021   NASAL/SINUS ENDOSCOPY     TUBAL LIGATION     tubal reversal  1991    Current Outpatient Medications  Medication Sig Dispense Refill   acetaminophen (TYLENOL) 500 MG tablet Take 1,000 mg by  mouth as needed for moderate pain or headache.     Calcium Carb-Cholecalciferol (CALCIUM 600+D3 PO) Take 2 tablets by mouth every morning.      Cyanocobalamin (B-12 COMPLIANCE INJECTION) 1000 MCG/ML KIT Inject 1,000 mcg as directed every 30 (thirty) days.     docusate sodium (COLACE) 100 MG capsule Take 200 mg by mouth at bedtime.     esomeprazole (NEXIUM) 40 MG capsule Take 40 mg by mouth 2 (two) times daily.     ferrous sulfate 324 MG TBEC Take 324 mg by mouth daily.     Fezolinetant  (VEOZAH) 45 MG TABS Take by mouth.     fluticasone (FLONASE) 50 MCG/ACT nasal spray Place 1 spray into both nostrils 2 (two) times daily.     hydrocortisone (ANUSOL-HC) 2.5 % rectal cream Place 1 application. rectally 2 (two) times daily as needed for hemorrhoids or anal itching. 90 g 3   Hypertonic Nasal Wash (SINUS RINSE NA) Place 1 each into the nose 2 (two) times daily.     levothyroxine (SYNTHROID, LEVOTHROID) 50 MCG tablet Take 50 mcg by mouth daily before breakfast.     lubiprostone (AMITIZA) 24 MCG capsule Take 24 mcg by mouth 2 (two) times daily with a meal.     predniSONE (DELTASONE) 5 MG tablet Take 5 mg by mouth daily with breakfast.     pregabalin (LYRICA) 100 MG capsule Take 100 mg by mouth daily.     Probiotic Product (PROBIOTIC DAILY) CAPS Take 1 capsule by mouth daily.     triamcinolone cream (KENALOG) 0.1 % Apply 1 Application topically as needed.     No current facility-administered medications for this visit.    Allergies as of 07/18/2023 - Review Complete 07/18/2023  Allergen Reaction Noted   Zithromax [azithromycin] Hives and Rash 08/02/2017   Lifitegrast Itching 05/25/2018    Family History  Problem Relation Age of Onset   Other Mother    Other Father    Colon cancer Neg Hx     Social History   Socioeconomic History   Marital status: Married    Spouse name: Not on file   Number of children: Not on file   Years of education: Not on file   Highest education level: Not on file  Occupational History   Not on file  Tobacco Use   Smoking status: Never   Smokeless tobacco: Never  Vaping Use   Vaping status: Never Used  Substance and Sexual Activity   Alcohol use: No   Drug use: No   Sexual activity: Never  Other Topics Concern   Not on file  Social History Narrative   Not on file   Social Determinants of Health   Financial Resource Strain: Not on file  Food Insecurity: Not on file  Transportation Needs: Not on file  Physical Activity:  Sufficiently Active (01/25/2023)   Received from [facility], [facility]   Exercise Vital Sign    Days of Exercise per Week: 5 days    Minutes of Exercise per Session: 40 min  Stress: No Stress Concern Present (01/25/2023)   Received from [facility], [facility] of Occupational Health - Occupational Stress Questionnaire    Feeling of Stress : Not at all  Social Connections: Not on file  Intimate Partner Violence: Not At Risk (01/25/2023)   Received from [facility], [facility]   Humiliation, Afraid, Rape, and Kick questionnaire    Fear of Current  or Ex-Partner: No    Emotionally Abused: No    Physically Abused: No    Sexually Abused: No     Review of Systems   Gen: Denies any fever, chills, fatigue, weight loss, lack of appetite.  CV: Denies chest pain, heart palpitations, peripheral edema, syncope.  Resp: Denies shortness of breath at rest or with exertion. Denies wheezing or cough.  GI: Denies dysphagia or odynophagia. Denies jaundice, hematemesis, fecal incontinence. GU : Denies urinary burning, urinary frequency, urinary hesitancy MS: Denies joint pain, muscle weakness, cramps, or limitation of movement.  Derm: Denies rash, itching, dry skin Psych: Denies depression, anxiety, memory loss, and confusion Heme: Denies bruising, bleeding, and enlarged lymph nodes.   Physical Exam   BP 139/87 (BP Location: Left Arm, Patient Position: Sitting, Cuff Size: Normal)   Pulse (!) 102   Temp (!) 97.3 F (36.3 C) (Temporal)   Ht 5\' 3"  (1.6 m)   Wt 150 lb 3.2 oz (68.1 kg)   BMI 26.61 kg/m  General:   Alert and oriented. Pleasant and cooperative. Well-nourished and well-developed.  Head:  Normocephalic and atraumatic. Eyes:  Without icterus Rectal:  non-thrombosed external hemorrhoids and tags, DRE without mass. Prominence of hemorrhoid columns noted but no mass or pain.  Msk:  Symmetrical without gross deformities. Normal  posture. Extremities:  Without edema. Neurologic:  Alert and  oriented x4;  grossly normal neurologically. Skin:  Intact without significant lesions or rashes. Psych:  Alert and cooperative. Normal mood and affect.   Assessment   Brittany Shields is a 72 y.o. female presenting today with a history of  arthritis, hypothyroidism, OSA, TMJ, LPR, GERD, constipation, Grade 2 hemorrhoids s/p banding in 2021 with likely post-banding bleed, returning in follow-up.  Constipation well-managed with Amitza 24 mcg po BID. Will continue this. Colonoscopy on file from 2021 with pancolonic diverticulosis and hemorrhoids.  She continues to require BID Nexium dosing to control GERD symptoms. Last EGD in 2021 with normal esophagus and multiple gastric polyps (negative H.pylori.). She is interested in candidacy for TIF procedure. Will discuss with Dr. Levon Hedger; she will need updated EGD likely.     PLAN   Continue Amitiza 24 mcg po BID Continue Nexium BID for now As doing well, return for routine visit in 1 year Will reach out to Dr. Levon Hedger regarding TIF procedure candidacy   Gelene Mink, PhD, ANP-BC [facility]

## 2023-07-19 NOTE — Progress Notes (Signed)
Patient is sch'd for 07/20/23 at 230 and is aware of apt

## 2023-07-20 ENCOUNTER — Ambulatory Visit (INDEPENDENT_AMBULATORY_CARE_PROVIDER_SITE_OTHER): Payer: Medicare Other | Admitting: Gastroenterology

## 2023-07-20 ENCOUNTER — Encounter (INDEPENDENT_AMBULATORY_CARE_PROVIDER_SITE_OTHER): Payer: Self-pay | Admitting: Gastroenterology

## 2023-07-20 VITALS — BP 147/84 | HR 80 | Temp 98.0°F | Ht 63.0 in | Wt 145.9 lb

## 2023-07-20 DIAGNOSIS — K219 Gastro-esophageal reflux disease without esophagitis: Secondary | ICD-10-CM

## 2023-07-20 DIAGNOSIS — H518 Other specified disorders of binocular movement: Secondary | ICD-10-CM | POA: Diagnosis not present

## 2023-07-20 NOTE — Progress Notes (Signed)
Brittany Shields, M.D. Gastroenterology & Hepatology [facility] [address] [place], [place] [address] Primary Care Physician: Richardean Chimera, MD [address] [employer] [address]  Problems: GERD 2. LPR  History of Present Illness: Brittany Shields is a 72 y.o. female with past medical history of arthritis, sinusitis, GERD, hypothyroidism, LPR, OSA, IBS, hemorrhoidal bleeding s/p banding,  who presents for evaluation of GERD.  Patient reports she has had GERD for at least 20 years. She reports having issues with regurgitation frequently and having hearburn in the past. She reports trying Prevacid in the past, which did not help. She was eventually switched to Nexium Bid, which was effective controlling her symptoms, but her insurance stopped covering the name brand and she was switched to generic esomeprazole BID, which she is currently taking. She reports that as long as she takes the medication compliantly, she does not have heartburn or regurgitation, denies having any dysphagia at all.  The patient denies having any nausea, vomiting, fever, chills, hematochezia, melena, hematemesis, abdominal distention, abdominal pain, diarrhea, jaundice, pruritus or weight loss.  GERD-HRQL Questionnaire score - based on recall of symptoms prior to starting PPI: 15  Last EGD:01/24/2020 - Normal esophagus. - Multiple gastric polyps. Status post biopsy. Gastric erosions. Status post biopsy - Normal duodenal bulb and second portion of the duodenum.  Past Medical History: Past Medical History:  Diagnosis Date   Arthritis    Chronic sinusitis    Dry eye    Generalized headaches    GERD (gastroesophageal reflux disease)    Hypothyroidism    Laryngopharyngeal reflux (LPR)    Meibomian gland disease    Murmur    OSA (obstructive sleep apnea)    Retinal vein occlusion    Sensorineural hearing loss    TMJ arthralgia     Past Surgical History: Past  Surgical History:  Procedure Laterality Date   ANTERIOR CERVICAL DECOMP/DISCECTOMY FUSION N/A 08/28/2017   Procedure: ANTERIOR CERVICAL DECOMPRESSION AND FUSION CERVICAL FIVE-SIX,CERVICAL SIX-SEVEN;  Surgeon: Julio Sicks, MD;  Location: [facility] OR;  Service: Neurosurgery;  Laterality: N/A;  anterior   BIOPSY  01/24/2020   Procedure: BIOPSY;  Surgeon: Corbin Ade, MD;  [facility];  Service: Endoscopy;;  gastric polyp; gastric mucosa   CARPAL TUNNEL RELEASE Right 2001   CATARACT EXTRACTION W/PHACO Right 08/08/2017   Procedure: CATARACT EXTRACTION PHACO AND INTRAOCULAR LENS PLACEMENT (IOC);  Surgeon: Jethro Bolus, MD;  Location: [facility] ORS;  Service: Ophthalmology;  Laterality: Right;  CDE: 4.18   CATARACT EXTRACTION W/PHACO Left 11/10/2017   Procedure: CATARACT EXTRACTION PHACO AND INTRAOCULAR LENS PLACEMENT (IOC);  Surgeon: Jethro Bolus, MD;  Location: [facility] OR[facility];  Service: Ophthalmology;  Laterality: Left;  CDE: 3.27   CHOLECYSTECTOMY     COLONOSCOPY     COLONOSCOPY N/A 04/03/2020   Procedure: COLONOSCOPY;  Surgeon: Corbin Ade, MD;  [facility];  Service: Endoscopy;  Laterality: N/A;  10:00am   CYST EXCISION     right eye   ESOPHAGOGASTRODUODENOSCOPY N/A 01/24/2020   Procedure: ESOPHAGOGASTRODUODENOSCOPY (EGD);  Surgeon: Corbin Ade, MD;  [facility];  Service: Endoscopy;  Laterality: N/A;  2:00pm   EYE SURGERY     HAMMER TOE SURGERY Bilateral    IR RADIOLOGIST EVAL & MGMT  07/14/2021   NASAL/SINUS ENDOSCOPY     TUBAL LIGATION     tubal reversal  1991    Family History: Family History  Problem Relation Age of Onset  Other Mother    Other Father    Colon cancer Neg Hx     Social History: Social History   Tobacco Use  Smoking Status Never   Passive exposure: Never  Smokeless Tobacco Never   Social History   Substance and Sexual Activity  Alcohol Use No   Social History   Substance and Sexual Activity  Drug Use No     Allergies: Allergies  Allergen Reactions   Zithromax [Azithromycin] Hives and Rash   Lifitegrast Itching    Burning pain *Xiidra    Medications: Current Outpatient Medications  Medication Sig Dispense Refill   acetaminophen (TYLENOL) 500 MG tablet Take 1,000 mg by mouth as needed for moderate pain or headache.     Calcium Carb-Cholecalciferol (CALCIUM 600+D3 PO) Take 2 tablets by mouth every morning.      Cyanocobalamin (B-12 COMPLIANCE INJECTION) 1000 MCG/ML KIT Inject 1,000 mcg as directed every 30 (thirty) days.     docusate sodium (COLACE) 100 MG capsule Take 200 mg by mouth at bedtime.     esomeprazole (NEXIUM) 40 MG capsule Take 40 mg by mouth 2 (two) times daily.     ferrous sulfate 324 MG TBEC Take 324 mg by mouth daily.     Fezolinetant (VEOZAH) 45 MG TABS Take by mouth.     fluticasone (FLONASE) 50 MCG/ACT nasal spray Place 1 spray into both nostrils 2 (two) times daily.     hydrocortisone (ANUSOL-HC) 2.5 % rectal cream Place 1 Application rectally 2 (two) times daily as needed for hemorrhoids or anal itching. 90 g 3   Hypertonic Nasal Wash (SINUS RINSE NA) Place 1 each into the nose 2 (two) times daily.     levothyroxine (SYNTHROID, LEVOTHROID) 50 MCG tablet Take 50 mcg by mouth daily before breakfast.     lubiprostone (AMITIZA) 24 MCG capsule Take 24 mcg by mouth 2 (two) times daily with a meal.     predniSONE (DELTASONE) 5 MG tablet Take 5 mg by mouth daily with breakfast.     pregabalin (LYRICA) 100 MG capsule Take 100 mg by mouth daily.     Probiotic Product (PROBIOTIC DAILY) CAPS Take 1 capsule by mouth daily.     triamcinolone cream (KENALOG) 0.1 % Apply 1 Application topically as needed.     No current facility-administered medications for this visit.    Review of Systems: GENERAL: negative for malaise, night sweats HEENT: No changes in hearing or vision, no nose bleeds or other nasal problems. NECK: Negative for lumps, goiter, pain and significant neck  swelling RESPIRATORY: Negative for cough, wheezing CARDIOVASCULAR: Negative for chest pain, leg swelling, palpitations, orthopnea GI: SEE HPI MUSCULOSKELETAL: Negative for joint pain or swelling, back pain, and muscle pain. SKIN: Negative for lesions, rash PSYCH: Negative for sleep disturbance, mood disorder and recent psychosocial stressors. HEMATOLOGY Negative for prolonged bleeding, bruising easily, and swollen nodes. ENDOCRINE: Negative for cold or heat intolerance, polyuria, polydipsia and goiter. NEURO: negative for tremor, gait imbalance, syncope and seizures. The remainder of the review of systems is noncontributory.   Physical Exam: BP (!) 147/84   Pulse 80   Temp 98 F (36.7 C) (Oral)   Ht 5\' 3"  (1.6 m)   Wt 145 lb 14.4 oz (66.2 kg)   BMI 25.85 kg/m  GENERAL: The patient is AO x3, in no acute distress. HEENT: Head is normocephalic and atraumatic. EOMI are intact. Mouth is well hydrated and without lesions. NECK: Supple. No masses LUNGS: Clear to auscultation. No presence of  rhonchi/wheezing/rales. Adequate chest expansion HEART: RRR, normal s1 and s2. ABDOMEN: Soft, nontender, no guarding, no peritoneal signs, and nondistended. BS +. No masses. EXTREMITIES: Without any cyanosis, clubbing, rash, lesions or edema. NEUROLOGIC: AOx3, no focal motor deficit. SKIN: no jaundice, no rashes   Imaging/Labs: as above  I personally reviewed and interpreted the available labs, imaging and endoscopic files.  Impression and Plan: Brittany Shields is a 72 y.o. female with past medical history of arthritis, sinusitis, GERD, hypothyroidism, LPR, OSA, IBS, hemorrhoidal bleeding s/p banding,  who presents for evaluation of GERD.  Patient has had longstanding GERD symptoms, which have been controlled with PPI BID, but this recurred with lower doses. She came to the clinic to discuss other options for GERD treatment, as she would like to be off medications since she has been on these for  long time.  The patient and I held a thorough discussion about potential nonpharmacologic treatments for reflux such as transoral Incisionless Fundoplication (TIF).  The details of the procedure, benefits and risks, as well as prognosis with this intervention was thoroughly discussed with the patient who understood and agreed.  Dietary modifications and post procedural recommendations were also discussed the patient.  The patient will read more about this procedure. Pamphlet provided. She would like to proceed with TIF possibly in early 2025.  -Continue esomeprazole 40 mg twice a day -If interested in pursuing TIF, please call back to schedule EGD with [facility] testing off PPI for 10 days and esophagram  All questions were answered.      Brittany Blazing, MD Gastroenterology and Hepatology [facility]

## 2023-07-20 NOTE — Patient Instructions (Signed)
Continue esomeprazole 40 mg twice a day The patient and I held a thorough discussion about potential nonpharmacologic treatments for reflux such as transoral Incisionless Fundoplication (TIF).  The details of the procedure, benefits and risks, as well as prognosis with this intervention was thoroughly discussed with the patient who understood and agreed.  Dietary modifications and post procedural recommendations were also discussed the patient.  The patient will read more about this procedure. Pamphlet provided. If interested in pursuing TIF, please call back to schedule EGD with [facility] testing off PPI for 10 days and esophagram

## 2023-07-22 DIAGNOSIS — R109 Unspecified abdominal pain: Secondary | ICD-10-CM | POA: Diagnosis not present

## 2023-07-22 DIAGNOSIS — R112 Nausea with vomiting, unspecified: Secondary | ICD-10-CM | POA: Diagnosis not present

## 2023-07-22 DIAGNOSIS — N200 Calculus of kidney: Secondary | ICD-10-CM | POA: Diagnosis not present

## 2023-07-22 DIAGNOSIS — Z9842 Cataract extraction status, left eye: Secondary | ICD-10-CM | POA: Diagnosis not present

## 2023-07-22 DIAGNOSIS — Z9841 Cataract extraction status, right eye: Secondary | ICD-10-CM | POA: Diagnosis not present

## 2023-07-22 DIAGNOSIS — Z9049 Acquired absence of other specified parts of digestive tract: Secondary | ICD-10-CM | POA: Diagnosis not present

## 2023-07-22 DIAGNOSIS — K5732 Diverticulitis of large intestine without perforation or abscess without bleeding: Secondary | ICD-10-CM | POA: Diagnosis not present

## 2023-07-22 DIAGNOSIS — Z20822 Contact with and (suspected) exposure to covid-19: Secondary | ICD-10-CM | POA: Diagnosis not present

## 2023-07-22 DIAGNOSIS — R1013 Epigastric pain: Secondary | ICD-10-CM | POA: Diagnosis not present

## 2023-07-22 DIAGNOSIS — E079 Disorder of thyroid, unspecified: Secondary | ICD-10-CM | POA: Diagnosis not present

## 2023-07-22 DIAGNOSIS — R9431 Abnormal electrocardiogram [ECG] [EKG]: Secondary | ICD-10-CM | POA: Diagnosis not present

## 2023-07-22 DIAGNOSIS — R519 Headache, unspecified: Secondary | ICD-10-CM | POA: Diagnosis not present

## 2023-07-22 DIAGNOSIS — K5792 Diverticulitis of intestine, part unspecified, without perforation or abscess without bleeding: Secondary | ICD-10-CM | POA: Diagnosis not present

## 2023-07-23 DIAGNOSIS — Z792 Long term (current) use of antibiotics: Secondary | ICD-10-CM | POA: Diagnosis not present

## 2023-07-23 DIAGNOSIS — E039 Hypothyroidism, unspecified: Secondary | ICD-10-CM | POA: Diagnosis not present

## 2023-07-23 DIAGNOSIS — Z79890 Hormone replacement therapy (postmenopausal): Secondary | ICD-10-CM | POA: Diagnosis not present

## 2023-07-23 DIAGNOSIS — J329 Chronic sinusitis, unspecified: Secondary | ICD-10-CM | POA: Diagnosis not present

## 2023-07-23 DIAGNOSIS — K5792 Diverticulitis of intestine, part unspecified, without perforation or abscess without bleeding: Secondary | ICD-10-CM | POA: Diagnosis not present

## 2023-07-23 DIAGNOSIS — E860 Dehydration: Secondary | ICD-10-CM | POA: Diagnosis not present

## 2023-07-23 DIAGNOSIS — Z79899 Other long term (current) drug therapy: Secondary | ICD-10-CM | POA: Diagnosis not present

## 2023-07-23 DIAGNOSIS — R112 Nausea with vomiting, unspecified: Secondary | ICD-10-CM | POA: Diagnosis not present

## 2023-07-23 DIAGNOSIS — K219 Gastro-esophageal reflux disease without esophagitis: Secondary | ICD-10-CM | POA: Diagnosis not present

## 2023-07-23 DIAGNOSIS — E876 Hypokalemia: Secondary | ICD-10-CM | POA: Diagnosis not present

## 2023-07-24 DIAGNOSIS — K5792 Diverticulitis of intestine, part unspecified, without perforation or abscess without bleeding: Secondary | ICD-10-CM | POA: Diagnosis not present

## 2023-08-07 DIAGNOSIS — R109 Unspecified abdominal pain: Secondary | ICD-10-CM | POA: Diagnosis not present

## 2023-08-07 DIAGNOSIS — E538 Deficiency of other specified B group vitamins: Secondary | ICD-10-CM | POA: Diagnosis not present

## 2023-08-07 DIAGNOSIS — R112 Nausea with vomiting, unspecified: Secondary | ICD-10-CM | POA: Diagnosis not present

## 2023-08-07 DIAGNOSIS — G8929 Other chronic pain: Secondary | ICD-10-CM | POA: Diagnosis not present

## 2023-08-07 DIAGNOSIS — Z6823 Body mass index (BMI) 23.0-23.9, adult: Secondary | ICD-10-CM | POA: Diagnosis not present

## 2023-08-07 DIAGNOSIS — R030 Elevated blood-pressure reading, without diagnosis of hypertension: Secondary | ICD-10-CM | POA: Diagnosis not present

## 2023-08-08 ENCOUNTER — Other Ambulatory Visit (HOSPITAL_COMMUNITY)
Admission: RE | Admit: 2023-08-08 | Discharge: 2023-08-08 | Disposition: A | Discharge status: Home / Self Care | Payer: Medicare Other | Source: Ambulatory Visit | Attending: Gastroenterology | Admitting: Gastroenterology

## 2023-08-08 ENCOUNTER — Ambulatory Visit (INDEPENDENT_AMBULATORY_CARE_PROVIDER_SITE_OTHER): Payer: Medicare Other | Admitting: Gastroenterology

## 2023-08-08 ENCOUNTER — Ambulatory Visit (HOSPITAL_COMMUNITY)
Admission: RE | Admit: 2023-08-08 | Discharge: 2023-08-08 | Disposition: A | Discharge status: Home / Self Care | Payer: Medicare Other | Source: Ambulatory Visit | Attending: Gastroenterology | Admitting: Gastroenterology

## 2023-08-08 ENCOUNTER — Encounter: Payer: Self-pay | Admitting: Gastroenterology

## 2023-08-08 VITALS — BP 134/80 | HR 106 | Temp 98.1°F | Ht 63.0 in | Wt 138.3 lb

## 2023-08-08 DIAGNOSIS — K5900 Constipation, unspecified: Secondary | ICD-10-CM | POA: Insufficient documentation

## 2023-08-08 DIAGNOSIS — K219 Gastro-esophageal reflux disease without esophagitis: Secondary | ICD-10-CM

## 2023-08-08 DIAGNOSIS — R5383 Other fatigue: Secondary | ICD-10-CM

## 2023-08-08 DIAGNOSIS — K5732 Diverticulitis of large intestine without perforation or abscess without bleeding: Secondary | ICD-10-CM

## 2023-08-08 DIAGNOSIS — R112 Nausea with vomiting, unspecified: Secondary | ICD-10-CM | POA: Insufficient documentation

## 2023-08-08 DIAGNOSIS — E538 Deficiency of other specified B group vitamins: Secondary | ICD-10-CM | POA: Diagnosis not present

## 2023-08-08 DIAGNOSIS — D649 Anemia, unspecified: Secondary | ICD-10-CM | POA: Diagnosis not present

## 2023-08-08 DIAGNOSIS — E611 Iron deficiency: Secondary | ICD-10-CM | POA: Insufficient documentation

## 2023-08-08 DIAGNOSIS — K5792 Diverticulitis of intestine, part unspecified, without perforation or abscess without bleeding: Secondary | ICD-10-CM | POA: Diagnosis not present

## 2023-08-08 LAB — BASIC METABOLIC PANEL
Anion gap: 9 (ref 5–15)
BUN: 18 mg/dL (ref 8–23)
CO2: 26 mmol/L (ref 22–32)
Calcium: 9.3 mg/dL (ref 8.9–10.3)
Chloride: 102 mmol/L (ref 98–111)
Creatinine, Ser: 0.87 mg/dL (ref 0.44–1.00)
GFR, Estimated: >60 mL/min (ref >60)
Glucose, Bld: 92 mg/dL (ref 70–99)
Potassium: 4.1 mmol/L (ref 3.5–5.1)
Sodium: 137 mmol/L (ref 135–145)

## 2023-08-08 LAB — VITAMIN B12: Vitamin B-12: 2834 pg/mL — ABNORMAL HIGH (ref 180–914)

## 2023-08-08 LAB — C-REACTIVE PROTEIN: CRP: <0.5 mg/dL (ref <1.0)

## 2023-08-08 LAB — IRON AND TIBC
Iron: 142 ug/dL (ref 28–170)
Saturation Ratios: 38 % — ABNORMAL HIGH (ref 10.4–31.8)
TIBC: 379 ug/dL (ref 250–450)
UIBC: 237 ug/dL

## 2023-08-08 LAB — FOLATE: Folate: 6.8 ng/mL (ref >5.9)

## 2023-08-08 LAB — FERRITIN: Ferritin: 175 ng/mL (ref 11–307)

## 2023-08-08 NOTE — Patient Instructions (Addendum)
Please have blood work completed at [facility].  We will call you with results once they have been received. Please allow 3-5 business days for review. 2 locations for [facility] in [place]:              1. [address], [place]              2. [address] [place]  If you would like you can have your labs done at the hospital.  I have ordered an abdominal x-ray to assess for worsening constipation.  Continue taking Colace twice daily and MiraLAX once daily.  Continue to wait for Dr. Mayford Knife office to get a prior authorization for Amitiza.  Try slowly advancing her diet with increasing more protein and keeping it low fiber for now.  After 2 weeks you can start to increase more fiber in your diet.  Continue Zofran as needed and your Nexium twice daily.  If you are continuing to have worsening nausea over the next several days or not able to tolerate diet please let me know and I will send in a course of Carafate for you to take for 1-2 weeks to try to soothe your upper GI tract.   Given your recent diverticulitis we should plan for a colonoscopy in the near future.   Try to increase your hydration.  I would recommend some Gatorade or other hydration supplement at least once daily given your low potassium which is likely due to your decreased intake of foods.  It was a pleasure to see you today. I want to create trusting relationships with patients. If you receive a survey regarding your visit,  I greatly appreciate you taking time to fill this out on paper or through your MyChart. I value your feedback.  Brooke Bonito, MSN, FNP-BC, AGACNP-BC [facility]

## 2023-08-08 NOTE — Progress Notes (Unsigned)
GI Office Note    Referring Provider: Richardean Chimera, MD Primary Care Physician:  Richardean Chimera, MD Primary Gastroenterologist: Gerrit Friends.Rourk, MD  Date:  08/08/2023  ID:  Brittany Shields, DOB 1951/04/25, MRN [id number]   Chief Complaint   Chief Complaint  Patient presents with   Diverticulitis    Stomach cramps, nausea and vomiting.  Been to the ED a couple of times. No energy, can't keep any thing on her stomach.    History of Present Illness  Brittany Shields is a 72 y.o. female with a history of hypothyroidism, OSA, TMJ, LPR, arthritis, GERD, constipation, grade 2 hemorrhoids s/p banding 2021 with possible post banding bleed presenting today with complaint of abdominal pain, nausea and vomiting.  Colonoscopy May 2021: -Pancolonic diverticulosis -Nonbleeding external and internal hemorrhoids  EGD March 2021: -Normal esophagus -Multiple gastric polyps s/p biopsy -Gastric erosions -Normal duodenum -Pathology revealed fundic gland polyps and negative H. pylori  Last initial follow-up 07/18/2023 with Lewie Loron.  Continuing to have intermittent flares of hemorrhoids with scant bleeding.  Prefers not to take banding again.  Using Anusol cream as needed.  Taking Amitiza 24 mcg twice daily to help with constipation.  Reportedly unable to wean to once a day on her Nexium, has continued twice daily dosing.  Interested in pursuing TIF evaluation.  Advised to continue Amitiza 24 mcg twice daily as well as twice daily dosing of Nexium.  Seen by Dr. Levon Hedger 07/20/2023 for TIF evaluation.  TIF procedure disc with patient and was offered additional workup.  She was advised to read over the TIF pamphlet further and to call back if she would like to schedule an EGD with [facility] testing while off PPI for 10 days and complete esophagram for workup.  ED visit [facility] on 07/22/2023 with nausea and vomiting and then was discharged home and returned back on 9/8.  Nausea had resolved by morning but was  still experiencing abdominal pain.  Diet slowly advanced throughout the course of the day.  Failed prescription Augmentin outpatient and was instructed to take Zofran as needed for nausea. CT A/P.  Revealed mild diverticulitis involving the descending colon without evidence of perforation or abscess.  Tiny nonobstructing left renal calculus identified.  Per review of her after visit summary she was treated with Augmentin and was given Zofran.  Today: Has had some sickness every since the evening of 9/5. Has seen her family doctor twice and has ED visit and on overnight stay.  Denies history of diverticulitis. Never had pain in the lower abdomen but was always having upper abdominal pain and nausea/vomiting. She states with her reflux she was not having burning but usually just vomits.  Usually goes everyday but sometimes may go a couple days without a BM but also lately hasn't eaten much either. Had some constipation during her hospital stay as well. Nausea was terrible yesterday and prior to that. Has zofran to use as needed and has done well with it today.   Ate better today than she has and has bene able to keep it down. Has some antiemetics that she has on hand as needed. Has been on a BRAT diet.   She states she has hot flashes but that is baseline for her. No recent fevers that she is aware of. She reports about an 8-10 lb weight loss. Dr. Mayford Knife said he could take some colace if needed.    Takes iron daily for about 6 months - has blood work every 4-6  months.   Takes Nexium 40 mg BID. On prednisone 5 mg daily for 20 years for her sinuses.   Current Outpatient Medications  Medication Sig Dispense Refill   acetaminophen (TYLENOL) 500 MG tablet Take 1,000 mg by mouth as needed for moderate pain or headache.     Calcium Carb-Cholecalciferol (CALCIUM 600+D3 PO) Take 2 tablets by mouth every morning.      Cyanocobalamin (B-12 COMPLIANCE INJECTION) 1000 MCG/ML KIT Inject 1,000 mcg as directed  every 30 (thirty) days.     docusate sodium (COLACE) 100 MG capsule Take 200 mg by mouth at bedtime.     esomeprazole (NEXIUM) 40 MG capsule Take 40 mg by mouth 2 (two) times daily.     ferrous sulfate 324 MG TBEC Take 324 mg by mouth daily.     Fezolinetant (VEOZAH) 45 MG TABS Take by mouth.     fluticasone (FLONASE) 50 MCG/ACT nasal spray Place 1 spray into both nostrils 2 (two) times daily.     hydrocortisone (ANUSOL-HC) 2.5 % rectal cream Place 1 Application rectally 2 (two) times daily as needed for hemorrhoids or anal itching. 90 g 3   Hypertonic Nasal Wash (SINUS RINSE NA) Place 1 each into the nose 2 (two) times daily.     levothyroxine (SYNTHROID, LEVOTHROID) 50 MCG tablet Take 50 mcg by mouth daily before breakfast.     ondansetron (ZOFRAN-ODT) 4 MG disintegrating tablet Take 4 mg by mouth every 8 (eight) hours as needed.     predniSONE (DELTASONE) 5 MG tablet Take 5 mg by mouth daily with breakfast.     pregabalin (LYRICA) 100 MG capsule Take 100 mg by mouth daily.     Probiotic Product (PROBIOTIC DAILY) CAPS Take 1 capsule by mouth daily.     promethazine (PHENERGAN) 12.5 MG suppository SMARTSIG:1 SUPPOS Rectally Every 4-6 Hours PRN     triamcinolone cream (KENALOG) 0.1 % Apply 1 Application topically as needed.     lubiprostone (AMITIZA) 24 MCG capsule Take 24 mcg by mouth 2 (two) times daily with a meal. (Patient not taking: Reported on 08/08/2023)     No current facility-administered medications for this visit.    Past Medical History:  Diagnosis Date   Arthritis    Chronic sinusitis    Dry eye    Generalized headaches    GERD (gastroesophageal reflux disease)    Hypothyroidism    Laryngopharyngeal reflux (LPR)    Meibomian gland disease    Murmur    OSA (obstructive sleep apnea)    Retinal vein occlusion    Sensorineural hearing loss    TMJ arthralgia     Past Surgical History:  Procedure Laterality Date   ANTERIOR CERVICAL DECOMP/DISCECTOMY FUSION N/A 08/28/2017    Procedure: ANTERIOR CERVICAL DECOMPRESSION AND FUSION CERVICAL FIVE-SIX,CERVICAL SIX-SEVEN;  Surgeon: Julio Sicks, MD;  Location: [facility] OR;  Service: Neurosurgery;  Laterality: N/A;  anterior   BIOPSY  01/24/2020   Procedure: BIOPSY;  Surgeon: Corbin Ade, MD;  Location: [facility];  Service: Endoscopy;;  gastric polyp; gastric mucosa   CARPAL TUNNEL RELEASE Right 2001   CATARACT EXTRACTION W/PHACO Right 08/08/2017   Procedure: CATARACT EXTRACTION PHACO AND INTRAOCULAR LENS PLACEMENT (IOC);  Surgeon: Jethro Bolus, MD;  Location: [facility] ORS;  Service: Ophthalmology;  Laterality: Right;  CDE: 4.18   CATARACT EXTRACTION W/PHACO Left 11/10/2017   Procedure: CATARACT EXTRACTION PHACO AND INTRAOCULAR LENS PLACEMENT (IOC);  Surgeon: Jethro Bolus, MD;  Location: [facility];  Service: Ophthalmology;  Laterality: Left;  CDE: 3.27   CHOLECYSTECTOMY     COLONOSCOPY     COLONOSCOPY N/A 04/03/2020   Procedure: COLONOSCOPY;  Surgeon: Corbin Ade, MD;  Location: [facility];  Service: Endoscopy;  Laterality: N/A;  10:00am   CYST EXCISION     right eye   ESOPHAGOGASTRODUODENOSCOPY N/A 01/24/2020   Procedure: ESOPHAGOGASTRODUODENOSCOPY (EGD);  Surgeon: Corbin Ade, MD;  Location: [facility];  Service: Endoscopy;  Laterality: N/A;  2:00pm   EYE SURGERY     HAMMER TOE SURGERY Bilateral    IR RADIOLOGIST EVAL & MGMT  07/14/2021   NASAL/SINUS ENDOSCOPY     TUBAL LIGATION     tubal reversal  1991    Family History  Problem Relation Age of Onset   Other Mother    Other Father    Colon cancer Neg Hx     Allergies as of 08/08/2023 - Review Complete 08/08/2023  Allergen Reaction Noted   Zithromax [azithromycin] Hives and Rash 08/02/2017   Lifitegrast Itching 05/25/2018    Social History   Socioeconomic History   Marital status: Married    Spouse name: Not on file   Number of children: Not on file   Years of education: Not on file   Highest education level: Not on file  Occupational History    Not on file  Tobacco Use   Smoking status: Never    Passive exposure: Never   Smokeless tobacco: Never  Vaping Use   Vaping status: Never Used  Substance and Sexual Activity   Alcohol use: No   Drug use: No   Sexual activity: Never  Other Topics Concern   Not on file  Social History Narrative   Not on file   Social Determinants of Health   Financial Resource Strain: Low Risk  (07/24/2023)   Received from [facility]   Overall Financial Resource Strain (CARDIA)    Difficulty of Paying Living Expenses: Not hard at all  Food Insecurity: No Food Insecurity (07/24/2023)   Received from [facility]   Hunger Vital Sign    Worried About Running Out of Food in the Last Year: Never true    Ran Out of Food in the Last Year: Never true  Transportation Needs: No Transportation Needs (07/24/2023)   Received from [facility]   PRAPARE - Transportation    Lack of Transportation (Medical): No    Lack of Transportation (Non-Medical): No  Physical Activity: Sufficiently Active (01/25/2023)   Received from [facility], [facility]   Exercise Vital Sign    Days of Exercise per Week: 5 days    Minutes of Exercise per Session: 40 min  Stress: No Stress Concern Present (01/25/2023)   Received from [facility], [facility] - Occupational Stress Questionnaire    Feeling of Stress : Not at all  Social Connections: Not on file     Review of Systems   Gen: Denies fever, chills, anorexia. Denies fatigue, weakness, weight loss.  CV: Denies chest pain, palpitations, syncope, peripheral edema, and claudication. Resp: Denies dyspnea at rest, cough, wheezing, coughing up blood, and pleurisy. GI: See HPI Derm: Denies rash, itching, dry skin Psych: Denies depression, anxiety, memory loss, confusion. No homicidal or suicidal ideation.  Heme: Denies bruising, bleeding, and enlarged lymph nodes.   Physical Exam   BP 134/80 (BP  Location: Right Arm, Patient Position: Sitting, Cuff Size: Normal)   Pulse (!) 106  Temp 98.1 F (36.7 C) (Temporal)   Ht 5\' 3"  (1.6 m)   Wt 138 lb 4.8 oz (62.7 kg)   BMI 24.50 kg/m   General:   Alert and oriented. No distress noted. Pleasant and cooperative.  Head:  Normocephalic and atraumatic. Eyes:  Conjuctiva clear without scleral icterus. Mouth:  Oral mucosa pink and moist. Good dentition. No lesions. Lungs:  Clear to auscultation bilaterally. No wheezes, rales, or rhonchi. No distress.  Heart:  S1, S2 present without murmurs appreciated.  Abdomen:  +BS, soft, non-tender and non-distended. No rebound or guarding. No HSM or masses noted. Rectal: deferred Msk:  Symmetrical without gross deformities. Normal posture. Extremities:  Without edema. Neurologic:  Alert and  oriented x4 Psych:  Alert and cooperative. Normal mood and affect.   Assessment  Brittany Shields is a 72 y.o. female with a history of hypothyroidism, OSA, TMJ, LPR, arthritis, GERD, constipation, grade 2 hemorrhoids s/p banding 2021 with possible post banding bleed presenting today for follow-up given recent diagnosis of diverticulitis with ongoing nausea, vomiting, and mild abdominal pain.  Constipation: Has baseline constipation, sometimes goes a couple days without a BM but states generally she goes every day.  Previously well-controlled with Amitiza 24 mcg twice daily.  Based on review of her paperwork from PCP office yesterday they recommended Colace as well as MiraLAX daily and stated they would send in paperwork for prior authorization for Amitiza.  Suspect that constipation could be contributing somewhat to her nausea and vomiting however is a risk factor for diverticulitis as well.  Will obtain KUB today to discern level of constipation.  GERD, nausea/vomiting: GERD fairly well-controlled with Nexium 40 mg twice daily, has occasional symptoms.  Has had evaluation with Dr. Levon Hedger for consideration of TIF  however has not considered pursuing further workup at this time given she has been dealing with nausea and vomiting.  Nausea and vomiting began the evening of 9/5 and into 9/6, ED visit as outlined above.  She states in the past that with her reflux she never had traditional heartburn symptoms but would only have nausea and vomiting.  Nausea has been somewhat relieved with Zofran and she was given a supply yesterday by PCP and this has helped although she has felt much improved today overall.  Advised to continue Nexium 40 mg twice daily and can consider trial of Carafate for 2 weeks to help sooth her stomach.  Discussed dietary recommendations including slow advancement of her diet given she is improving.  Fatigue: Reports overall feeling very fatigued and weak since all of her symptoms began.  She states she has been very tired.  Suspect that a lot of her fatigue is secondary to decreased p.o. intake as well as frequent nausea and vomiting and recent diverticulitis.  Takes iron daily and gets B12 injections for vitamin B12 deficiency, last received an IM injection yesterday.  Also reports she takes a vitamin D tablet.  Recent hemoglobin normal.  To ensure electrolytes have returned to normal and to reassess vitamin therapies we will obtain BMP, iron panel, B12, vitamin D levels, and CRP to see if ongoing inflammatory response.  Encouraged her to increase her hydration.   Recent diverticulitis: Diagnosed with diverticulitis via CT A/P on 07/22/2023 at [facility].  No evidence of abscess or perforation on imaging.  She was treated with Augmentin.  She had presented to the ED with nausea and vomiting.  Given Zofran to help with nausea.  Denies any prior history of diverticulitis.  No  family history of colon cancer or colon polyps.  Has been following a brat diet and has been able to keep down Jell-O okay.  Advised that we will likely need to repeat a colonoscopy in 6-8 weeks for further evaluation and to reassess  diverticulitis.  Encouraged her that if she had any development of left-sided abdominal pain or began to experience worsening nausea or vomiting and inability to tolerate anything p.o. that she should call immediately that way we could arrange a repeat CT scan.  Discussed slowly advancing her diet.  PLAN   Continue Zofran as needed Slowly advance diet Increase hydration and water intake KUB Nexium 40 mg BID Can trial Carafate 1g QID Continue colace for now. Miralax 17g daily. BMP, Iron panel, Vitamin B12, vitamin D, CRP Plan for colonoscopy in 6 x 8 weeks. Follow up in 4 weeks   Brooke Bonito, MSN, FNP-BC, AGACNP-BC [facility]

## 2023-08-09 ENCOUNTER — Encounter (INDEPENDENT_AMBULATORY_CARE_PROVIDER_SITE_OTHER): Payer: Self-pay | Admitting: Otolaryngology

## 2023-08-09 ENCOUNTER — Ambulatory Visit (INDEPENDENT_AMBULATORY_CARE_PROVIDER_SITE_OTHER): Payer: Medicare Other | Admitting: Otolaryngology

## 2023-08-09 VITALS — BP 137/77 | HR 70 | Ht 63.0 in | Wt 138.0 lb

## 2023-08-09 DIAGNOSIS — G4733 Obstructive sleep apnea (adult) (pediatric): Secondary | ICD-10-CM

## 2023-08-09 DIAGNOSIS — R0981 Nasal congestion: Secondary | ICD-10-CM | POA: Diagnosis not present

## 2023-08-09 DIAGNOSIS — J342 Deviated nasal septum: Secondary | ICD-10-CM | POA: Diagnosis not present

## 2023-08-09 DIAGNOSIS — K219 Gastro-esophageal reflux disease without esophagitis: Secondary | ICD-10-CM

## 2023-08-09 DIAGNOSIS — J320 Chronic maxillary sinusitis: Secondary | ICD-10-CM

## 2023-08-09 DIAGNOSIS — M26623 Arthralgia of bilateral temporomandibular joint: Secondary | ICD-10-CM | POA: Diagnosis not present

## 2023-08-09 NOTE — Progress Notes (Signed)
ENT CONSULT:  Reason for Consult: Moderate sleep apnea CPAP intolerance  HPI: Brittany Shields is an 72 y.o. female with hx ACDF right sided approach done several years ago, hx of recurrent vs chronic right sides sinusitis, s/p 4 sinus surgeries with Dr Betsey Amen ENT at [facility] (last one 2004), hx of OSA diagnosed based on the home sleep test done 08/01/22, seeing Dr Deliah Goody, here for evaluation of CPAP intolerance and to discuss CPAP alternatives.  She started using CPAP and was not able to tolerate due to trouble with pain around the CPAP mask and chronic TMJ issues. She feels pain around the mask when wearing it. She has hx of facial pain and pressure in the setting of recurrent sinus infections on the right side, s/p FESS x 4 times on the right side. She is on nasal saline irrigations and takes daily prednisone to help prevent recurrent sinus infection (started by her ENT at [facility]). She has had no recent sinus infections. Seen by Dr Craige Cotta, Erline Hau, in July ESS was 9/24.  She reports significant discomfort when the mask is in place. Not interested in oral appliance 2/2 chronic TMJ issues, cannot wear night guard all the time, but does wear it when it starts to cause significant ear discomfort. It helps.    Records Reviewed:  Office note by Dr Coralyn Helling 06/06/23  Having issues w/ snoring, pt is using  c-pap  that hurts her nose that she not able to wear though the night.    She had a sleep study in 2013 at [facility].  She then had another home sleep study through her PCP in Summer of 2023.  She was started on CPAP.  She has trouble using CPAP masks.  She has tried multiple types of masks.  She is followed by ENT for chronic sinusitis, and had several sinus procedures.  Wearing CPAP masks causes sinus irritation.  She has a history of TMJ and has an oral appliance.  She has an adjustable bed and sleeps with her upper body elevated.  She feels her sleep is best when she is sleeping in a  recliner.  When she lays on her back in bed she snores and stops breathing.  This doesn't happen as much when she is on her side, but then her hips start hurting.   She goes to sleep at 11 pm.  She falls asleep quickly when she doesn't use CPAP, but struggles to fall asleep when using CPAP.  She wakes up 2 or 3 times to use the bathroom.  She gets out of bed at 7 am.  She feels okay in the morning, but feels better if she doesn't use her CPAP.  She denies morning headache.  She does not use anything to help her fall sleep or stay awake.   She denies sleep walking, or nightmares.  There is no history of restless legs.  She denies sleep hallucinations, sleep paralysis, or cataplexy.   The Epworth score is 9 out of 24.  Obstructive sleep apnea. - discussed how sleep apnea can impact her health - driving precautions reviewed - discussed positional therapy - intolerant of CPAP - asked her to check with her dentist if she might be a candidate for a mandibular advancement device - will get a copy of her home sleep study from the Summer of 2023; explained she would need to have moderate to severe obstructive sleep apnea to be a candidate for further assessment regarding a hypoglossal nerve stimulator  Chronic sinusitis. - followed by Dr. Rosine Door with ENT at [facility] - remains on prednisone, nasal irrigation and nasal gel   Laryngopharyngeal reflux. - remains on nexium   Past Medical History:  Diagnosis Date   Arthritis    Chronic sinusitis    Dry eye    Generalized headaches    GERD (gastroesophageal reflux disease)    Hypothyroidism    Laryngopharyngeal reflux (LPR)    Meibomian gland disease    Murmur    OSA (obstructive sleep apnea)    Retinal vein occlusion    Sensorineural hearing loss    TMJ arthralgia     Past Surgical History:  Procedure Laterality Date   ANTERIOR CERVICAL DECOMP/DISCECTOMY FUSION N/A 08/28/2017   Procedure: ANTERIOR CERVICAL DECOMPRESSION  AND FUSION CERVICAL FIVE-SIX,CERVICAL SIX-SEVEN;  Surgeon: Julio Sicks, MD;  Location: [facility] OR;  Service: Neurosurgery;  Laterality: N/A;  anterior   BIOPSY  01/24/2020   Procedure: BIOPSY;  Surgeon: Corbin Ade, MD;  Location: [facility];  Service: Endoscopy;;  gastric polyp; gastric mucosa   CARPAL TUNNEL RELEASE Right 2001   CATARACT EXTRACTION W/PHACO Right 08/08/2017   Procedure: CATARACT EXTRACTION PHACO AND INTRAOCULAR LENS PLACEMENT (IOC);  Surgeon: Jethro Bolus, MD;  Location: [facility] ORS;  Service: Ophthalmology;  Laterality: Right;  CDE: 4.18   CATARACT EXTRACTION W/PHACO Left 11/10/2017   Procedure: CATARACT EXTRACTION PHACO AND INTRAOCULAR LENS PLACEMENT (IOC);  Surgeon: Jethro Bolus, MD;  Location: [facility] ORS;  Service: Ophthalmology;  Laterality: Left;  CDE: 3.27   CHOLECYSTECTOMY     COLONOSCOPY     COLONOSCOPY N/A 04/03/2020   Procedure: COLONOSCOPY;  Surgeon: Corbin Ade, MD;  Location: [facility];  Service: Endoscopy;  Laterality: N/A;  10:00am   CYST EXCISION     right eye   ESOPHAGOGASTRODUODENOSCOPY N/A 01/24/2020   Procedure: ESOPHAGOGASTRODUODENOSCOPY (EGD);  Surgeon: Corbin Ade, MD;  [facility]: [facility];  Service: Endoscopy;  Laterality: N/A;  2:00pm   EYE SURGERY     HAMMER TOE SURGERY Bilateral    IR RADIOLOGIST EVAL & MGMT  07/14/2021   NASAL/SINUS ENDOSCOPY     TUBAL LIGATION     tubal reversal  1991    Family History  Problem Relation Age of Onset   Other Mother    Other Father    Colon cancer Neg Hx     Social History:  reports that she has never smoked. She has never been exposed to tobacco smoke. She has never used smokeless tobacco. She reports that she does not drink alcohol and does not use drugs.  Allergies:  Allergies  Allergen Reactions   Zithromax [Azithromycin] Hives and Rash   Lifitegrast Itching    Burning pain *Xiidra    Medications: I have reviewed the patient's current medications.  The PMH, PSH, Medications,  Allergies, and SH were reviewed and updated.  ROS: Constitutional: Negative for fever, weight loss and weight gain. Cardiovascular: Negative for chest pain and dyspnea on exertion. Respiratory: Is not experiencing shortness of breath at rest. Gastrointestinal: Negative for nausea and vomiting. Neurological: Negative for headaches. Psychiatric: The patient is not nervous/anxious  Blood pressure 137/77, pulse 70, height 5\' 3"  (1.6 m), weight 138 lb (62.6 kg), SpO2 96%.  PHYSICAL EXAM:  Exam: General: Well-developed, well-nourished Communication and Voice: Clear pitch and clarity Respiratory Respiratory effort: Equal inspiration and expiration without stridor Cardiovascular Peripheral Vascular: Warm extremities with equal color/perfusion Eyes: No nystagmus with equal extraocular motion bilaterally Neuro/Psych/Balance: Patient  oriented to person, place, and time; Appropriate mood and affect; Gait is intact with no imbalance; Cranial nerves I-XII are intact Head and Face Inspection: Normocephalic and atraumatic without mass or lesion Palpation: Facial skeleton intact without bony stepoffs Salivary Glands: No mass or tenderness Facial Strength: Facial motility symmetric and full bilaterally ENT Pinna: External ear intact and fully developed External canal: Canal is patent with intact skin Tympanic Membrane: Clear and mobile External Nose: No scar or anatomic deformity Internal Nose: Septum is deviated to the left. No polyp, or purulence. Mucosal edema and erythema present. Evidence of R sided post-surgical changes with patent ethmoidectomy and maxillary antrostomy, no purulence Bilateral inferior turbinate hypertrophy.  Lips, Teeth, and gums: Mucosa and teeth intact and viable TMJ: No pain to palpation with full mobility Oral cavity/oropharynx: No erythema or exudate, no lesions present Nasopharynx: No mass or lesion with intact mucosa Hypopharynx: Intact mucosa without pooling of  secretions Larynx Glottic: Full true vocal cord mobility without lesion or mass Supraglottic: Normal appearing epiglottis and AE folds Interarytenoid Space: No or minimal pachydermia or edema Subglottic Space: Patent without lesion or edema Neck Neck and Trachea: Midline trachea without mass or lesion Thyroid: No mass or nodularity Lymphatics: No lymphadenopathy  Procedure: Preoperative diagnosis: OSA and CPAP intolerance  Postoperative diagnosis:   Same + GERD/LPR  Procedure: Flexible fiberoptic laryngoscopy  Surgeon: Ashok Croon, MD  Anesthesia: Topical lidocaine and Afrin Complications: None Condition is stable throughout exam  Indications and consent:  The patient presents to the clinic with Indirect laryngoscopy view was incomplete. Thus it was recommended that they undergo a flexible fiberoptic laryngoscopy. All of the risks, benefits, and potential complications were reviewed with the patient preoperatively and verbal informed consent was obtained.  Procedure: The patient was seated upright in the clinic. Topical lidocaine and Afrin were applied to the nasal cavity. After adequate anesthesia had occurred, I then proceeded to pass the flexible telescope into the nasal cavity. The nasal cavity was patent without rhinorrhea or polyp. The nasopharynx was also patent without mass or lesion. The base of tongue was visualized and was normal. There were no signs of pooling of secretions in the piriform sinuses. The true vocal folds were mobile bilaterally. There were no signs of glottic or supraglottic mucosal lesion or mass. There was moderate interarytenoid pachydermia and post cricoid edema. The telescope was then slowly withdrawn and the patient tolerated the procedure throughout.  PROCEDURE NOTE: nasal endoscopy  Preoperative diagnosis: chronic sinusitis history hx of multiple sinus surgeries  Postoperative diagnosis: same  Procedure: Diagnostic nasal endoscopy  (46270)  Surgeon: Ashok Croon, M.D.  Anesthesia: Topical lidocaine and Afrin  H&P REVIEW: The patient's history and physical were reviewed today prior to procedure. All medications were reviewed and updated as well. Complications: None Condition is stable throughout exam Indications and consent: The patient presents with symptoms of chronic sinusitis not responding to previous therapies. All the risks, benefits, and potential complications were reviewed with the patient preoperatively and informed consent was obtained. The time out was completed with confirmation of the correct procedure.   Procedure: The patient was seated upright in the clinic. Topical lidocaine and Afrin were applied to the nasal cavity. After adequate anesthesia had occurred, the rigid nasal endoscope was passed into the nasal cavity. The nasal mucosa, turbinates, septum, and sinus drainage pathways were visualized bilaterally. This revealed no purulence or significant secretions that might be cultured. There were no polyps or sites of significant inflammation. The mucosa was intact and  there was no crusting present.  Right side had evidence of patent maxillary antrostomy and ethmoidectomy. The scope was then slowly withdrawn and the patient tolerated the procedure well. There were no complications or blood loss.  Studies Reviewed - summary of home sleep study   Assessment/Plan: Encounter Diagnoses  Name Primary?   Obstructive sleep apnea Yes   Nasal congestion    Chronic maxillary sinusitis    Bilateral temporomandibular joint pain    Nasal septal deviation    Gastroesophageal reflux disease without esophagitis     Moderate OSA -diagnosed based on home sleep study 08/02/2022, full report not available for review but summary provided in Dr. Evlyn Courier documentation - AHI of 16.5 SpO2 nadir 82%, ESS 9/24.  BMI 24.45. OSA, moderate, without multilevel collapse, with failure to tolerate PAP therapy and/or more  conservative measures due to chronic sinus issues and chronic TMJ pain  Presence of absent tonsils and larger tongue position (Friedman tongue position or modified Mallampati) suggests that hypopharyngeal/retrolingual collapse is contributing to the patient's OSA. Zachery Conch, M et al. Staging of obstructive sleep apnea/hypopnea syndrome: a guide to appropriate treatment. Laryngoscope, 2004 Mar, 114(3):454-9. PMID: [id number]) Options including positional therapy, weight loss, oral appliances, PAP and surgical correction discussed. Pt is not an ideal candidate for oral appliance due to chronic TMJ pain and intolerance of dental guard in the past due to discomfort and poor sleep.  Pt could be a candidate for Hypoglossal nerve stimulation (Inspire therapy) pending review of the HST done 08/01/22 and DISE -Will obtain records from PCPs office to review full report for home sleep study -If needed will refer for repeat in-lab sleep study back to Dr. Craige Cotta -Patient will return to discuss candidacy for inspire therapy in a few weeks  2.  Chronic sinusitis right side status post surgeries with Dr. Betsey Amen at [facility] -Nasal endoscopy today without evidence of recurrent inflammation or purulent drainage on the right side -Follow-up with Dr. Betsey Amen in December as scheduled -Continue nasal saline rinses and prednisone 5 mg daily- -continue Flonase 2 puffs b/l nares twice daily  3.  GERD LPR -I discussed diet and lifestyle changes to minimize reflux -Continue Nexium 40 mg daily  RTC 3 to 4 weeks to discuss candidacy for inspire  Thank you for allowing me to participate in the care of this patient. Please do not hesitate to contact me with any questions or concerns.   Ashok Croon, MD Otolaryngology [facility] Phone: [phone] Fax: [phone]    08/09/2023, 6:53 PM

## 2023-08-09 NOTE — Patient Instructions (Signed)
-   we will get copies of your sleep test  - if needed we will schedule you for repeat sleep test  - we will get in touch

## 2023-08-30 DIAGNOSIS — E7849 Other hyperlipidemia: Secondary | ICD-10-CM | POA: Diagnosis not present

## 2023-08-30 DIAGNOSIS — K219 Gastro-esophageal reflux disease without esophagitis: Secondary | ICD-10-CM | POA: Diagnosis not present

## 2023-08-30 DIAGNOSIS — D649 Anemia, unspecified: Secondary | ICD-10-CM | POA: Diagnosis not present

## 2023-08-30 DIAGNOSIS — N183 Chronic kidney disease, stage 3 unspecified: Secondary | ICD-10-CM | POA: Diagnosis not present

## 2023-08-30 DIAGNOSIS — Z1321 Encounter for screening for nutritional disorder: Secondary | ICD-10-CM | POA: Diagnosis not present

## 2023-08-30 DIAGNOSIS — Z0000 Encounter for general adult medical examination without abnormal findings: Secondary | ICD-10-CM | POA: Diagnosis not present

## 2023-08-30 DIAGNOSIS — E039 Hypothyroidism, unspecified: Secondary | ICD-10-CM | POA: Diagnosis not present

## 2023-09-04 ENCOUNTER — Telehealth: Payer: Self-pay

## 2023-09-04 NOTE — Telephone Encounter (Signed)
Phoned and spoke with the pt she is having a daily BM. She is taking Miralax once a day,everyday. The nausea is consistent everyday, all day. She thought it may because she doesn't eat a lot but she tries to eat more and she still have nausea. She wants procedures scheduled to see what is going on before her appt with Tobi Bastos in November

## 2023-09-04 NOTE — Telephone Encounter (Signed)
Pt phoned and wanted to speak with Tobi Bastos (but you seen her last month). Pt states she has been having nausea and vomiting for over one month. She states the vomiting is better but her nausea is still there and with the holidays coming up she wants to feel better. Pt inquires about endoscopy and or colonoscopy. Please advise

## 2023-09-05 ENCOUNTER — Encounter: Payer: Self-pay | Admitting: *Deleted

## 2023-09-05 ENCOUNTER — Other Ambulatory Visit: Payer: Self-pay | Admitting: *Deleted

## 2023-09-05 NOTE — Telephone Encounter (Signed)
Updated instructions to start prep earlier. Instructions printed off regarding medication for nausea and placed with instructions

## 2023-09-05 NOTE — Progress Notes (Signed)
error 

## 2023-09-05 NOTE — Telephone Encounter (Signed)
Pt has been scheduled for 09/14/23. Instructions placed up from with sample of clenpiq. Pt to come by tomorrow to pick up. Pt is also concerned about drinking the prep because having the nausea. She is afraid it may not stay down. Please advise. Thank you

## 2023-09-06 ENCOUNTER — Telehealth: Payer: Self-pay | Admitting: *Deleted

## 2023-09-06 DIAGNOSIS — K2100 Gastro-esophageal reflux disease with esophagitis, without bleeding: Secondary | ICD-10-CM | POA: Diagnosis not present

## 2023-09-06 DIAGNOSIS — R011 Cardiac murmur, unspecified: Secondary | ICD-10-CM | POA: Diagnosis not present

## 2023-09-06 DIAGNOSIS — E538 Deficiency of other specified B group vitamins: Secondary | ICD-10-CM | POA: Diagnosis not present

## 2023-09-06 DIAGNOSIS — J301 Allergic rhinitis due to pollen: Secondary | ICD-10-CM | POA: Diagnosis not present

## 2023-09-06 DIAGNOSIS — M3130 Wegener's granulomatosis without renal involvement: Secondary | ICD-10-CM | POA: Diagnosis not present

## 2023-09-06 DIAGNOSIS — Z0001 Encounter for general adult medical examination with abnormal findings: Secondary | ICD-10-CM | POA: Diagnosis not present

## 2023-09-06 DIAGNOSIS — G43909 Migraine, unspecified, not intractable, without status migrainosus: Secondary | ICD-10-CM | POA: Diagnosis not present

## 2023-09-06 DIAGNOSIS — Z1212 Encounter for screening for malignant neoplasm of rectum: Secondary | ICD-10-CM | POA: Diagnosis not present

## 2023-09-06 DIAGNOSIS — R030 Elevated blood-pressure reading, without diagnosis of hypertension: Secondary | ICD-10-CM | POA: Diagnosis not present

## 2023-09-06 DIAGNOSIS — F331 Major depressive disorder, recurrent, moderate: Secondary | ICD-10-CM | POA: Diagnosis not present

## 2023-09-06 DIAGNOSIS — M5030 Other cervical disc degeneration, unspecified cervical region: Secondary | ICD-10-CM | POA: Diagnosis not present

## 2023-09-06 DIAGNOSIS — R300 Dysuria: Secondary | ICD-10-CM | POA: Diagnosis not present

## 2023-09-06 DIAGNOSIS — E039 Hypothyroidism, unspecified: Secondary | ICD-10-CM | POA: Diagnosis not present

## 2023-09-06 DIAGNOSIS — Z23 Encounter for immunization: Secondary | ICD-10-CM | POA: Diagnosis not present

## 2023-09-06 DIAGNOSIS — I350 Nonrheumatic aortic (valve) stenosis: Secondary | ICD-10-CM | POA: Diagnosis not present

## 2023-09-06 DIAGNOSIS — M797 Fibromyalgia: Secondary | ICD-10-CM | POA: Diagnosis not present

## 2023-09-06 DIAGNOSIS — E782 Mixed hyperlipidemia: Secondary | ICD-10-CM | POA: Diagnosis not present

## 2023-09-06 DIAGNOSIS — E7849 Other hyperlipidemia: Secondary | ICD-10-CM | POA: Diagnosis not present

## 2023-09-06 NOTE — Telephone Encounter (Signed)
Pt called and states she was prescribed Macrobid for a UTI today and would like to know if it is ok to take. She has a colonoscopy scheduled for next week and wants to make sure it will be fine before procedure.

## 2023-09-07 NOTE — Telephone Encounter (Signed)
Spoke to pt, informed her of recommendations. Pt voiced understanding.  

## 2023-09-13 ENCOUNTER — Ambulatory Visit (INDEPENDENT_AMBULATORY_CARE_PROVIDER_SITE_OTHER): Payer: Medicare Other | Admitting: Otolaryngology

## 2023-09-14 ENCOUNTER — Ambulatory Visit (HOSPITAL_COMMUNITY)
Admission: RE | Admit: 2023-09-14 | Discharge: 2023-09-14 | Disposition: A | Discharge status: Home / Self Care | Payer: Medicare Other | Attending: Internal Medicine | Admitting: Internal Medicine

## 2023-09-14 ENCOUNTER — Ambulatory Visit (HOSPITAL_COMMUNITY): Payer: Medicare Other | Admitting: Anesthesiology

## 2023-09-14 ENCOUNTER — Other Ambulatory Visit: Payer: Self-pay

## 2023-09-14 ENCOUNTER — Encounter (HOSPITAL_COMMUNITY): Payer: Self-pay | Admitting: Internal Medicine

## 2023-09-14 ENCOUNTER — Ambulatory Visit (HOSPITAL_BASED_OUTPATIENT_CLINIC_OR_DEPARTMENT_OTHER): Payer: Medicare Other | Admitting: Anesthesiology

## 2023-09-14 ENCOUNTER — Encounter (HOSPITAL_COMMUNITY)
Admission: RE | Disposition: A | Discharge status: Home / Self Care | Payer: Self-pay | Source: Home / Self Care | Attending: Internal Medicine

## 2023-09-14 DIAGNOSIS — K5909 Other constipation: Secondary | ICD-10-CM | POA: Insufficient documentation

## 2023-09-14 DIAGNOSIS — K3189 Other diseases of stomach and duodenum: Secondary | ICD-10-CM | POA: Diagnosis not present

## 2023-09-14 DIAGNOSIS — G4733 Obstructive sleep apnea (adult) (pediatric): Secondary | ICD-10-CM | POA: Diagnosis not present

## 2023-09-14 DIAGNOSIS — M1990 Unspecified osteoarthritis, unspecified site: Secondary | ICD-10-CM | POA: Diagnosis not present

## 2023-09-14 DIAGNOSIS — Z79899 Other long term (current) drug therapy: Secondary | ICD-10-CM | POA: Insufficient documentation

## 2023-09-14 DIAGNOSIS — R112 Nausea with vomiting, unspecified: Secondary | ICD-10-CM | POA: Diagnosis not present

## 2023-09-14 DIAGNOSIS — K649 Unspecified hemorrhoids: Secondary | ICD-10-CM | POA: Diagnosis not present

## 2023-09-14 DIAGNOSIS — R933 Abnormal findings on diagnostic imaging of other parts of digestive tract: Secondary | ICD-10-CM | POA: Diagnosis not present

## 2023-09-14 DIAGNOSIS — G4730 Sleep apnea, unspecified: Secondary | ICD-10-CM | POA: Diagnosis not present

## 2023-09-14 DIAGNOSIS — K5730 Diverticulosis of large intestine without perforation or abscess without bleeding: Secondary | ICD-10-CM | POA: Insufficient documentation

## 2023-09-14 DIAGNOSIS — E039 Hypothyroidism, unspecified: Secondary | ICD-10-CM | POA: Insufficient documentation

## 2023-09-14 DIAGNOSIS — K219 Gastro-esophageal reflux disease without esophagitis: Secondary | ICD-10-CM | POA: Diagnosis not present

## 2023-09-14 HISTORY — PX: ESOPHAGOGASTRODUODENOSCOPY (EGD) WITH PROPOFOL: SHX5813

## 2023-09-14 HISTORY — PX: COLONOSCOPY WITH PROPOFOL: SHX5780

## 2023-09-14 SURGERY — COLONOSCOPY WITH PROPOFOL
Anesthesia: General

## 2023-09-14 MED ORDER — LIDOCAINE HCL (CARDIAC) PF 100 MG/5ML IV SOSY
PREFILLED_SYRINGE | INTRAVENOUS | Status: DC | PRN
  Administered 2023-09-14: 60 mg via INTRAVENOUS

## 2023-09-14 MED ORDER — PROPOFOL 500 MG/50ML IV EMUL
INTRAVENOUS | Status: DC | PRN
  Administered 2023-09-14: 75 ug/kg/min via INTRAVENOUS

## 2023-09-14 MED ORDER — LACTATED RINGERS IV SOLN: INTRAVENOUS | Status: DC | PRN

## 2023-09-14 MED ORDER — PROPOFOL 10 MG/ML IV BOLUS
INTRAVENOUS | Status: DC | PRN
  Administered 2023-09-14: 80 mg via INTRAVENOUS
  Administered 2023-09-14: 110 mg via INTRAVENOUS
  Administered 2023-09-14: 90 mg via INTRAVENOUS

## 2023-09-14 NOTE — H&P (Signed)
@LOGO @   Primary Care Physician:  Richardean Chimera, MD Primary Gastroenterologist:  Dr. Jena Gauss  Pre-Procedure History & Physical: HPI:  Brittany Shields is a 72 y.o. female here for here for further evaluation recurrent nausea and vomiting takes Amitiza at inappropriate times.  Nexium also at inappropriate times.  Reflux fairly well-controlled.  No dysphagia.  Chronically constipated Amitiza 24 twice daily MiraLAX up to 3 times daily stool softener daily.  Recent abnormal colon on CT concerning for diverticulitis.  Past Medical History:  Diagnosis Date   Arthritis    Chronic sinusitis    Dry eye    Generalized headaches    GERD (gastroesophageal reflux disease)    Hypothyroidism    Laryngopharyngeal reflux (LPR)    Meibomian gland disease    Murmur    OSA (obstructive sleep apnea)    Retinal vein occlusion    Sensorineural hearing loss    TMJ arthralgia     Past Surgical History:  Procedure Laterality Date   ANTERIOR CERVICAL DECOMP/DISCECTOMY FUSION N/A 08/28/2017   Procedure: ANTERIOR CERVICAL DECOMPRESSION AND FUSION CERVICAL FIVE-SIX,CERVICAL SIX-SEVEN;  Surgeon: Julio Sicks, MD;  Location: [facility] OR;  Service: Neurosurgery;  Laterality: N/A;  anterior   BIOPSY  01/24/2020   Procedure: BIOPSY;  Surgeon: Corbin Ade, MD;  Location: [facility];  Service: Endoscopy;;  gastric polyp; gastric mucosa   CARPAL TUNNEL RELEASE Right 2001   CATARACT EXTRACTION W/PHACO Right 08/08/2017   Procedure: CATARACT EXTRACTION PHACO AND INTRAOCULAR LENS PLACEMENT (IOC);  Surgeon: Jethro Bolus, MD;  Location: [facility] ORS;  Service: Ophthalmology;  Laterality: Right;  CDE: 4.18   CATARACT EXTRACTION W/PHACO Left 11/10/2017   Procedure: CATARACT EXTRACTION PHACO AND INTRAOCULAR LENS PLACEMENT (IOC);  Surgeon: Jethro Bolus, MD;  Location: [facility] OR[facility];  Service: Ophthalmology;  Laterality: Left;  CDE: 3.27   CHOLECYSTECTOMY     COLONOSCOPY     COLONOSCOPY N/A 04/03/2020   Procedure: COLONOSCOPY;  Surgeon:  Corbin Ade, MD;  [facility]: [facility];  Service: Endoscopy;  Laterality: N/A;  10:00am   CYST EXCISION     right eye   ESOPHAGOGASTRODUODENOSCOPY N/A 01/24/2020   Procedure: ESOPHAGOGASTRODUODENOSCOPY (EGD);  Surgeon: Corbin Ade, MD;  [facility]: [facility];  Service: Endoscopy;  Laterality: N/A;  2:00pm   EYE SURGERY     HAMMER TOE SURGERY Bilateral    IR RADIOLOGIST EVAL & MGMT  07/14/2021   NASAL/SINUS ENDOSCOPY     TUBAL LIGATION     tubal reversal  1991    Prior to Admission medications   Medication Sig Start Date End Date Taking? Authorizing Provider  acetaminophen (TYLENOL) 500 MG tablet Take 1,000 mg by mouth as needed for moderate pain or headache.   Yes [provider]  Calcium Carb-Cholecalciferol (CALCIUM 600+D3 PO) Take 2 tablets by mouth every morning.    Yes [provider]  Cyanocobalamin (B-12 COMPLIANCE INJECTION) 1000 MCG/ML KIT Inject 1,000 mcg as directed every 30 (thirty) days.   Yes [provider]  docusate sodium (COLACE) 100 MG capsule Take 200 mg by mouth at bedtime.   Yes [provider]  esomeprazole (NEXIUM) 40 MG capsule Take 40 mg by mouth 2 (two) times daily. 06/28/22  Yes [provider]  Fezolinetant (VEOZAH) 45 MG TABS Take by mouth.   Yes [provider]  fluticasone (FLONASE) 50 MCG/ACT nasal spray Place 1 spray into both nostrils 2 (two) times daily. 07/25/22  Yes [provider]  hydrocortisone (ANUSOL-HC) 2.5 %  rectal cream Place 1 Application rectally 2 (two) times daily as needed for hemorrhoids or anal itching. 07/18/23  Yes Gelene Mink, NP  Hypertonic Nasal Wash (SINUS RINSE NA) Place 1 each into the nose 2 (two) times daily.   Yes [provider]  levothyroxine (SYNTHROID, LEVOTHROID) 50 MCG tablet Take 50 mcg by mouth daily before breakfast.   Yes [provider]  ondansetron (ZOFRAN-ODT) 4 MG disintegrating tablet Take 4 mg by mouth every 8 (eight) hours  as needed.   Yes [provider]  predniSONE (DELTASONE) 5 MG tablet Take 5 mg by mouth daily with breakfast.   Yes [provider]  pregabalin (LYRICA) 100 MG capsule Take 100 mg by mouth daily. 12/27/21  Yes [provider]  Probiotic Product (PROBIOTIC DAILY) CAPS Take 1 capsule by mouth daily.   Yes [provider]  promethazine (PHENERGAN) 12.5 MG suppository SMARTSIG:1 SUPPOS Rectally Every 4-6 Hours PRN 07/27/23  Yes [provider]  triamcinolone cream (KENALOG) 0.1 % Apply 1 Application topically as needed.   Yes [provider]  ferrous sulfate 324 MG TBEC Take 324 mg by mouth daily.    [provider]  lubiprostone (AMITIZA) 24 MCG capsule Take 24 mcg by mouth 2 (two) times daily with a meal. Patient not taking: Reported on 08/08/2023 01/13/23   [provider]    Allergies as of 09/05/2023 - Review Complete 08/09/2023  Allergen Reaction Noted   Zithromax [azithromycin] Hives and Rash 08/02/2017   Lifitegrast Itching 05/25/2018    Family History  Problem Relation Age of Onset   Other Mother    Other Father    Colon cancer Neg Hx     Social History   Socioeconomic History   Marital status: Married    Spouse name: Not on file   Number of children: Not on file   Years of education: Not on file   Highest education level: Not on file  Occupational History   Not on file  Tobacco Use   Smoking status: Never    Passive exposure: Never   Smokeless tobacco: Never  Vaping Use   Vaping status: Never Used  Substance and Sexual Activity   Alcohol use: No   Drug use: No   Sexual activity: Never  Other Topics Concern   Not on file  Social History Narrative   Not on file   Social Determinants of Health   Financial Resource Strain: Low Risk  (07/24/2023)   Received from [facility]   Overall Financial Resource Strain (CARDIA)    Difficulty of Paying Living Expenses: Not hard at all  Food Insecurity:  No Food Insecurity (07/24/2023)   Received from [facility]   Hunger Vital Sign    Worried About Running Out of Food in the Last Year: Never true    Ran Out of Food in the Last Year: Never true  Transportation Needs: No Transportation Needs (07/24/2023)   Received from [facility]   PRAPARE - Transportation    Lack of Transportation (Medical): No    Lack of Transportation (Non-Medical): No  Physical Activity: Sufficiently Active (01/25/2023)   Received from [facility]   Exercise Vital Sign    Days of Exercise per Week: 5 days    Minutes of Exercise per Session: 40 min  Stress: No Stress Concern Present (01/25/2023)   Received from [facility] [employer] [facility]  Occupational Stress Questionnaire    Feeling of Stress : Not at all  Social Connections: Not on file  Intimate Partner Violence: Not At Risk (01/25/2023)   Received from [facility]   Humiliation, Afraid, Rape, and Kick questionnaire    Fear of Current or Ex-Partner: No    Emotionally Abused: No    Physically Abused: No    Sexually Abused: No    Review of Systems: See HPI, otherwise negative ROS  Physical Exam: BP (!) 144/76   Pulse 82   Temp 97.9 F (36.6 C) (Oral)   Resp (!) 23   Ht 5\' 3"  (1.6 m)   Wt 60.3 kg   SpO2 97%   BMI 23.56 kg/m  General:   Alert,  Well-developed, well-nourished, pleasant and cooperative in NAD athy. Lungs:  Clear throughout to auscultation.   No wheezes, crackles, or rhonchi. No acute distress. Heart:  Regular rate and rhythm; no murmurs, clicks, rubs,  or gallops. Abdomen: Non-distended, normal bowel sounds.  Soft and nontender without appreciable mass or hepatosplenomegaly.  Pulses:  Normal pulses noted. Extremities:  Without clubbing or edema.  Impression/Plan: Recurrent refractory nausea and vomiting.  On Amitiza.  Does not take emesis Tesa during meals.  No dysphagia.  Does not take  Nexium twice daily before meals.  Refractory/difficult to treat constipation.  Was doing well on Linzess but switched to Amitiza Amitiza may be playing a role in her nausea.  May get better efficacy with PPI taking it before meals as well.  Abnormal colon on CT.  Recommendations: Diagnostic EGD today followed by colonoscopy.  Medication but may be needed.  May need to come off Amitiza entirely as it may be contributing to nausea.  Did well on Linzess but insurance would not pay for it.  The risks, benefits, limitations, imponderables and alternatives regarding both EGD and colonoscopy have been reviewed with the patient. Questions have been answered. All parties agreeable.        Notice: This dictation was prepared with Dragon dictation along with smaller phrase technology. Any transcriptional errors that result from this process are unintentional and may not be corrected upon review.

## 2023-09-14 NOTE — Op Note (Signed)
[facility] Patient Name: Brittany Shields Procedure Date: 09/14/2023 8:54 AM MRN: [id number] Date of Birth: 1951/09/01 Attending MD: Gennette Pac , MD, [id number] CSN: [id number] Age: 72 Admit Type: Outpatient Procedure:                Upper GI endoscopy Indications:              Nausea with vomiting Providers:                Gennette Pac, MD, Sheran Fava, Francoise Ceo RN, RN, Pandora Leiter, Technician Referring MD:              Medicines:                Propofol per Anesthesia Complications:            No immediate complications. Estimated Blood Loss:     Estimated blood loss: none. Procedure:                Pre-Anesthesia Assessment:                           - Prior to the procedure, a History and Physical                            was performed, and patient medications and                            allergies were reviewed. The patient's tolerance of                            previous anesthesia was also reviewed. The risks                            and benefits of the procedure and the sedation                            options and risks were discussed with the patient.                            All questions were answered, and informed consent                            was obtained. Prior Anticoagulants: The patient has                            taken no anticoagulant or antiplatelet agents. ASA                            Grade Assessment: III - A patient with severe                            systemic disease. After reviewing the risks and  benefits, the patient was deemed in satisfactory                            condition to undergo the procedure.                           After obtaining informed consent, the endoscope was                            passed under direct vision. Throughout the                            procedure, the patient's blood pressure, pulse, and                             oxygen saturations were monitored continuously. The                            [id number]-H190 ([id number]) scope was introduced through the                            mouth, and advanced to the second part of duodenum.                            The upper GI endoscopy was accomplished without                            difficulty. The patient tolerated the procedure                            well. Scope In: 9:27:19 AM Scope Out: 9:31:19 AM Total Procedure Duration: 0 hours 4 minutes 0 seconds  Findings:      The examined esophagus was normal. Gastric cavity empty.      Minimally polypoid gastric mucosa (previously biopsied). No ulcer or       infiltrating process. Pylorus patent.      The duodenal bulb and second portion of the duodenum were normal. Impression:               - Normal esophagus.                           -Minimally polypoid gastric mucosa?"previously                            biopsied.                           - Normal duodenal bulb and second portion of the                            duodenum.                           -Today's findings would not explain patient's  symptoms of nausea and vomiting which may be                            medication related (Amitiza). Patient has been                            taking Amitiza and Nexium incorrectly. Moderate Sedation:      Moderate (conscious) sedation was personally administered by an       anesthesia professional. The following parameters were monitored: oxygen       saturation, heart rate, blood pressure, respiratory rate, EKG, adequacy       of pulmonary ventilation, and response to care. Recommendation:           - Patient has a contact number available for                            emergencies. The signs and symptoms of potential                            delayed complications were discussed with the                            patient. Return to normal activities tomorrow.                             Written discharge instructions were provided to the                            patient.                           - Advance diet as tolerated.                           - Continue present medications. Continue Nexium 40                            mg daily please take medication 30 minutes before                            breakfast and supper not at bedtime. Take Amitiza                            24 DURING breakfast and supper. If no improvement                            in nausea after that maneuver, consider                            discontinuing Amitiza                           - Return to my office as previously scheduled. See  colonoscopy report Procedure Code(s):        --- Professional ---                           [phone], Esophagogastroduodenoscopy, flexible,                            transoral; diagnostic, including collection of                            specimen(s) by brushing or washing, when performed                            (separate procedure) Diagnosis Code(s):        --- Professional ---                           R11.2, Nausea with vomiting, unspecified CPT copyright 2022 American Medical Association. All rights reserved. The codes documented in this report are preliminary and upon coder review may  be revised to meet current compliance requirements. Gerrit Friends. Sharay Bellissimo, MD Gennette Pac, MD 09/14/2023 9:52:25 AM This report has been signed electronically. Number of Addenda: 0

## 2023-09-14 NOTE — Anesthesia Procedure Notes (Signed)
Date/Time: 09/14/2023 9:18 AM  Performed by: Franco Nones, CRNAPre-anesthesia Checklist: Patient identified, Emergency Drugs available, Suction available, Timeout performed and Patient being monitored Patient Re-evaluated:Patient Re-evaluated prior to induction Oxygen Delivery Method: Nasal cannula Comments: Optiflow

## 2023-09-14 NOTE — Op Note (Signed)
[facility] Patient Name: Brittany Shields Procedure Date: 09/14/2023 8:53 AM MRN: [id number] Date of Birth: July 01, 1951 Attending MD: Gennette Pac , MD, [id number] CSN: [id number] Age: 72 Admit Type: Outpatient Procedure:                Colonoscopy Indications:              Abnormal CT of the GI tract Providers:                Gennette Pac, MD, Sheran Fava, Francoise Ceo RN, RN, Pandora Leiter, Technician Referring MD:              Medicines:                Propofol per Anesthesia Complications:            No immediate complications. Estimated Blood Loss:     Estimated blood loss: none. Procedure:                Pre-Anesthesia Assessment:                           - Prior to the procedure, a History and Physical                            was performed, and patient medications and                            allergies were reviewed. The patient's tolerance of                            previous anesthesia was also reviewed. The risks                            and benefits of the procedure and the sedation                            options and risks were discussed with the patient.                            All questions were answered, and informed consent                            was obtained. Prior Anticoagulants: The patient has                            taken no anticoagulant or antiplatelet agents. ASA                            Grade Assessment: II - A patient with mild systemic                            disease. After reviewing the risks and benefits,  the patient was deemed in satisfactory condition to                            undergo the procedure.                           After obtaining informed consent, the colonoscope                            was passed under direct vision. Throughout the                            procedure, the patient's blood pressure, pulse, and                             oxygen saturations were monitored continuously. The                            [id number]) scope was introduced through the                            anus and advanced to the the cecum, identified by                            appendiceal orifice and ileocecal valve. The                            colonoscopy was performed without difficulty. The                            patient tolerated the procedure well. The quality                            of the bowel preparation was adequate. The                            colonoscopy was performed without difficulty. The                            colonoscopy was performed without difficulty. Scope In: 9:43:08 AM Scope Out: 9:51:44 AM Scope Withdrawal Time: 0 hours 7 minutes 51 seconds  Total Procedure Duration: 0 hours 8 minutes 36 seconds  Findings:      Hemorrhoids were found on perianal exam.      Scattered small-mouthed diverticula were found in the entire colon.      The exam was otherwise without abnormality on direct and retroflexion       views. Impression:               - Hemorrhoids found on perianal exam.                           - Diverticulosis in the entire examined colon.                           - The examination was  otherwise normal on direct                            and retroflexion views.                           - No specimens collected. Moderate Sedation:      Moderate (conscious) sedation was personally administered by an       anesthesia professional. The following parameters were monitored: oxygen       saturation, heart rate, blood pressure, respiratory rate, EKG, adequacy       of pulmonary ventilation, and response to care. Recommendation:           - Patient has a contact number available for                            emergencies. The signs and symptoms of potential                            delayed complications were discussed with the                            patient. Return to normal  activities tomorrow.                            Written discharge instructions were provided to the                            patient.                           - Advance diet as tolerated.                           - Continue present medications.                           - No repeat colonoscopy due to age.                           - Return to GI clinic as previously scheduled. See                            EGD report Procedure Code(s):        --- Professional ---                           [phone], Colonoscopy, flexible; diagnostic, including                            collection of specimen(s) by brushing or washing,                            when performed (separate procedure) Diagnosis Code(s):        --- Professional ---  K64.9, Unspecified hemorrhoids                           K57.30, Diverticulosis of large intestine without                            perforation or abscess without bleeding                           R93.3, Abnormal findings on diagnostic imaging of                            other parts of digestive tract CPT copyright 2022 American Medical Association. All rights reserved. The codes documented in this report are preliminary and upon coder review may  be revised to meet current compliance requirements. Gerrit Friends. Shaquan Puerta, MD Gennette Pac, MD 09/14/2023 9:55:29 AM This report has been signed electronically. Number of Addenda: 0

## 2023-09-14 NOTE — Discharge Instructions (Addendum)
EGD Discharge instructions Please read the instructions outlined below and refer to this sheet in the next few weeks. These discharge instructions provide you with general information on caring for yourself after you leave the hospital. Your doctor may also give you specific instructions. While your treatment has been planned according to the most current medical practices available, unavoidable complications occasionally occur. If you have any problems or questions after discharge, please call your doctor. ACTIVITY You may resume your regular activity but move at a slower pace for the next 24 hours.  Take frequent rest periods for the next 24 hours.  Walking will help expel (get rid of) the air and reduce the bloated feeling in your abdomen.  No driving for 24 hours (because of the anesthesia (medicine) used during the test).  You may shower.  Do not sign any important legal documents or operate any machinery for 24 hours (because of the anesthesia used during the test).  NUTRITION Drink plenty of fluids.  You may resume your normal diet.  Begin with a light meal and progress to your normal diet.  Avoid alcoholic beverages for 24 hours or as instructed by your caregiver.  MEDICATIONS You may resume your normal medications unless your caregiver tells you otherwise.  WHAT YOU CAN EXPECT TODAY You may experience abdominal discomfort such as a feeling of fullness or "gas" pains.  FOLLOW-UP Your doctor will discuss the results of your test with you.  SEEK IMMEDIATE MEDICAL ATTENTION IF ANY OF THE FOLLOWING OCCUR: Excessive nausea (feeling sick to your stomach) and/or vomiting.  Severe abdominal pain and distention (swelling).  Trouble swallowing.  Temperature over 101 F (37.8 C).  Rectal bleeding or vomiting of blood.     Colonoscopy Discharge Instructions  Read the instructions outlined below and refer to this sheet in the next few weeks. These discharge instructions provide you with  general information on caring for yourself after you leave the hospital. Your doctor may also give you specific instructions. While your treatment has been planned according to the most current medical practices available, unavoidable complications occasionally occur. If you have any problems or questions after discharge, call Dr. Jena Gauss at [phone]. ACTIVITY You may resume your regular activity, but move at a slower pace for the next 24 hours.  Take frequent rest periods for the next 24 hours.  Walking will help get rid of the air and reduce the bloated feeling in your belly (abdomen).  No driving for 24 hours (because of the medicine (anesthesia) used during the test).   Do not sign any important legal documents or operate any machinery for 24 hours (because of the anesthesia used during the test).  NUTRITION Drink plenty of fluids.  You may resume your normal diet as instructed by your doctor.  Begin with a light meal and progress to your normal diet. Heavy or fried foods are harder to digest and may make you feel sick to your stomach (nauseated).  Avoid alcoholic beverages for 24 hours or as instructed.  MEDICATIONS You may resume your normal medications unless your doctor tells you otherwise.  WHAT YOU CAN EXPECT TODAY Some feelings of bloating in the abdomen.  Passage of more gas than usual.  Spotting of blood in your stool or on the toilet paper.  IF YOU HAD POLYPS REMOVED DURING THE COLONOSCOPY: No aspirin products for 7 days or as instructed.  No alcohol for 7 days or as instructed.  Eat a soft diet for the next 24 hours.  FINDING OUT THE RESULTS OF YOUR TEST Not all test results are available during your visit. If your test results are not back during the visit, make an appointment with your caregiver to find out the results. Do not assume everything is normal if you have not heard from your caregiver or the medical facility. It is important for you to follow up on all of your test  results.  SEEK IMMEDIATE MEDICAL ATTENTION IF: You have more than a spotting of blood in your stool.  Your belly is swollen (abdominal distention).  You are nauseated or vomiting.  You have a temperature over 101.  You have abdominal pain or discomfort that is severe or gets worse throughout the day.     Upper GI tract looked good; no explanation for nausea.   Diverticulosis throughout your colon.  No polyp or tumor  A future colonoscopy is not recommended unless new symptoms develop  As discussed, Amitiza may be  contributing to your nausea.  Best to take Amitiza in the middle of a meal-breakfast and supper  Also, Nexium is best taken 30 minutes before a meal.  So, take it 30 minutes before breakfast and supper, not at bedtime  Keep upcoming office visit with [place]  next month.  If nausea not improved may be best to stop Amiriza and go with another for constipation.    At patient request, I called Chrissie Noa at [phone]-reviewed findings and recommendations

## 2023-09-14 NOTE — Transfer of Care (Signed)
Immediate Anesthesia Transfer of Care Note  Patient: Brittany Shields  Procedure(s) Performed: COLONOSCOPY WITH PROPOFOL ESOPHAGOGASTRODUODENOSCOPY (EGD) WITH PROPOFOL  Patient Location: Endoscopy Unit  Anesthesia Type:General  Level of Consciousness: awake and patient cooperative  Airway & Oxygen Therapy: Patient Spontanous Breathing  Post-op Assessment: Report given to RN and Post -op Vital signs reviewed and stable  Post vital signs: Reviewed and stable  Last Vitals:  Vitals Value Taken Time  BP 97/55 09/14/23 0956  Temp 36.6 C 09/14/23 0956  Pulse 76 09/14/23 0956  Resp 17 09/14/23 0956  SpO2 100 % 09/14/23 0956    Last Pain:  Vitals:   09/14/23 0956  TempSrc: Axillary  PainSc: 0-No pain      Patients Stated Pain Goal: 7 (09/14/23 0730)  Complications: No notable events documented.

## 2023-09-14 NOTE — Anesthesia Preprocedure Evaluation (Addendum)
Anesthesia Evaluation  Patient identified by MRN, date of birth, ID band Patient awake    Reviewed: Allergy & Precautions, NPO status , Patient's Chart, lab work & pertinent test results, reviewed documented beta blocker date and time   Airway Mallampati: I  TM Distance: >3 FB Neck ROM: Full    Dental  (+) Caps, Dental Advisory Given   Pulmonary sleep apnea           Cardiovascular + Valvular Problems/Murmurs  Rhythm:Regular     Neuro/Psych  Headaches    GI/Hepatic ,GERD  Medicated and Controlled,,  Endo/Other  Hypothyroidism    Renal/GU      Musculoskeletal  (+) Arthritis ,    Abdominal   Peds  Hematology   Anesthesia Other Findings   Reproductive/Obstetrics                             Anesthesia Physical Anesthesia Plan  ASA: 2  Anesthesia Plan: General   Post-op Pain Management:    Induction: Intravenous  PONV Risk Score and Plan: Propofol infusion  Airway Management Planned: Nasal Cannula  Additional Equipment:   Intra-op Plan:   Post-operative Plan:   Informed Consent: I have reviewed the patients History and Physical, chart, labs and discussed the procedure including the risks, benefits and alternatives for the proposed anesthesia with the patient or authorized representative who has indicated his/her understanding and acceptance.     Dental advisory given  Plan Discussed with: Anesthesiologist  Anesthesia Plan Comments: (Patient states she does not use CPAP at home.)       Anesthesia Quick Evaluation

## 2023-09-19 ENCOUNTER — Encounter (HOSPITAL_COMMUNITY): Payer: Self-pay | Admitting: Internal Medicine

## 2023-09-19 NOTE — Anesthesia Postprocedure Evaluation (Signed)
Anesthesia Post Note  Patient: Brittany Shields  Procedure(s) Performed: COLONOSCOPY WITH PROPOFOL ESOPHAGOGASTRODUODENOSCOPY (EGD) WITH PROPOFOL  Patient location during evaluation: Phase II Anesthesia Type: General Level of consciousness: awake Pain management: pain level controlled Vital Signs Assessment: post-procedure vital signs reviewed and stable Respiratory status: spontaneous breathing and respiratory function stable Cardiovascular status: blood pressure returned to baseline and stable Postop Assessment: no headache and no apparent nausea or vomiting Anesthetic complications: no Comments: Late entry   No notable events documented.   Last Vitals:  Vitals:   09/14/23 0730 09/14/23 0956  BP: (!) 144/76 (!) 97/55  Pulse: 82 76  Resp: (!) 23 17  Temp: 36.6 C 36.6 C  SpO2: 97% 100%    Last Pain:  Vitals:   09/14/23 0956  TempSrc: Axillary  PainSc: 0-No pain                 Windell Norfolk

## 2023-09-25 ENCOUNTER — Ambulatory Visit (INDEPENDENT_AMBULATORY_CARE_PROVIDER_SITE_OTHER): Payer: Medicare Other | Admitting: Gastroenterology

## 2023-10-04 ENCOUNTER — Encounter: Payer: Self-pay | Admitting: Gastroenterology

## 2023-10-04 ENCOUNTER — Ambulatory Visit (INDEPENDENT_AMBULATORY_CARE_PROVIDER_SITE_OTHER): Payer: Medicare Other | Admitting: Gastroenterology

## 2023-10-04 VITALS — BP 113/73 | HR 78 | Temp 99.0°F | Ht 63.0 in | Wt 142.1 lb

## 2023-10-04 DIAGNOSIS — K5900 Constipation, unspecified: Secondary | ICD-10-CM

## 2023-10-04 DIAGNOSIS — K219 Gastro-esophageal reflux disease without esophagitis: Secondary | ICD-10-CM | POA: Diagnosis not present

## 2023-10-04 NOTE — Patient Instructions (Signed)
Continue Amitiza twice a day (with your food) to avoid nausea.   Only take 1/2 to 1 capful of Miralax in evening if you haven't had good bowel movements that day.  We will see you in 6 months!  Have a wonderful holiday season!  I enjoyed seeing you again today! I value our relationship and want to provide genuine, compassionate, and quality care. You may receive a survey regarding your visit with me, and I welcome your feedback! Thanks so much for taking the time to complete this. I look forward to seeing you again.      Gelene Mink, PhD, ANP-BC [facility]

## 2023-10-04 NOTE — Progress Notes (Signed)
Gastroenterology Office Note     Primary Care Physician:  Richardean Chimera, MD  Primary Gastroenterologist: Dr. Jena Gauss    Chief Complaint   Chief Complaint  Patient presents with   post procedure follow up    Follow up after EGD and TCS. States she has not had any more vomiting since procedure. States sometimes she has accidents with bowel movements. Wanted to see if she should be taking miralax, stool softners and amitiza.      History of Present Illness   Brittany Shields is a 72 y.o. female presenting today in follow-up after endoscopy and colonoscopy. Previously seen in Sept 2024 with acute N/V, abdominal pain.    Diagnosed with diverticulitis via CT A/P on 07/22/2023 at [facility]. No evidence of abscess or perforation on imaging. She was treated with Augmentin. Improved. Colonoscopy as below unrevealing other than diverticulosis.   Known history of hemorrhoids s/p banding with likely post-banding bleed that resolved without intervention. No further banding at patient's request.   She had been taking Amitiza without food. N/V now resolved as taking corrently.   N/V resolved. Taking amitiza BID, colace 200 mg in evening, miralax.  Wants to hold off on TIF.    EGD Oct 2024: normal esophagus, minimally polypoid gastric mucosa, normal duodenum  Colonoscopy Oct 2024: hemorrhoids, pancolonic diverticulosis.     Past Medical History:  Diagnosis Date   Arthritis    Chronic sinusitis    Dry eye    Generalized headaches    GERD (gastroesophageal reflux disease)    Hypothyroidism    Laryngopharyngeal reflux (LPR)    Meibomian gland disease    Murmur    OSA (obstructive sleep apnea)    Retinal vein occlusion    Sensorineural hearing loss    TMJ arthralgia     Past Surgical History:  Procedure Laterality Date   ANTERIOR CERVICAL DECOMP/DISCECTOMY FUSION N/A 08/28/2017   Procedure: ANTERIOR CERVICAL DECOMPRESSION AND FUSION CERVICAL FIVE-SIX,CERVICAL SIX-SEVEN;   Surgeon: Julio Sicks, MD;  [facility]: [facility];  Service: Neurosurgery;  Laterality: N/A;  anterior   BIOPSY  01/24/2020   Procedure: BIOPSY;  Surgeon: Corbin Ade, MD;  [facility];  Service: Endoscopy;;  gastric polyp; gastric mucosa   CARPAL TUNNEL RELEASE Right 2001   CATARACT EXTRACTION W/PHACO Right 08/08/2017   Procedure: CATARACT EXTRACTION PHACO AND INTRAOCULAR LENS PLACEMENT (IOC);  Surgeon: Jethro Bolus, MD;  Location: [facility] ORS;  Service: Ophthalmology;  Laterality: Right;  CDE: 4.18   CATARACT EXTRACTION W/PHACO Left 11/10/2017   Procedure: CATARACT EXTRACTION PHACO AND INTRAOCULAR LENS PLACEMENT (IOC);  Surgeon: Jethro Bolus, MD;  Location: [facility] ORS;  Service: Ophthalmology;  Laterality: Left;  CDE: 3.27   CHOLECYSTECTOMY     COLONOSCOPY     COLONOSCOPY N/A 04/03/2020   Procedure: COLONOSCOPY;  Surgeon: Corbin Ade, MD;  [facility];  Service: Endoscopy;  Laterality: N/A;  10:00am   COLONOSCOPY WITH PROPOFOL N/A 09/14/2023   Procedure: COLONOSCOPY WITH PROPOFOL;  Surgeon: Corbin Ade, MD;  [facility];  Service: Endoscopy;  Laterality: N/A;  9:00 am, asa 2   CYST EXCISION     right eye   ESOPHAGOGASTRODUODENOSCOPY N/A 01/24/2020   Procedure: ESOPHAGOGASTRODUODENOSCOPY (EGD);  Surgeon: Corbin Ade, MD;  [facility];  Service: Endoscopy;  Laterality: N/A;  2:00pm   ESOPHAGOGASTRODUODENOSCOPY (EGD) WITH PROPOFOL N/A 09/14/2023   Procedure: ESOPHAGOGASTRODUODENOSCOPY (EGD) WITH PROPOFOL;  Surgeon: Corbin Ade, MD;  [facility];  Service: Endoscopy;  Laterality: N/A;   EYE SURGERY     HAMMER TOE SURGERY Bilateral    IR RADIOLOGIST EVAL & MGMT  07/14/2021   NASAL/SINUS ENDOSCOPY     TUBAL LIGATION     tubal reversal  1991    Current Outpatient Medications  Medication Sig Dispense Refill   acetaminophen (TYLENOL) 500 MG tablet Take 1,000 mg by mouth as needed for moderate pain or headache.     Calcium  Carb-Cholecalciferol (CALCIUM 600+D3 PO) Take 2 tablets by mouth every morning.      Cyanocobalamin (B-12 COMPLIANCE INJECTION) 1000 MCG/ML KIT Inject 1,000 mcg as directed every 30 (thirty) days.     docusate sodium (COLACE) 100 MG capsule Take 200 mg by mouth at bedtime.     esomeprazole (NEXIUM) 40 MG capsule Take 40 mg by mouth 2 (two) times daily.     ferrous sulfate 324 MG TBEC Take 324 mg by mouth daily.     Fezolinetant (VEOZAH) 45 MG TABS Take by mouth.     fluticasone (FLONASE) 50 MCG/ACT nasal spray Place 1 spray into both nostrils 2 (two) times daily.     hydrocortisone (ANUSOL-HC) 2.5 % rectal cream Place 1 Application rectally 2 (two) times daily as needed for hemorrhoids or anal itching. 90 g 3   Hypertonic Nasal Wash (SINUS RINSE NA) Place 1 each into the nose 2 (two) times daily.     levothyroxine (SYNTHROID, LEVOTHROID) 50 MCG tablet Take 50 mcg by mouth daily before breakfast.     lubiprostone (AMITIZA) 24 MCG capsule Take 24 mcg by mouth 2 (two) times daily with a meal.     predniSONE (DELTASONE) 5 MG tablet Take 5 mg by mouth daily with breakfast.     pregabalin (LYRICA) 100 MG capsule Take 100 mg by mouth daily.     Probiotic Product (PROBIOTIC DAILY) CAPS Take 1 capsule by mouth daily.     triamcinolone cream (KENALOG) 0.1 % Apply 1 Application topically as needed.     ondansetron (ZOFRAN-ODT) 4 MG disintegrating tablet Take 4 mg by mouth every 8 (eight) hours as needed. (Patient not taking: Reported on 10/04/2023)     promethazine (PHENERGAN) 12.5 MG suppository SMARTSIG:1 SUPPOS Rectally Every 4-6 Hours PRN (Patient not taking: Reported on 10/04/2023)     No current facility-administered medications for this visit.    Allergies as of 10/04/2023 - Review Complete 10/04/2023  Allergen Reaction Noted   Zithromax [azithromycin] Hives and Rash 08/02/2017   Lifitegrast Itching 05/25/2018    Family History  Problem Relation Age of Onset   Other Mother    Other Father     Colon cancer Neg Hx     Social History   Socioeconomic History   Marital status: Married    Spouse name: Not on file   Number of children: Not on file   Years of education: Not on file   Highest education level: Not on file  Occupational History   Not on file  Tobacco Use   Smoking status: Never    Passive exposure: Never   Smokeless tobacco: Never  Vaping Use   Vaping status: Never Used  Substance and Sexual Activity   Alcohol use: No   Drug use: No   Sexual activity: Never  Other Topics Concern   Not on file  Social History Narrative   Not on file   Social Determinants of Health   Financial Resource Strain: Low Risk  (07/24/2023)  Received from [facility]   Overall Financial Resource Strain (CARDIA)    Difficulty of Paying Living Expenses: Not hard at all  Food Insecurity: No Food Insecurity (07/24/2023)   Received from [facility]   Hunger Vital Sign    Worried About Running Out of Food in the Last Year: Never true    Ran Out of Food in the Last Year: Never true  Transportation Needs: No Transportation Needs (07/24/2023)   Received from [facility] - Transportation    Lack of Transportation (Medical): No    Lack of Transportation (Non-Medical): No  Physical Activity: Sufficiently Active (01/25/2023)   Received from [facility], [facility]   Exercise Vital Sign    Days of Exercise per Week: 5 days    Minutes of Exercise per Session: 40 min  Stress: No Stress Concern Present (01/25/2023)   Received from [facility], [facility] of Occupational Health - Occupational Stress Questionnaire    Feeling of Stress : Not at all  Social Connections: Not on file  Intimate Partner Violence: Not At Risk (01/25/2023)   [facility] from [facility]   Humiliation, Afraid, Rape, and Kick questionnaire    Fear of Current or Ex-Partner: No    Emotionally Abused: No    Physically Abused: No    Sexually  Abused: No     Review of Systems   Gen: Denies any fever, chills, fatigue, weight loss, lack of appetite.  CV: Denies chest pain, heart palpitations, peripheral edema, syncope.  Resp: Denies shortness of breath at rest or with exertion. Denies wheezing or cough.  GI: Denies dysphagia or odynophagia. Denies jaundice, hematemesis, fecal incontinence. GU : Denies urinary burning, urinary frequency, urinary hesitancy MS: Denies joint pain, muscle weakness, cramps, or limitation of movement.  Derm: Denies rash, itching, dry skin Psych: Denies depression, anxiety, memory loss, and confusion Heme: Denies bruising, bleeding, and enlarged lymph nodes.   Physical Exam   BP 113/73 (BP Location: Right Arm, Patient Position: Sitting, Cuff Size: Normal)   Pulse 78   Temp 99 F (37.2 C) (Oral)   Ht 5\' 3"  (1.6 m)   Wt 142 lb 1.6 oz (64.5 kg)   BMI 25.17 kg/m  General:   Alert and oriented. Pleasant and cooperative. Well-nourished and well-developed.  Head:  Normocephalic and atraumatic. Eyes:  Without icterus Abdomen:  +BS, soft, non-tender and non-distended. No HSM noted. No guarding or rebound. No masses appreciated.  Rectal:  Deferred  Msk:  Symmetrical without gross deformities. Normal posture. Extremities:  Without edema. Neurologic:  Alert and  oriented x4;  grossly normal neurologically. Skin:  Intact without significant lesions or rashes. Psych:  Alert and cooperative. Normal mood and affect.   Assessment   N/V: culprit Amitiza on empty stomach and now resolved with taking BID with food. EGD as noted above.   Constipation: Amitiza 24 mcg po BID, stool softener, Miralax. Will stop Miralax as overshooting the mark at times.   GERD: had started evaluation for TIF but does not want to pursue any further .   PLAN    Continue Amitiza 24 mcg po BID with food Titrate Miralax each evening if needed 6 month follow-up    Gelene Mink, PhD, ANP-BC [facility] Gastroenterology

## 2023-10-05 DIAGNOSIS — Z23 Encounter for immunization: Secondary | ICD-10-CM | POA: Diagnosis not present

## 2023-10-05 DIAGNOSIS — E538 Deficiency of other specified B group vitamins: Secondary | ICD-10-CM | POA: Diagnosis not present

## 2023-10-05 DIAGNOSIS — R030 Elevated blood-pressure reading, without diagnosis of hypertension: Secondary | ICD-10-CM | POA: Diagnosis not present

## 2023-10-17 DIAGNOSIS — K219 Gastro-esophageal reflux disease without esophagitis: Secondary | ICD-10-CM | POA: Diagnosis not present

## 2023-10-17 DIAGNOSIS — J3489 Other specified disorders of nose and nasal sinuses: Secondary | ICD-10-CM | POA: Diagnosis not present

## 2023-10-17 DIAGNOSIS — J328 Other chronic sinusitis: Secondary | ICD-10-CM | POA: Diagnosis not present

## 2023-10-17 DIAGNOSIS — J310 Chronic rhinitis: Secondary | ICD-10-CM | POA: Diagnosis not present

## 2023-10-17 DIAGNOSIS — Z9889 Other specified postprocedural states: Secondary | ICD-10-CM | POA: Diagnosis not present

## 2023-11-09 DIAGNOSIS — R030 Elevated blood-pressure reading, without diagnosis of hypertension: Secondary | ICD-10-CM | POA: Diagnosis not present

## 2023-11-09 DIAGNOSIS — E538 Deficiency of other specified B group vitamins: Secondary | ICD-10-CM | POA: Diagnosis not present

## 2023-12-11 DIAGNOSIS — E538 Deficiency of other specified B group vitamins: Secondary | ICD-10-CM | POA: Diagnosis not present

## 2023-12-21 DIAGNOSIS — J101 Influenza due to other identified influenza virus with other respiratory manifestations: Secondary | ICD-10-CM | POA: Diagnosis not present

## 2023-12-21 DIAGNOSIS — Z20828 Contact with and (suspected) exposure to other viral communicable diseases: Secondary | ICD-10-CM | POA: Diagnosis not present

## 2023-12-21 DIAGNOSIS — Z6824 Body mass index (BMI) 24.0-24.9, adult: Secondary | ICD-10-CM | POA: Diagnosis not present

## 2023-12-21 DIAGNOSIS — Z2089 Contact with and (suspected) exposure to other communicable diseases: Secondary | ICD-10-CM | POA: Diagnosis not present

## 2023-12-25 DIAGNOSIS — R202 Paresthesia of skin: Secondary | ICD-10-CM | POA: Diagnosis not present

## 2023-12-26 DIAGNOSIS — N183 Chronic kidney disease, stage 3 unspecified: Secondary | ICD-10-CM | POA: Diagnosis not present

## 2023-12-26 DIAGNOSIS — E7849 Other hyperlipidemia: Secondary | ICD-10-CM | POA: Diagnosis not present

## 2023-12-26 DIAGNOSIS — E039 Hypothyroidism, unspecified: Secondary | ICD-10-CM | POA: Diagnosis not present

## 2023-12-26 DIAGNOSIS — K219 Gastro-esophageal reflux disease without esophagitis: Secondary | ICD-10-CM | POA: Diagnosis not present

## 2023-12-26 DIAGNOSIS — E782 Mixed hyperlipidemia: Secondary | ICD-10-CM | POA: Diagnosis not present

## 2024-01-03 DIAGNOSIS — E039 Hypothyroidism, unspecified: Secondary | ICD-10-CM | POA: Diagnosis not present

## 2024-01-03 DIAGNOSIS — M3130 Wegener's granulomatosis without renal involvement: Secondary | ICD-10-CM | POA: Diagnosis not present

## 2024-01-03 DIAGNOSIS — F331 Major depressive disorder, recurrent, moderate: Secondary | ICD-10-CM | POA: Diagnosis not present

## 2024-01-03 DIAGNOSIS — Z6824 Body mass index (BMI) 24.0-24.9, adult: Secondary | ICD-10-CM | POA: Diagnosis not present

## 2024-01-03 DIAGNOSIS — E782 Mixed hyperlipidemia: Secondary | ICD-10-CM | POA: Diagnosis not present

## 2024-01-11 DIAGNOSIS — E538 Deficiency of other specified B group vitamins: Secondary | ICD-10-CM | POA: Diagnosis not present

## 2024-01-23 DIAGNOSIS — H35041 Retinal micro-aneurysms, unspecified, right eye: Secondary | ICD-10-CM | POA: Diagnosis not present

## 2024-01-23 DIAGNOSIS — H3561 Retinal hemorrhage, right eye: Secondary | ICD-10-CM | POA: Diagnosis not present

## 2024-01-23 DIAGNOSIS — H18463 Peripheral corneal degeneration, bilateral: Secondary | ICD-10-CM | POA: Diagnosis not present

## 2024-01-23 DIAGNOSIS — H43812 Vitreous degeneration, left eye: Secondary | ICD-10-CM | POA: Diagnosis not present

## 2024-01-23 DIAGNOSIS — H04123 Dry eye syndrome of bilateral lacrimal glands: Secondary | ICD-10-CM | POA: Diagnosis not present

## 2024-01-23 DIAGNOSIS — H35372 Puckering of macula, left eye: Secondary | ICD-10-CM | POA: Diagnosis not present

## 2024-01-23 DIAGNOSIS — H348112 Central retinal vein occlusion, right eye, stable: Secondary | ICD-10-CM | POA: Diagnosis not present

## 2024-02-08 DIAGNOSIS — E538 Deficiency of other specified B group vitamins: Secondary | ICD-10-CM | POA: Diagnosis not present

## 2024-02-19 DIAGNOSIS — M545 Low back pain, unspecified: Secondary | ICD-10-CM | POA: Diagnosis not present

## 2024-02-19 DIAGNOSIS — Z6825 Body mass index (BMI) 25.0-25.9, adult: Secondary | ICD-10-CM | POA: Diagnosis not present

## 2024-02-20 ENCOUNTER — Encounter: Payer: Self-pay | Admitting: Gastroenterology

## 2024-02-21 DIAGNOSIS — Z961 Presence of intraocular lens: Secondary | ICD-10-CM | POA: Diagnosis not present

## 2024-02-21 DIAGNOSIS — H04123 Dry eye syndrome of bilateral lacrimal glands: Secondary | ICD-10-CM | POA: Diagnosis not present

## 2024-02-21 DIAGNOSIS — H532 Diplopia: Secondary | ICD-10-CM | POA: Diagnosis not present

## 2024-02-21 DIAGNOSIS — H18419 Arcus senilis, unspecified eye: Secondary | ICD-10-CM | POA: Diagnosis not present

## 2024-03-11 DIAGNOSIS — E538 Deficiency of other specified B group vitamins: Secondary | ICD-10-CM | POA: Diagnosis not present

## 2024-04-03 ENCOUNTER — Encounter: Payer: Self-pay | Admitting: Gastroenterology

## 2024-04-03 ENCOUNTER — Ambulatory Visit (INDEPENDENT_AMBULATORY_CARE_PROVIDER_SITE_OTHER): Payer: Medicare Other | Admitting: Gastroenterology

## 2024-04-03 VITALS — BP 121/72 | HR 77 | Temp 98.2°F | Ht 63.0 in | Wt 146.9 lb

## 2024-04-03 DIAGNOSIS — K219 Gastro-esophageal reflux disease without esophagitis: Secondary | ICD-10-CM

## 2024-04-03 DIAGNOSIS — K5900 Constipation, unspecified: Secondary | ICD-10-CM | POA: Diagnosis not present

## 2024-04-03 MED ORDER — FAMOTIDINE 40 MG PO TABS: 40.0000 mg | ORAL_TABLET | Freq: Every day | ORAL | 5 refills | Status: DC

## 2024-04-03 NOTE — Progress Notes (Unsigned)
 Gastroenterology Office Note     Primary Care Physician:  Leesa Pulling, MD  Primary Gastroenterologist: Dr. Riley Cheadle    Chief Complaint   Chief Complaint  Patient presents with   Follow-up    Pt arrives for follow up. Pt takes Nexium for acid reflux. Having some issues with IBS. States she has accidents at time; goes on average about 3 times a day. Does have some issues with constipation at times. Does have hemorrhoids that bleed at times.     History of Present Illness   Brittany Shields is a 73 y.o. female presenting today with a history of GERD, constipation, hemorrhoids s/p banding with likely post-banding bleed that resolved without intervention. No further banding at patient's request. Last seen in Nov 2024 and here for routine follow-up.    Amitiza once daily. Every few weeks may have trouble going. Will have lower abdominal pressure if not going. Occasionally will have fecal seepage every 2 weeks but doesn't feel often. Has BM at least 3 times a day. Sometimes all in morning. Sometimes throughout the day. Doesn't like to go out in morning because of frequency. Not needing laxatives. On days she doesn't go, which is seldom, she will start taking a second dose of Amitiza. No fiber supplements as this worsened gas and bloating.   Nexium BID for GERD. Unable to wean down to once a day. Hesitant to do TIF procedure but did discuss it.   EGD Oct 2024: normal esophagus, minimally polypoid gastric mucosa, normal duodenum   Colonoscopy Oct 2024: hemorrhoids, pancolonic diverticulosis.    Past Medical History:  Diagnosis Date   Arthritis    Chronic sinusitis    Dry eye    Generalized headaches    GERD (gastroesophageal reflux disease)    Hypothyroidism    Laryngopharyngeal reflux (LPR)    Meibomian gland disease    Murmur    OSA (obstructive sleep apnea)    Retinal vein occlusion    Sensorineural hearing loss    TMJ arthralgia     Past Surgical History:  Procedure  Laterality Date   ANTERIOR CERVICAL DECOMP/DISCECTOMY FUSION N/A 08/28/2017   Procedure: ANTERIOR CERVICAL DECOMPRESSION AND FUSION CERVICAL FIVE-SIX,CERVICAL SIX-SEVEN;  Surgeon: Agustina Aldrich, MD;  Location: [facility] OR;  Service: Neurosurgery;  Laterality: N/A;  anterior   BIOPSY  01/24/2020   Procedure: BIOPSY;  Surgeon: Suzette Espy, MD;  [facility];  Service: Endoscopy;;  gastric polyp; gastric mucosa   CARPAL TUNNEL RELEASE Right 2001   CATARACT EXTRACTION W/PHACO Right 08/08/2017   Procedure: CATARACT EXTRACTION PHACO AND INTRAOCULAR LENS PLACEMENT (IOC);  Surgeon: Albert Huff, MD;  Location: [facility];  Service: Ophthalmology;  Laterality: Right;  CDE: 4.18   CATARACT EXTRACTION W/PHACO Left 11/10/2017   Procedure: CATARACT EXTRACTION PHACO AND INTRAOCULAR LENS PLACEMENT (IOC);  Surgeon: Albert Huff, MD;  Location: [facility];  Service: Ophthalmology;  Laterality: Left;  CDE: 3.27   CHOLECYSTECTOMY     COLONOSCOPY     COLONOSCOPY N/A 04/03/2020   Procedure: COLONOSCOPY;  Surgeon: Suzette Espy, MD;  [facility];  Service: Endoscopy;  Laterality: N/A;  10:00am   COLONOSCOPY WITH PROPOFOL  N/A 09/14/2023   Procedure: COLONOSCOPY WITH PROPOFOL ;  Surgeon: Suzette Espy, MD;  [facility];  Service: Endoscopy;  Laterality: N/A;  9:00 am, asa 2   CYST EXCISION     right eye   ESOPHAGOGASTRODUODENOSCOPY N/A 01/24/2020   Procedure: ESOPHAGOGASTRODUODENOSCOPY (EGD);  Surgeon: Garnette Ka  M, MD;  [facility];  Service: Endoscopy;  Laterality: N/A;  2:00pm   ESOPHAGOGASTRODUODENOSCOPY (EGD) WITH PROPOFOL  N/A 09/14/2023   Procedure: ESOPHAGOGASTRODUODENOSCOPY (EGD) WITH PROPOFOL ;  Surgeon: Suzette Espy, MD;  [facility];  Service: Endoscopy;  Laterality: N/A;   EYE SURGERY     HAMMER TOE SURGERY Bilateral    IR RADIOLOGIST EVAL & MGMT  07/14/2021   NASAL/SINUS ENDOSCOPY     TUBAL LIGATION     tubal reversal  1991    Current Outpatient  Medications  Medication Sig Dispense Refill   acetaminophen  (TYLENOL ) 500 MG tablet Take 1,000 mg by mouth as needed for moderate pain or headache.     Calcium Carb-Cholecalciferol (CALCIUM 600+D3 PO) Take 2 tablets by mouth every morning.      Cyanocobalamin  (B-12 COMPLIANCE INJECTION) 1000 MCG/ML KIT Inject 1,000 mcg as directed every 30 (thirty) days.     docusate sodium  (COLACE) 100 MG capsule Take 200 mg by mouth at bedtime.     esomeprazole (NEXIUM) 40 MG capsule Take 40 mg by mouth 2 (two) times daily.     ferrous sulfate 324 MG TBEC Take 324 mg by mouth daily.     Fezolinetant (VEOZAH) 45 MG TABS Take by mouth.     fluticasone (FLONASE) 50 MCG/ACT nasal spray Place 1 spray into both nostrils 2 (two) times daily.     hydrocortisone  (ANUSOL -HC) 2.5 % rectal cream Place 1 Application rectally 2 (two) times daily as needed for hemorrhoids or anal itching. 90 g 3   Hypertonic Nasal Wash (SINUS RINSE NA) Place 1 each into the nose 2 (two) times daily.     levothyroxine  (SYNTHROID , LEVOTHROID) 50 MCG tablet Take 50 mcg by mouth daily before breakfast.     lubiprostone (AMITIZA) 24 MCG capsule Take 24 mcg by mouth 2 (two) times daily with a meal.     ondansetron  (ZOFRAN -ODT) 4 MG disintegrating tablet Take 4 mg by mouth every 8 (eight) hours as needed.     predniSONE  (DELTASONE ) 5 MG tablet Take 5 mg by mouth daily with breakfast.     pregabalin (LYRICA) 100 MG capsule Take 100 mg by mouth daily.     Probiotic Product (PROBIOTIC DAILY) CAPS Take 1 capsule by mouth daily.     promethazine  (PHENERGAN ) 12.5 MG suppository      triamcinolone cream (KENALOG) 0.1 % Apply 1 Application topically as needed.     No current facility-administered medications for this visit.    Allergies as of 04/03/2024 - Review Complete 04/03/2024  Allergen Reaction Noted   Zithromax [azithromycin] Hives and Rash 08/02/2017   Lifitegrast Itching 05/25/2018    Family History  Problem Relation Age of Onset    Other Mother    Other Father    Colon cancer Neg Hx     Social History   Socioeconomic History   Marital status: Married    Spouse name: Not on file   Number of children: Not on file   Years of education: Not on file   Highest education level: Not on file  Occupational History   Not on file  Tobacco Use   Smoking status: Never    Passive exposure: Never   Smokeless tobacco: Never  Vaping Use   Vaping status: Never Used  Substance and Sexual Activity   Alcohol  use: No   Drug use: No   Sexual activity: Never  Other Topics Concern   Not on file  Social History Narrative  Not on file   Social Drivers of Health   Financial Resource Strain: Low Risk  (07/24/2023)   Received [facility]   Overall Financial Resource Strain (CARDIA)    Difficulty of Paying Living Expenses: Not hard at all  Food Insecurity: No Food Insecurity (07/24/2023)   Received from [facility]   Hunger Vital Sign    Worried About Running Out of Food in the Last Year: Never true    Ran Out of Food in the Last Year: Never true  Transportation Needs: No Transportation Needs (07/24/2023)   Received from [facility] - Transportation    Lack of Transportation (Medical): No    Lack of Transportation (Non-Medical): No  Physical Activity: Sufficiently Active (01/25/2023)   Received from [facility], [facility]   Exercise Vital Sign    Days of Exercise per Week: 5 days    Minutes of Exercise per Session: 40 min  Stress: No Stress Concern Present (01/25/2023)   Received from [facility], [facility] of Occupational Health - Occupational Stress Questionnaire    Feeling of Stress : Not at all  Social Connections: Not on file  Intimate Partner Violence: Not At Risk (01/25/2023)   Received from [facility], [facility]   Humiliation, Afraid, Rape, and Kick questionnaire    Fear of Current or Ex-Partner: No    Emotionally Abused: No    Physically  Abused: No    Sexually Abused: No     Review of Systems   Gen: Denies any fever, chills, fatigue, weight loss, lack of appetite.  CV: Denies chest pain, heart palpitations, peripheral edema, syncope.  Resp: Denies shortness of breath at rest or with exertion. Denies wheezing or cough.  GI: Denies dysphagia or odynophagia. Denies jaundice, hematemesis, fecal incontinence. GU : Denies urinary burning, urinary frequency, urinary hesitancy MS: Denies joint pain, muscle weakness, cramps, or limitation of movement.  Derm: Denies rash, itching, dry skin Psych: Denies depression, anxiety, memory loss, and confusion Heme: Denies bruising, bleeding, and enlarged lymph nodes.   Physical Exam   BP 121/72   Pulse 77   Temp 98.2 F (36.8 C)   Ht 5\' 3"  (1.6 m)   Wt 146 lb 14.4 oz (66.6 kg)   BMI 26.02 kg/m  General:   Alert and oriented. Pleasant and cooperative. Well-nourished and well-developed.  Head:  Normocephalic and atraumatic. Eyes:  Without icterus Abdomen:  +BS, soft, non-tender and non-distended. No HSM noted. No guarding or rebound. No masses appreciated.  Rectal:  Deferred  Msk:  Symmetrical without gross deformities. Normal posture. Extremities:  Without edema. Neurologic:  Alert and  oriented x4;  grossly normal neurologically. Skin:  Intact without significant lesions or rashes. Psych:  Alert and cooperative. Normal mood and affect.   Assessment   Brittany Shields is a 73 y.o. female presenting today with a history of GERD, constipation, hemorrhoids s/p banding with likely post-banding bleed that resolved without intervention, here for routine follow-up.  Constipation: currently on Amitiza 24 mcg once daily; we discussed she can take this BID if needed. Known hx of fecal seepage has improved and is secondary to likely overflow and internal hemorrhoids. Fiber has worsened symptoms historically.   GERD: on Nexium BID and unable to wean to daily. She has had a visit to  discuss TIF procedure but wants to hold off on this. We will try to wean to once daily  and add pepcid in evenings if needed.      PLAN   Continue Amitiza daily and can take BID if needed Nexium each morning and add pepcid if needed for breakthrough. If unable to tolerate, may take BID nexium Return in 6 months or sooner if needed   Delman Ferns, PhD, ANP-BC [facility] Gastroenterology

## 2024-04-03 NOTE — Patient Instructions (Signed)
 You can continue Nexium each morning, and let's try to hold off on the evening dose. Instead, take pepcid in the evening if needed. If this is not helpful, you can go back to twice a day Nexium dosing.  Let's try Amitiza with your evening meal, and you can see if that helps with the predictability of bowel movements. You can play around with the dosing of this (just no more than 2 gelcaps per day). If you have a day you are more constipated, take a second dose.   We will see you in 6 months! Please call or message if you need to be seen earlier!  I enjoyed seeing you again today! I value our relationship and want to provide genuine, compassionate, and quality care. You may receive a survey regarding your visit with me, and I welcome your feedback! Thanks so much for taking the time to complete this. I look forward to seeing you again.      Delman Ferns, PhD, ANP-BC [facility] Gastroenterology

## 2024-04-10 DIAGNOSIS — E538 Deficiency of other specified B group vitamins: Secondary | ICD-10-CM | POA: Diagnosis not present

## 2024-04-11 DIAGNOSIS — Z1231 Encounter for screening mammogram for malignant neoplasm of breast: Secondary | ICD-10-CM | POA: Diagnosis not present

## 2024-04-17 DIAGNOSIS — Z79899 Other long term (current) drug therapy: Secondary | ICD-10-CM | POA: Diagnosis not present

## 2024-04-17 DIAGNOSIS — N951 Menopausal and female climacteric states: Secondary | ICD-10-CM | POA: Diagnosis not present

## 2024-04-25 DIAGNOSIS — E538 Deficiency of other specified B group vitamins: Secondary | ICD-10-CM | POA: Diagnosis not present

## 2024-04-25 DIAGNOSIS — N183 Chronic kidney disease, stage 3 unspecified: Secondary | ICD-10-CM | POA: Diagnosis not present

## 2024-04-25 DIAGNOSIS — Z131 Encounter for screening for diabetes mellitus: Secondary | ICD-10-CM | POA: Diagnosis not present

## 2024-04-25 DIAGNOSIS — D559 Anemia due to enzyme disorder, unspecified: Secondary | ICD-10-CM | POA: Diagnosis not present

## 2024-04-25 DIAGNOSIS — E039 Hypothyroidism, unspecified: Secondary | ICD-10-CM | POA: Diagnosis not present

## 2024-04-25 DIAGNOSIS — E7849 Other hyperlipidemia: Secondary | ICD-10-CM | POA: Diagnosis not present

## 2024-04-25 DIAGNOSIS — Z1321 Encounter for screening for nutritional disorder: Secondary | ICD-10-CM | POA: Diagnosis not present

## 2024-05-02 DIAGNOSIS — K2100 Gastro-esophageal reflux disease with esophagitis, without bleeding: Secondary | ICD-10-CM | POA: Diagnosis not present

## 2024-05-02 DIAGNOSIS — G4733 Obstructive sleep apnea (adult) (pediatric): Secondary | ICD-10-CM | POA: Diagnosis not present

## 2024-05-02 DIAGNOSIS — Z6825 Body mass index (BMI) 25.0-25.9, adult: Secondary | ICD-10-CM | POA: Diagnosis not present

## 2024-05-02 DIAGNOSIS — Z23 Encounter for immunization: Secondary | ICD-10-CM | POA: Diagnosis not present

## 2024-05-02 DIAGNOSIS — F331 Major depressive disorder, recurrent, moderate: Secondary | ICD-10-CM | POA: Diagnosis not present

## 2024-05-02 DIAGNOSIS — G43909 Migraine, unspecified, not intractable, without status migrainosus: Secondary | ICD-10-CM | POA: Diagnosis not present

## 2024-05-03 ENCOUNTER — Telehealth: Payer: Self-pay

## 2024-05-03 NOTE — Telephone Encounter (Signed)
 Pt is requesting that her Rx for Famotidine  Tabs 40mg   be sent to [facility] pharmacy in a 90 day supply.

## 2024-05-06 MED ORDER — FAMOTIDINE 40 MG PO TABS: 40.0000 mg | ORAL_TABLET | Freq: Every day | ORAL | 3 refills | Status: AC

## 2024-05-06 NOTE — Addendum Note (Signed)
 Addended by: SHIRLEAN THERISA ORN on: 05/06/2024 05:25 PM   Modules accepted: Orders

## 2024-05-06 NOTE — Telephone Encounter (Signed)
 Completed.

## 2024-05-07 NOTE — Telephone Encounter (Signed)
 Phoned and advised the pt of the Rx being sent to her pharmacy in a 90 day supply. Pt expressed understanding

## 2024-05-10 DIAGNOSIS — D519 Vitamin B12 deficiency anemia, unspecified: Secondary | ICD-10-CM | POA: Diagnosis not present

## 2024-06-10 DIAGNOSIS — E538 Deficiency of other specified B group vitamins: Secondary | ICD-10-CM | POA: Diagnosis not present

## 2024-07-01 DIAGNOSIS — Z6825 Body mass index (BMI) 25.0-25.9, adult: Secondary | ICD-10-CM | POA: Diagnosis not present

## 2024-07-01 DIAGNOSIS — L237 Allergic contact dermatitis due to plants, except food: Secondary | ICD-10-CM | POA: Diagnosis not present

## 2024-07-01 DIAGNOSIS — M25562 Pain in left knee: Secondary | ICD-10-CM | POA: Diagnosis not present

## 2024-07-06 DIAGNOSIS — L237 Allergic contact dermatitis due to plants, except food: Secondary | ICD-10-CM | POA: Diagnosis not present

## 2024-07-06 DIAGNOSIS — Z6825 Body mass index (BMI) 25.0-25.9, adult: Secondary | ICD-10-CM | POA: Diagnosis not present

## 2024-07-09 DIAGNOSIS — R1033 Periumbilical pain: Secondary | ICD-10-CM | POA: Diagnosis not present

## 2024-07-09 DIAGNOSIS — R112 Nausea with vomiting, unspecified: Secondary | ICD-10-CM | POA: Diagnosis not present

## 2024-07-09 DIAGNOSIS — Z20822 Contact with and (suspected) exposure to covid-19: Secondary | ICD-10-CM | POA: Diagnosis not present

## 2024-07-09 DIAGNOSIS — R748 Abnormal levels of other serum enzymes: Secondary | ICD-10-CM | POA: Diagnosis not present

## 2024-07-09 DIAGNOSIS — N200 Calculus of kidney: Secondary | ICD-10-CM | POA: Diagnosis not present

## 2024-07-09 DIAGNOSIS — K219 Gastro-esophageal reflux disease without esophagitis: Secondary | ICD-10-CM | POA: Diagnosis not present

## 2024-07-09 DIAGNOSIS — R1010 Upper abdominal pain, unspecified: Secondary | ICD-10-CM | POA: Diagnosis not present

## 2024-07-09 DIAGNOSIS — Z9049 Acquired absence of other specified parts of digestive tract: Secondary | ICD-10-CM | POA: Diagnosis not present

## 2024-07-09 DIAGNOSIS — R109 Unspecified abdominal pain: Secondary | ICD-10-CM | POA: Diagnosis not present

## 2024-07-09 DIAGNOSIS — M438X4 Other specified deforming dorsopathies, thoracic region: Secondary | ICD-10-CM | POA: Diagnosis not present

## 2024-07-12 DIAGNOSIS — E538 Deficiency of other specified B group vitamins: Secondary | ICD-10-CM | POA: Diagnosis not present

## 2024-07-16 ENCOUNTER — Ambulatory Visit: Admitting: Gastroenterology

## 2024-07-17 ENCOUNTER — Ambulatory Visit: Payer: Medicare Other | Admitting: Gastroenterology

## 2024-08-19 DIAGNOSIS — E538 Deficiency of other specified B group vitamins: Secondary | ICD-10-CM | POA: Diagnosis not present

## 2024-08-28 ENCOUNTER — Encounter (INDEPENDENT_AMBULATORY_CARE_PROVIDER_SITE_OTHER): Payer: Self-pay | Admitting: Gastroenterology

## 2024-08-28 ENCOUNTER — Encounter: Payer: Self-pay | Admitting: Gastroenterology

## 2024-09-03 DIAGNOSIS — E7849 Other hyperlipidemia: Secondary | ICD-10-CM | POA: Diagnosis not present

## 2024-09-03 DIAGNOSIS — D559 Anemia due to enzyme disorder, unspecified: Secondary | ICD-10-CM | POA: Diagnosis not present

## 2024-09-03 DIAGNOSIS — E039 Hypothyroidism, unspecified: Secondary | ICD-10-CM | POA: Diagnosis not present

## 2024-09-09 DIAGNOSIS — Z23 Encounter for immunization: Secondary | ICD-10-CM | POA: Diagnosis not present

## 2024-09-10 DIAGNOSIS — E039 Hypothyroidism, unspecified: Secondary | ICD-10-CM | POA: Diagnosis not present

## 2024-09-10 DIAGNOSIS — M797 Fibromyalgia: Secondary | ICD-10-CM | POA: Diagnosis not present

## 2024-09-10 DIAGNOSIS — Z6826 Body mass index (BMI) 26.0-26.9, adult: Secondary | ICD-10-CM | POA: Diagnosis not present

## 2024-09-10 DIAGNOSIS — G629 Polyneuropathy, unspecified: Secondary | ICD-10-CM | POA: Diagnosis not present

## 2024-09-10 DIAGNOSIS — Z0001 Encounter for general adult medical examination with abnormal findings: Secondary | ICD-10-CM | POA: Diagnosis not present

## 2024-09-10 DIAGNOSIS — G43909 Migraine, unspecified, not intractable, without status migrainosus: Secondary | ICD-10-CM | POA: Diagnosis not present

## 2024-09-10 DIAGNOSIS — Z1389 Encounter for screening for other disorder: Secondary | ICD-10-CM | POA: Diagnosis not present

## 2024-09-10 DIAGNOSIS — J4540 Moderate persistent asthma, uncomplicated: Secondary | ICD-10-CM | POA: Diagnosis not present

## 2024-09-10 DIAGNOSIS — M3130 Wegener's granulomatosis without renal involvement: Secondary | ICD-10-CM | POA: Diagnosis not present

## 2024-09-10 DIAGNOSIS — Z1331 Encounter for screening for depression: Secondary | ICD-10-CM | POA: Diagnosis not present

## 2024-09-10 DIAGNOSIS — K2100 Gastro-esophageal reflux disease with esophagitis, without bleeding: Secondary | ICD-10-CM | POA: Diagnosis not present

## 2024-09-10 DIAGNOSIS — I350 Nonrheumatic aortic (valve) stenosis: Secondary | ICD-10-CM | POA: Diagnosis not present

## 2024-09-18 DIAGNOSIS — H04123 Dry eye syndrome of bilateral lacrimal glands: Secondary | ICD-10-CM | POA: Diagnosis not present

## 2024-09-18 DIAGNOSIS — H18419 Arcus senilis, unspecified eye: Secondary | ICD-10-CM | POA: Diagnosis not present

## 2024-09-18 DIAGNOSIS — Z961 Presence of intraocular lens: Secondary | ICD-10-CM | POA: Diagnosis not present

## 2024-09-18 DIAGNOSIS — H5000 Unspecified esotropia: Secondary | ICD-10-CM | POA: Diagnosis not present

## 2024-09-18 DIAGNOSIS — H532 Diplopia: Secondary | ICD-10-CM | POA: Diagnosis not present

## 2024-09-19 DIAGNOSIS — E538 Deficiency of other specified B group vitamins: Secondary | ICD-10-CM | POA: Diagnosis not present

## 2024-09-26 DIAGNOSIS — Z23 Encounter for immunization: Secondary | ICD-10-CM | POA: Diagnosis not present

## 2024-10-14 DIAGNOSIS — Z0000 Encounter for general adult medical examination without abnormal findings: Secondary | ICD-10-CM | POA: Diagnosis not present

## 2024-10-14 DIAGNOSIS — I350 Nonrheumatic aortic (valve) stenosis: Secondary | ICD-10-CM | POA: Diagnosis not present

## 2024-10-14 DIAGNOSIS — Z8249 Family history of ischemic heart disease and other diseases of the circulatory system: Secondary | ICD-10-CM | POA: Diagnosis not present

## 2024-10-18 DIAGNOSIS — M7989 Other specified soft tissue disorders: Secondary | ICD-10-CM | POA: Diagnosis not present

## 2024-10-18 DIAGNOSIS — Z6825 Body mass index (BMI) 25.0-25.9, adult: Secondary | ICD-10-CM | POA: Diagnosis not present

## 2024-10-18 DIAGNOSIS — M79672 Pain in left foot: Secondary | ICD-10-CM | POA: Diagnosis not present

## 2024-10-18 DIAGNOSIS — M79671 Pain in right foot: Secondary | ICD-10-CM | POA: Diagnosis not present

## 2024-10-23 DIAGNOSIS — Z9229 Personal history of other drug therapy: Secondary | ICD-10-CM | POA: Diagnosis not present

## 2024-10-23 DIAGNOSIS — N951 Menopausal and female climacteric states: Secondary | ICD-10-CM | POA: Diagnosis not present

## 2024-10-24 ENCOUNTER — Ambulatory Visit (INDEPENDENT_AMBULATORY_CARE_PROVIDER_SITE_OTHER): Admitting: Gastroenterology

## 2024-10-24 VITALS — BP 135/80 | HR 67 | Temp 98.6°F | Ht 63.0 in | Wt 147.8 lb

## 2024-10-24 DIAGNOSIS — K5900 Constipation, unspecified: Secondary | ICD-10-CM | POA: Diagnosis not present

## 2024-10-24 DIAGNOSIS — K219 Gastro-esophageal reflux disease without esophagitis: Secondary | ICD-10-CM | POA: Diagnosis not present

## 2024-10-24 DIAGNOSIS — R143 Flatulence: Secondary | ICD-10-CM | POA: Diagnosis not present

## 2024-10-24 NOTE — Patient Instructions (Signed)
 It is so good to see you!  I am glad things are going well.  I have included a handout on foods that could be causing increased gas.  Please call with any concerns, otherwise we will see you in 1 year!  Have a wonderful holiday season!  I enjoyed seeing you again today! I value our relationship and want to provide genuine, compassionate, and quality care. You may receive a survey regarding your visit with me, and I welcome your feedback! Thanks so much for taking the time to complete this. I look forward to seeing you again.      Therisa MICAEL Stager, PhD, ANP-BC [facility] Gastroenterology

## 2024-10-24 NOTE — Progress Notes (Signed)
 Gastroenterology Office Note     Primary Care Physician:  Toribio Jerel MATSU, MD  Primary Gastroenterologist: Dr Shaaron    Chief Complaint   Chief Complaint  Patient presents with   Follow-up    Follow up on GERD and it is better     History of Present Illness   Brittany Shields is a 73 y.o. female presenting today with a history of GERD, constipation, hemorrhoids s/p banding with likely post-banding bleed that resolved without intervention. No further banding at patient's request. Last seen in May 2025 for constipation and GERD; here now for routine follow-up.  Recommended weaning down Nexium from BID to once daily at last visit.   Mild queasiness in morning but eats and then does well. No dysphagia. GERD controlled on once daily dosing. Does well with taking famotidine  at night.   Amitiza 24 mcg once daily and taking at night. Stopped fiber because was too gassy. Takes gas-x as needed.   No other GI concerns today.     EGD Oct 2024: normal esophagus, minimally polypoid gastric mucosa, normal duodenum   Colonoscopy Oct 2024: hemorrhoids, pancolonic diverticulosis.   Past Medical History:  Diagnosis Date   Arthritis    Chronic sinusitis    Dry eye    Generalized headaches    GERD (gastroesophageal reflux disease)    Hypothyroidism    Laryngopharyngeal reflux (LPR)    Meibomian gland disease    Murmur    OSA (obstructive sleep apnea)    Retinal vein occlusion    Sensorineural hearing loss    TMJ arthralgia     Past Surgical History:  Procedure Laterality Date   ANTERIOR CERVICAL DECOMP/DISCECTOMY FUSION N/A 08/28/2017   Procedure: ANTERIOR CERVICAL DECOMPRESSION AND FUSION CERVICAL FIVE-SIX,CERVICAL SIX-SEVEN;  Surgeon: Louis Shove, MD;  Location: [facility];  Service: Neurosurgery;  Laterality: N/A;  anterior   BIOPSY  01/24/2020   Procedure: BIOPSY;  Surgeon: Shaaron Lamar HERO, MD;  Location: [facility];  Service: Endoscopy;;  gastric polyp; gastric mucosa    CARPAL TUNNEL RELEASE Right 2001   CATARACT EXTRACTION W/PHACO Right 08/08/2017   Procedure: CATARACT EXTRACTION PHACO AND INTRAOCULAR LENS PLACEMENT (IOC);  Surgeon: Roz Anes, MD;  Location: [facility];  Service: Ophthalmology;  Laterality: Right;  CDE: 4.18   CATARACT EXTRACTION W/PHACO Left 11/10/2017   Procedure: CATARACT EXTRACTION PHACO AND INTRAOCULAR LENS PLACEMENT (IOC);  Surgeon: Roz Anes, MD;  Location: [facility];  Service: Ophthalmology;  Laterality: Left;  CDE: 3.27   CHOLECYSTECTOMY     COLONOSCOPY     COLONOSCOPY N/A 04/03/2020   Procedure: COLONOSCOPY;  Surgeon: Shaaron Lamar HERO, MD;  Location: [facility];  Service: Endoscopy;  Laterality: N/A;  10:00am   COLONOSCOPY WITH PROPOFOL  N/A 09/14/2023   Procedure: COLONOSCOPY WITH PROPOFOL ;  Surgeon: Shaaron Lamar HERO, MD;  Location: [facility];  Service: Endoscopy;  Laterality: N/A;  9:00 am, asa 2   CYST EXCISION     right eye   ESOPHAGOGASTRODUODENOSCOPY N/A 01/24/2020   Procedure: ESOPHAGOGASTRODUODENOSCOPY (EGD);  Surgeon: Shaaron Lamar HERO, MD;  Location: [facility];  Service: Endoscopy;  Laterality: N/A;  2:00pm   ESOPHAGOGASTRODUODENOSCOPY (EGD) WITH PROPOFOL  N/A 09/14/2023   Procedure: ESOPHAGOGASTRODUODENOSCOPY (EGD) WITH PROPOFOL ;  Surgeon: Shaaron Lamar HERO, MD;  Location: [facility];  Service: Endoscopy;  Laterality: N/A;   EYE SURGERY     HAMMER TOE SURGERY Bilateral    IR RADIOLOGIST EVAL & MGMT  07/14/2021   NASAL/SINUS ENDOSCOPY  TUBAL LIGATION     tubal reversal  1991    Current Outpatient Medications  Medication Sig Dispense Refill   acetaminophen  (TYLENOL ) 500 MG tablet Take 1,000 mg by mouth as needed for moderate pain or headache.     Calcium Carb-Cholecalciferol (CALCIUM 600+D3 PO) Take 2 tablets by mouth every morning.      Cyanocobalamin  (B-12 COMPLIANCE INJECTION) 1000 MCG/ML KIT Inject 1,000 mcg as directed every 30 (thirty) days.     docusate sodium  (COLACE) 100 MG capsule Take 200 mg by  mouth at bedtime.     esomeprazole (NEXIUM) 40 MG capsule Take 40 mg by mouth 2 (two) times daily.     famotidine  (PEPCID ) 40 MG tablet Take 1 tablet (40 mg total) by mouth at bedtime. 90 tablet 3   ferrous sulfate 324 MG TBEC Take 324 mg by mouth daily.     Fezolinetant (VEOZAH) 45 MG TABS Take by mouth.     fluticasone (FLONASE) 50 MCG/ACT nasal spray Place 1 spray into both nostrils 2 (two) times daily.     hydrocortisone  (ANUSOL -HC) 2.5 % rectal cream Place 1 Application rectally 2 (two) times daily as needed for hemorrhoids or anal itching. 90 g 3   Hypertonic Nasal Wash (SINUS RINSE NA) Place 1 each into the nose 2 (two) times daily.     levothyroxine  (SYNTHROID , LEVOTHROID) 50 MCG tablet Take 50 mcg by mouth daily before breakfast.     lubiprostone (AMITIZA) 24 MCG capsule Take 24 mcg by mouth 2 (two) times daily with a meal.     ondansetron  (ZOFRAN -ODT) 4 MG disintegrating tablet Take 4 mg by mouth every 8 (eight) hours as needed.     predniSONE  (DELTASONE ) 5 MG tablet Take 5 mg by mouth daily with breakfast.     pregabalin (LYRICA) 100 MG capsule Take 100 mg by mouth daily.     Probiotic Product (PROBIOTIC DAILY) CAPS Take 1 capsule by mouth daily.     promethazine  (PHENERGAN ) 12.5 MG suppository      triamcinolone cream (KENALOG) 0.1 % Apply 1 Application topically as needed.     No current facility-administered medications for this visit.    Allergies as of 10/24/2024 - Review Complete 10/24/2024  Allergen Reaction Noted   Zithromax [azithromycin] Hives and Rash 08/02/2017   Lifitegrast Itching 05/25/2018    Family History  Problem Relation Age of Onset   Other Mother    Other Father    Colon cancer Neg Hx     Social History   Socioeconomic History   Marital status: Married    Spouse name: Not on file   Number of children: Not on file   Years of education: Not on file   Highest education level: Not on file  Occupational History   Not on file  Tobacco Use    Smoking status: Never    Passive exposure: Never   Smokeless tobacco: Never  Vaping Use   Vaping status: Never Used  Substance and Sexual Activity   Alcohol  use: No   Drug use: No   Sexual activity: Never  Other Topics Concern   Not on file  Social History Narrative   Not on file   Social Drivers of Health   Tobacco Use: Low Risk (10/23/2024)   Received from [facility]   Patient History    Smoking Tobacco Use: Never    Smokeless Tobacco Use: Never    Passive Exposure: Not on file  Financial Resource Strain: Low Risk (07/24/2023)  Received from [facility]   Overall Financial Resource Strain (CARDIA)    Difficulty of Paying Living Expenses: Not hard at all  Food Insecurity: Low Risk (10/21/2024)   Received from [facility]   Epic    Within the past 12 months, you worried that your food would run out before you got money to buy more: Never true    Within the past 12 months, the food you bought just didn't last and you didn't have money to get more. : Never true  Transportation Needs: No Transportation Needs (10/21/2024)   Received from [facility]    In the past 12 months, has lack of reliable transportation kept you from medical appointments, meetings, work or from getting things needed for daily living? : No  Physical Activity: Sufficiently Active (01/25/2023)   Received from [facility]   Exercise Vital Sign    On average, how many days per week do you engage in moderate to strenuous exercise (like a brisk walk)?: 5 days    On average, how many minutes do you engage in exercise at this level?: 40 min  Stress: No Stress Concern Present (01/25/2023)   Received from [facility] - Occupational Stress Questionnaire    Feeling of Stress : Not at all  Social Connections: Not on file  Intimate Partner Violence: Not At Risk (01/25/2023)   Received from [facility]   Epic    Within the last year, have  you been afraid of your partner or ex-partner?: No    Within the last year, have you been humiliated or emotionally abused in other ways by your partner or ex-partner?: No    Within the last year, have you been kicked, hit, slapped, or otherwise physically hurt by your partner or ex-partner?: No    Within the last year, have you been raped or forced to have any kind of sexual activity by your partner or ex-partner?: No  Depression (PHQ2-9): Not on file  Alcohol  Screen: Not on file  Housing: Low Risk (10/21/2024)   Received from [facility]   Epic    What is your living situation today?: I have a steady place to live    Think about the place you live. Do you have problems with any of the following? Choose all that apply:: None/None on this list  Utilities: Low Risk (10/21/2024)   Received from [facility]   Utilities    In the past 12 months has the electric, gas, oil, or water  company threatened to shut off services in your home? : No  Health Literacy: Low Risk (01/25/2023)   Received from [facility] Literacy    How often do you need to have someone help you when you read instructions, pamphlets, or other written material from your doctor or pharmacy?: Never     Review of Systems   Gen: Denies any fever, chills, fatigue, weight loss, lack of appetite.  CV: Denies chest pain, heart palpitations, peripheral edema, syncope.  Resp: Denies shortness of breath at rest or with exertion. Denies wheezing or cough.  GI: Denies dysphagia or odynophagia. Denies jaundice, hematemesis, fecal incontinence. GU : Denies urinary burning, urinary frequency, urinary hesitancy MS: Denies joint pain, muscle weakness, cramps, or limitation of movement.  Derm: Denies rash, itching, dry skin Psych: Denies depression, anxiety, memory loss, and confusion Heme: Denies bruising, bleeding, and enlarged lymph nodes.   Physical  Exam   BP 135/80   Pulse 67   Temp 98.6 F (37 C)   Ht 5' 3 (1.6  m)   Wt 147 lb 12.8 oz (67 kg)   BMI 26.18 kg/m  General:   Alert and oriented. Pleasant and cooperative. Well-nourished and well-developed.  Head:  Normocephalic and atraumatic. Eyes:  Without icterus Abdomen:  +BS, soft, non-tender and non-distended. No HSM noted. No guarding or rebound. No masses appreciated.  Rectal:  Deferred  Msk:  Symmetrical without gross deformities. Normal posture. Extremities:  Without edema. Neurologic:  Alert and  oriented x4;  grossly normal neurologically. Skin:  Intact without significant lesions or rashes. Psych:  Alert and cooperative. Normal mood and affect.   Assessment   GERD managed with once daily dosing PPI  Constipation doing well with novel dosing of once daily Amitiza 24 mcg  Gas due to dietary intake, no alarm features   PLAN   Continue nexium daily, famotidine  at night Amitiza novel dosing 24 mcg daily FODMAP trial 1 year return or sooner if needed   Therisa MICAEL Stager, PhD, ANP-BC [facility]

## 2024-10-31 DIAGNOSIS — H35372 Puckering of macula, left eye: Secondary | ICD-10-CM | POA: Diagnosis not present

## 2024-10-31 DIAGNOSIS — H43812 Vitreous degeneration, left eye: Secondary | ICD-10-CM | POA: Diagnosis not present

## 2024-10-31 DIAGNOSIS — H35041 Retinal micro-aneurysms, unspecified, right eye: Secondary | ICD-10-CM | POA: Diagnosis not present

## 2024-10-31 DIAGNOSIS — H18463 Peripheral corneal degeneration, bilateral: Secondary | ICD-10-CM | POA: Diagnosis not present

## 2024-10-31 DIAGNOSIS — H348112 Central retinal vein occlusion, right eye, stable: Secondary | ICD-10-CM | POA: Diagnosis not present

## 2024-10-31 DIAGNOSIS — H04123 Dry eye syndrome of bilateral lacrimal glands: Secondary | ICD-10-CM | POA: Diagnosis not present

## 2024-10-31 DIAGNOSIS — H3561 Retinal hemorrhage, right eye: Secondary | ICD-10-CM | POA: Diagnosis not present
